# Patient Record
Sex: Male | Born: 1945 | Race: Black or African American | Hispanic: No | Marital: Single
Health system: Southern US, Academic
[De-identification: ages and names within clinical notes are randomized; demographics above are authoritative.]

## PROBLEM LIST (undated history)

## (undated) ENCOUNTER — Encounter: Attending: Nurse Practitioner | Primary: Nurse Practitioner

## (undated) ENCOUNTER — Telehealth
Attending: Student in an Organized Health Care Education/Training Program | Primary: Student in an Organized Health Care Education/Training Program

## (undated) ENCOUNTER — Ambulatory Visit

## (undated) ENCOUNTER — Encounter: Attending: Internal Medicine | Primary: Internal Medicine

## (undated) ENCOUNTER — Encounter

## (undated) ENCOUNTER — Ambulatory Visit: Attending: Family | Primary: Family

## (undated) ENCOUNTER — Telehealth: Attending: Nurse Practitioner | Primary: Nurse Practitioner

## (undated) ENCOUNTER — Ambulatory Visit: Payer: MEDICARE

## (undated) ENCOUNTER — Encounter: Attending: Hematology & Oncology | Primary: Hematology & Oncology

## (undated) ENCOUNTER — Telehealth: Attending: Hematology & Oncology | Primary: Hematology & Oncology

## (undated) ENCOUNTER — Telehealth

## (undated) ENCOUNTER — Encounter
Attending: Student in an Organized Health Care Education/Training Program | Primary: Student in an Organized Health Care Education/Training Program

## (undated) ENCOUNTER — Ambulatory Visit
Payer: MEDICARE | Attending: Student in an Organized Health Care Education/Training Program | Primary: Student in an Organized Health Care Education/Training Program

## (undated) ENCOUNTER — Inpatient Hospital Stay

## (undated) ENCOUNTER — Ambulatory Visit: Payer: MEDICARE | Attending: Registered" | Primary: Registered"

## (undated) ENCOUNTER — Ambulatory Visit: Attending: Clinical | Primary: Clinical

## (undated) ENCOUNTER — Telehealth: Attending: Children | Primary: Children

## (undated) ENCOUNTER — Telehealth: Attending: Nephrology | Primary: Nephrology

## (undated) ENCOUNTER — Ambulatory Visit: Payer: MEDICARE | Attending: Nurse Practitioner | Primary: Nurse Practitioner

## (undated) ENCOUNTER — Encounter: Attending: Clinical | Primary: Clinical

## (undated) ENCOUNTER — Encounter: Attending: Mental Health | Primary: Mental Health

## (undated) DIAGNOSIS — K2970 Gastritis, unspecified, without bleeding: Secondary | ICD-10-CM

## (undated) DIAGNOSIS — D649 Anemia, unspecified: Secondary | ICD-10-CM

## (undated) DIAGNOSIS — K269 Duodenal ulcer, unspecified as acute or chronic, without hemorrhage or perforation: Secondary | ICD-10-CM

## (undated) DIAGNOSIS — D509 Iron deficiency anemia, unspecified: Secondary | ICD-10-CM

## (undated) DIAGNOSIS — W3400XA Accidental discharge from unspecified firearms or gun, initial encounter: Secondary | ICD-10-CM

## (undated) DIAGNOSIS — K439 Ventral hernia without obstruction or gangrene: Secondary | ICD-10-CM

## (undated) DIAGNOSIS — K5790 Diverticulosis of intestine, part unspecified, without perforation or abscess without bleeding: Secondary | ICD-10-CM

## (undated) DIAGNOSIS — I10 Essential (primary) hypertension: Secondary | ICD-10-CM

## (undated) DIAGNOSIS — K922 Gastrointestinal hemorrhage, unspecified: Secondary | ICD-10-CM

## (undated) DIAGNOSIS — K2981 Duodenitis with bleeding: Secondary | ICD-10-CM

## (undated) HISTORY — PX: FACIAL RECONSTRUCTION SURGERY: SHX631

## (undated) HISTORY — PX: TRACHEOSTOMY: SUR1362

## (undated) HISTORY — PX: OTHER SURGICAL HISTORY: SHX169

---

## 2014-02-04 ENCOUNTER — Emergency Department (HOSPITAL_COMMUNITY)

## 2014-02-04 ENCOUNTER — Encounter (HOSPITAL_COMMUNITY): Payer: Self-pay | Admitting: Emergency Medicine

## 2014-02-04 ENCOUNTER — Emergency Department (HOSPITAL_COMMUNITY)
Admission: EM | Admit: 2014-02-04 | Discharge: 2014-02-04 | Disposition: A | Discharge status: Home / Self Care | Attending: Emergency Medicine | Admitting: Emergency Medicine

## 2014-02-04 DIAGNOSIS — Z9889 Other specified postprocedural states: Secondary | ICD-10-CM | POA: Insufficient documentation

## 2014-02-04 DIAGNOSIS — R109 Unspecified abdominal pain: Secondary | ICD-10-CM | POA: Diagnosis present

## 2014-02-04 DIAGNOSIS — Z87828 Personal history of other (healed) physical injury and trauma: Secondary | ICD-10-CM | POA: Diagnosis not present

## 2014-02-04 DIAGNOSIS — K439 Ventral hernia without obstruction or gangrene: Secondary | ICD-10-CM | POA: Insufficient documentation

## 2014-02-04 DIAGNOSIS — I10 Essential (primary) hypertension: Secondary | ICD-10-CM | POA: Diagnosis not present

## 2014-02-04 DIAGNOSIS — Z87891 Personal history of nicotine dependence: Secondary | ICD-10-CM | POA: Diagnosis not present

## 2014-02-04 DIAGNOSIS — Z79899 Other long term (current) drug therapy: Secondary | ICD-10-CM | POA: Insufficient documentation

## 2014-02-04 DIAGNOSIS — Z880 Allergy status to penicillin: Secondary | ICD-10-CM | POA: Insufficient documentation

## 2014-02-04 HISTORY — DX: Accidental discharge from unspecified firearms or gun, initial encounter: W34.00XA

## 2014-02-04 HISTORY — DX: Essential (primary) hypertension: I10

## 2014-02-04 LAB — CBC WITH DIFFERENTIAL/PLATELET
Basophils Absolute: 0 10*3/uL (ref 0.0–0.1)
Basophils Relative: 0 % (ref 0–1)
EOS PCT: 1 % (ref 0–5)
Eosinophils Absolute: 0.1 10*3/uL (ref 0.0–0.7)
HCT: 34.3 % — ABNORMAL LOW (ref 39.0–52.0)
HEMOGLOBIN: 11.9 g/dL — AB (ref 13.0–17.0)
LYMPHS ABS: 1.7 10*3/uL (ref 0.7–4.0)
Lymphocytes Relative: 16 % (ref 12–46)
MCH: 30.1 pg (ref 26.0–34.0)
MCHC: 34.7 g/dL (ref 30.0–36.0)
MCV: 86.8 fL (ref 78.0–100.0)
MONOS PCT: 7 % (ref 3–12)
Monocytes Absolute: 0.7 10*3/uL (ref 0.1–1.0)
Neutro Abs: 7.9 10*3/uL — ABNORMAL HIGH (ref 1.7–7.7)
Neutrophils Relative %: 76 % (ref 43–77)
PLATELETS: 307 10*3/uL (ref 150–400)
RBC: 3.95 MIL/uL — AB (ref 4.22–5.81)
RDW: 12.8 % (ref 11.5–15.5)
WBC: 10.5 10*3/uL (ref 4.0–10.5)

## 2014-02-04 LAB — BASIC METABOLIC PANEL
Anion gap: 9 (ref 5–15)
BUN: 20 mg/dL (ref 6–23)
CALCIUM: 9.2 mg/dL (ref 8.4–10.5)
CO2: 25 mEq/L (ref 19–32)
CREATININE: 1.27 mg/dL (ref 0.50–1.35)
Chloride: 96 mEq/L (ref 96–112)
GFR calc Af Amer: 66 mL/min — ABNORMAL LOW (ref >90)
GFR, EST NON AFRICAN AMERICAN: 57 mL/min — AB (ref >90)
GLUCOSE: 100 mg/dL — AB (ref 70–99)
Potassium: 4.8 mEq/L (ref 3.7–5.3)
Sodium: 130 mEq/L — ABNORMAL LOW (ref 137–147)

## 2014-02-04 MED ORDER — OXYCODONE-ACETAMINOPHEN 5-325 MG PO TABS: 2.0000 | ORAL_TABLET | ORAL | Status: DC | PRN

## 2014-02-04 MED ORDER — HYDROMORPHONE HCL PF 1 MG/ML IJ SOLN
1.0000 mg | Freq: Once | INTRAMUSCULAR | Status: AC
  Administered 2014-02-04: 1 mg via INTRAVENOUS
  Filled 2014-02-04: qty 1

## 2014-02-04 MED ORDER — SODIUM CHLORIDE 0.9 % IV BOLUS (SEPSIS)
1000.0000 mL | Freq: Once | INTRAVENOUS | Status: AC
  Administered 2014-02-04: 1000 mL via INTRAVENOUS

## 2014-02-04 NOTE — ED Notes (Signed)
Mid abd pain x 2 days with nausea.

## 2014-02-04 NOTE — Discharge Instructions (Signed)
Hernia A hernia happens when an organ inside your body pushes out through a weak spot in your belly (abdominal) wall. Most hernias get worse over time. They can often be pushed back into place (reduced). Surgery may be needed to repair hernias that cannot be pushed into place. HOME CARE  Keep doing normal activities.  Avoid lifting more than 10 pounds (4.5 kilograms).  Cough gently and avoid straining. Over time, these things will:  Increase your hernia size.  Irritate your hernia.  Break down hernia repairs.  Stop smoking.  Do not wear anything tight over your hernia. Do not keep the hernia in with an outside bandage.  Eat food that is high in fiber (fruit, vegetables, whole grains).  Drink enough fluids to keep your pee (urine) clear or pale yellow.  Take medicines to make your poop soft (stool softeners) if you cannot poop (constipated). GET HELP RIGHT AWAY IF:   You have a fever.  You have belly pain that gets worse.  You feel sick to your stomach (nauseous) and throw up (vomit).  Your skin starts to bulge out.  Your hernia turns a different color, feels hard, or is tender.  You have increased pain or puffiness (swelling) around the hernia.  You poop more or less often.  Your poop does not look the way normally does.  You have watery poop (diarrhea).  You cannot push the hernia back in place by applying gentle pressure while lying down. MAKE SURE YOU:   Understand these instructions.  Will watch your condition.  Will get help right away if you are not doing well or get worse. Document Released: 11/03/2009 Document Revised: 08/08/2011 Document Reviewed: 11/03/2009 [facility] Patient Information 2015 [place], [place]. This information is not intended to replace advice given to you by your health care provider. Make sure you discuss any questions you have with your health care provider.  Recommend followup with general surgeon to evaluate hernia. Pain medication.  Return if vomiting or unable to reduce hernia.
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
## 2014-06-12 HISTORY — PX: HERNIA REPAIR: SHX51

## 2014-06-29 ENCOUNTER — Inpatient Hospital Stay (HOSPITAL_COMMUNITY)
Admission: EM | Admit: 2014-06-29 | Discharge: 2014-07-02 | DRG: 378 | Disposition: A | Discharge status: Home / Self Care | Payer: Medicaid Other | Attending: Internal Medicine | Admitting: Internal Medicine

## 2014-06-29 ENCOUNTER — Emergency Department (HOSPITAL_COMMUNITY): Payer: Medicaid Other

## 2014-06-29 ENCOUNTER — Encounter (HOSPITAL_COMMUNITY): Payer: Self-pay | Admitting: Emergency Medicine

## 2014-06-29 DIAGNOSIS — K264 Chronic or unspecified duodenal ulcer with hemorrhage: Secondary | ICD-10-CM | POA: Diagnosis not present

## 2014-06-29 DIAGNOSIS — R109 Unspecified abdominal pain: Secondary | ICD-10-CM | POA: Diagnosis present

## 2014-06-29 DIAGNOSIS — Z880 Allergy status to penicillin: Secondary | ICD-10-CM

## 2014-06-29 DIAGNOSIS — K5730 Diverticulosis of large intestine without perforation or abscess without bleeding: Secondary | ICD-10-CM | POA: Diagnosis present

## 2014-06-29 DIAGNOSIS — D500 Iron deficiency anemia secondary to blood loss (chronic): Secondary | ICD-10-CM | POA: Diagnosis not present

## 2014-06-29 DIAGNOSIS — Z23 Encounter for immunization: Secondary | ICD-10-CM | POA: Diagnosis not present

## 2014-06-29 DIAGNOSIS — K2981 Duodenitis with bleeding: Principal | ICD-10-CM | POA: Diagnosis present

## 2014-06-29 DIAGNOSIS — K2970 Gastritis, unspecified, without bleeding: Secondary | ICD-10-CM | POA: Diagnosis present

## 2014-06-29 DIAGNOSIS — K5790 Diverticulosis of intestine, part unspecified, without perforation or abscess without bleeding: Secondary | ICD-10-CM | POA: Diagnosis present

## 2014-06-29 DIAGNOSIS — D649 Anemia, unspecified: Secondary | ICD-10-CM | POA: Insufficient documentation

## 2014-06-29 DIAGNOSIS — D62 Acute posthemorrhagic anemia: Secondary | ICD-10-CM | POA: Diagnosis present

## 2014-06-29 DIAGNOSIS — K922 Gastrointestinal hemorrhage, unspecified: Secondary | ICD-10-CM

## 2014-06-29 DIAGNOSIS — K439 Ventral hernia without obstruction or gangrene: Secondary | ICD-10-CM | POA: Diagnosis present

## 2014-06-29 DIAGNOSIS — Z87891 Personal history of nicotine dependence: Secondary | ICD-10-CM

## 2014-06-29 DIAGNOSIS — R1033 Periumbilical pain: Secondary | ICD-10-CM | POA: Diagnosis not present

## 2014-06-29 DIAGNOSIS — K2980 Duodenitis without bleeding: Secondary | ICD-10-CM | POA: Diagnosis present

## 2014-06-29 DIAGNOSIS — Z79899 Other long term (current) drug therapy: Secondary | ICD-10-CM | POA: Diagnosis not present

## 2014-06-29 DIAGNOSIS — K269 Duodenal ulcer, unspecified as acute or chronic, without hemorrhage or perforation: Secondary | ICD-10-CM | POA: Diagnosis present

## 2014-06-29 DIAGNOSIS — K648 Other hemorrhoids: Secondary | ICD-10-CM | POA: Diagnosis present

## 2014-06-29 DIAGNOSIS — Z9889 Other specified postprocedural states: Secondary | ICD-10-CM | POA: Diagnosis not present

## 2014-06-29 DIAGNOSIS — D509 Iron deficiency anemia, unspecified: Secondary | ICD-10-CM | POA: Diagnosis present

## 2014-06-29 DIAGNOSIS — I10 Essential (primary) hypertension: Secondary | ICD-10-CM | POA: Diagnosis present

## 2014-06-29 HISTORY — DX: Duodenal ulcer, unspecified as acute or chronic, without hemorrhage or perforation: K26.9

## 2014-06-29 HISTORY — DX: Ventral hernia without obstruction or gangrene: K43.9

## 2014-06-29 HISTORY — DX: Iron deficiency anemia, unspecified: D50.9

## 2014-06-29 HISTORY — DX: Gastrointestinal hemorrhage, unspecified: K92.2

## 2014-06-29 HISTORY — DX: Gastritis, unspecified, without bleeding: K29.70

## 2014-06-29 HISTORY — DX: Anemia, unspecified: D64.9

## 2014-06-29 HISTORY — DX: Duodenitis with bleeding: K29.81

## 2014-06-29 HISTORY — DX: Diverticulosis of intestine, part unspecified, without perforation or abscess without bleeding: K57.90

## 2014-06-29 LAB — RETICULOCYTES
RBC.: 2.99 MIL/uL — ABNORMAL LOW (ref 4.22–5.81)
RETIC CT PCT: 1.5 % (ref 0.4–3.1)
Retic Count, Absolute: 44.9 10*3/uL (ref 19.0–186.0)

## 2014-06-29 LAB — CBC WITH DIFFERENTIAL/PLATELET
BASOS ABS: 0 10*3/uL (ref 0.0–0.1)
Basophils Relative: 0 % (ref 0–1)
EOS ABS: 0 10*3/uL (ref 0.0–0.7)
Eosinophils Relative: 1 % (ref 0–5)
HCT: 25.1 % — ABNORMAL LOW (ref 39.0–52.0)
HEMOGLOBIN: 8.2 g/dL — AB (ref 13.0–17.0)
Lymphocytes Relative: 26 % (ref 12–46)
Lymphs Abs: 1.4 10*3/uL (ref 0.7–4.0)
MCH: 27.4 pg (ref 26.0–34.0)
MCHC: 32.7 g/dL (ref 30.0–36.0)
MCV: 83.9 fL (ref 78.0–100.0)
Monocytes Absolute: 0.3 10*3/uL (ref 0.1–1.0)
Monocytes Relative: 5 % (ref 3–12)
NEUTROS ABS: 3.6 10*3/uL (ref 1.7–7.7)
Neutrophils Relative %: 68 % (ref 43–77)
PLATELETS: 432 10*3/uL — AB (ref 150–400)
RBC: 2.99 MIL/uL — AB (ref 4.22–5.81)
RDW: 15 % (ref 11.5–15.5)
WBC: 5.3 10*3/uL (ref 4.0–10.5)

## 2014-06-29 LAB — I-STAT CG4 LACTIC ACID, ED: Lactic Acid, Venous: 0.46 mmol/L — ABNORMAL LOW (ref 0.5–2.0)

## 2014-06-29 LAB — LIPASE, BLOOD: Lipase: 20 U/L (ref 11–59)

## 2014-06-29 LAB — COMPREHENSIVE METABOLIC PANEL
ALT: 14 U/L (ref 0–53)
AST: 13 U/L (ref 0–37)
Albumin: 3.5 g/dL (ref 3.5–5.2)
Alkaline Phosphatase: 63 U/L (ref 39–117)
Anion gap: 4 — ABNORMAL LOW (ref 5–15)
BUN: 10 mg/dL (ref 6–23)
CALCIUM: 8.8 mg/dL (ref 8.4–10.5)
CO2: 27 mmol/L (ref 19–32)
CREATININE: 1.1 mg/dL (ref 0.50–1.35)
Chloride: 100 mmol/L (ref 96–112)
GFR calc Af Amer: 79 mL/min — ABNORMAL LOW (ref >90)
GFR calc non Af Amer: 68 mL/min — ABNORMAL LOW (ref >90)
GLUCOSE: 110 mg/dL — AB (ref 70–99)
POTASSIUM: 3.7 mmol/L (ref 3.5–5.1)
SODIUM: 131 mmol/L — AB (ref 135–145)
Total Bilirubin: 0.6 mg/dL (ref 0.3–1.2)
Total Protein: 7.2 g/dL (ref 6.0–8.3)

## 2014-06-29 LAB — SAMPLE TO BLOOD BANK

## 2014-06-29 MED ORDER — SODIUM CHLORIDE 0.9 % IV SOLN: 8.0000 mg/h | INTRAVENOUS | Status: DC

## 2014-06-29 MED ORDER — PANTOPRAZOLE SODIUM 40 MG IV SOLR
40.0000 mg | Freq: Two times a day (BID) | INTRAVENOUS | Status: DC
  Administered 2014-06-29 – 2014-07-01 (×4): 40 mg via INTRAVENOUS
  Filled 2014-06-29 (×4): qty 40

## 2014-06-29 MED ORDER — SODIUM CHLORIDE 0.9 % IV SOLN
INTRAVENOUS | Status: DC
  Administered 2014-06-29 – 2014-07-02 (×3): via INTRAVENOUS

## 2014-06-29 MED ORDER — ONDANSETRON HCL 4 MG/2ML IJ SOLN: 4.0000 mg | Freq: Four times a day (QID) | INTRAMUSCULAR | Status: DC | PRN

## 2014-06-29 MED ORDER — IOHEXOL 300 MG/ML  SOLN
50.0000 mL | Freq: Once | INTRAMUSCULAR | Status: AC | PRN
  Administered 2014-06-29: 50 mL via ORAL

## 2014-06-29 MED ORDER — SODIUM CHLORIDE 0.9 % IV SOLN: INTRAVENOUS | Status: DC

## 2014-06-29 MED ORDER — ONDANSETRON HCL 4 MG/2ML IJ SOLN
4.0000 mg | INTRAMUSCULAR | Status: DC | PRN
  Administered 2014-06-29: 4 mg via INTRAVENOUS
  Filled 2014-06-29: qty 2

## 2014-06-29 MED ORDER — ATENOLOL 25 MG PO TABS
25.0000 mg | ORAL_TABLET | Freq: Every day | ORAL | Status: DC
  Administered 2014-06-30 – 2014-07-02 (×3): 25 mg via ORAL
  Filled 2014-06-29 (×3): qty 1

## 2014-06-29 MED ORDER — HYDROCHLOROTHIAZIDE 25 MG PO TABS: 25.0000 mg | ORAL_TABLET | Freq: Every day | ORAL | Status: DC

## 2014-06-29 MED ORDER — HYDROCHLOROTHIAZIDE 25 MG PO TABS
25.0000 mg | ORAL_TABLET | Freq: Every day | ORAL | Status: DC
  Administered 2014-06-30 – 2014-07-02 (×3): 25 mg via ORAL
  Filled 2014-06-29 (×3): qty 1

## 2014-06-29 MED ORDER — ONDANSETRON HCL 4 MG PO TABS: 4.0000 mg | ORAL_TABLET | Freq: Four times a day (QID) | ORAL | Status: DC | PRN

## 2014-06-29 MED ORDER — MORPHINE SULFATE 4 MG/ML IJ SOLN
4.0000 mg | INTRAMUSCULAR | Status: DC | PRN
  Administered 2014-06-29: 4 mg via INTRAVENOUS
  Filled 2014-06-29: qty 1

## 2014-06-29 MED ORDER — SODIUM CHLORIDE 0.9 % IV SOLN
80.0000 mg | Freq: Once | INTRAVENOUS | Status: AC
  Administered 2014-06-29: 80 mg via INTRAVENOUS
  Filled 2014-06-29: qty 80

## 2014-06-29 MED ORDER — PNEUMOCOCCAL VAC POLYVALENT 25 MCG/0.5ML IJ INJ
0.5000 mL | INJECTION | INTRAMUSCULAR | Status: AC
  Administered 2014-06-30: 0.5 mL via INTRAMUSCULAR
  Filled 2014-06-29: qty 0.5

## 2014-06-29 MED ORDER — PANTOPRAZOLE SODIUM 40 MG IV SOLR
INTRAVENOUS | Status: AC
  Filled 2014-06-29: qty 80

## 2014-06-29 MED ORDER — HYOSCYAMINE SULFATE 0.125 MG PO TABS
0.2500 mg | ORAL_TABLET | Freq: Two times a day (BID) | ORAL | Status: DC
  Administered 2014-06-29 – 2014-07-01 (×4): 0.25 mg via ORAL
  Filled 2014-06-29 (×6): qty 2

## 2014-06-29 MED ORDER — DOCUSATE SODIUM 100 MG PO CAPS
100.0000 mg | ORAL_CAPSULE | Freq: Two times a day (BID) | ORAL | Status: DC
Start: 2014-06-29 — End: 2014-07-02
  Administered 2014-06-29 – 2014-07-02 (×6): 100 mg via ORAL
  Filled 2014-06-29 (×6): qty 1

## 2014-06-29 MED ORDER — IOHEXOL 300 MG/ML  SOLN
100.0000 mL | Freq: Once | INTRAMUSCULAR | Status: AC | PRN
  Administered 2014-06-29: 100 mL via INTRAVENOUS

## 2014-06-29 NOTE — ED Notes (Signed)
Pt reports return of post surgical pain after he was taken off his pain meds on Tues.
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
## 2014-06-29 NOTE — ED Notes (Addendum)
Pt finished drinking contrast. CT aware. CT reports is bringing one additional bottle of contrast.

## 2014-06-29 NOTE — ED Notes (Signed)
CT aware pt finished drinking second bottle of oral contrast. CT reports pt to be scanned 1745.

## 2014-06-29 NOTE — ED Notes (Signed)
POC occult blood obtained by EDP provider. POC occult blood negative.
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
## 2014-06-30 ENCOUNTER — Encounter (HOSPITAL_COMMUNITY): Payer: Self-pay | Admitting: Gastroenterology

## 2014-06-30 DIAGNOSIS — D509 Iron deficiency anemia, unspecified: Secondary | ICD-10-CM | POA: Diagnosis present

## 2014-06-30 DIAGNOSIS — I10 Essential (primary) hypertension: Secondary | ICD-10-CM | POA: Diagnosis present

## 2014-06-30 DIAGNOSIS — R109 Unspecified abdominal pain: Secondary | ICD-10-CM | POA: Diagnosis present

## 2014-06-30 LAB — COMPREHENSIVE METABOLIC PANEL
ALT: 11 U/L (ref 0–53)
ANION GAP: 5 (ref 5–15)
AST: 8 U/L (ref 0–37)
Albumin: 3 g/dL — ABNORMAL LOW (ref 3.5–5.2)
Alkaline Phosphatase: 57 U/L (ref 39–117)
BILIRUBIN TOTAL: 0.5 mg/dL (ref 0.3–1.2)
BUN: 12 mg/dL (ref 6–23)
CALCIUM: 8.6 mg/dL (ref 8.4–10.5)
CO2: 26 mmol/L (ref 19–32)
Chloride: 102 mmol/L (ref 96–112)
Creatinine, Ser: 1.19 mg/dL (ref 0.50–1.35)
GFR calc Af Amer: 72 mL/min — ABNORMAL LOW (ref >90)
GFR calc non Af Amer: 62 mL/min — ABNORMAL LOW (ref >90)
GLUCOSE: 103 mg/dL — AB (ref 70–99)
Potassium: 4.1 mmol/L (ref 3.5–5.1)
SODIUM: 133 mmol/L — AB (ref 135–145)
TOTAL PROTEIN: 6.3 g/dL (ref 6.0–8.3)

## 2014-06-30 LAB — FERRITIN: FERRITIN: 8 ng/mL — AB (ref 22–322)

## 2014-06-30 LAB — IRON AND TIBC
IRON: 66 ug/dL (ref 42–165)
Saturation Ratios: 19 % — ABNORMAL LOW (ref 20–55)
TIBC: 356 ug/dL (ref 215–435)
UIBC: 290 ug/dL (ref 125–400)

## 2014-06-30 LAB — CBC
HCT: 23.2 % — ABNORMAL LOW (ref 39.0–52.0)
Hemoglobin: 7.6 g/dL — ABNORMAL LOW (ref 13.0–17.0)
MCH: 27.5 pg (ref 26.0–34.0)
MCHC: 32.8 g/dL (ref 30.0–36.0)
MCV: 84.1 fL (ref 78.0–100.0)
Platelets: 394 10*3/uL (ref 150–400)
RBC: 2.76 MIL/uL — ABNORMAL LOW (ref 4.22–5.81)
RDW: 15.1 % (ref 11.5–15.5)
WBC: 5 10*3/uL (ref 4.0–10.5)

## 2014-06-30 LAB — FOLATE: FOLATE: 8.6 ng/mL

## 2014-06-30 LAB — VITAMIN B12: VITAMIN B 12: 386 pg/mL (ref 211–911)

## 2014-06-30 LAB — ABO/RH: ABO/RH(D): A POS

## 2014-06-30 LAB — PREPARE RBC (CROSSMATCH)

## 2014-06-30 MED ORDER — SODIUM CHLORIDE 0.9 % IV SOLN
Freq: Once | INTRAVENOUS | Status: AC
  Administered 2014-06-30: 16:00:00 via INTRAVENOUS

## 2014-06-30 MED ORDER — POLYETHYLENE GLYCOL 3350 17 G PO PACK
17.0000 g | PACK | Freq: Two times a day (BID) | ORAL | Status: AC
  Administered 2014-06-30 – 2014-07-01 (×4): 17 g via ORAL
  Filled 2014-06-30 (×4): qty 1

## 2014-06-30 MED ORDER — PEG 3350-KCL-NA BICARB-NACL 420 G PO SOLR
2000.0000 mL | Freq: Once | ORAL | Status: AC
  Administered 2014-06-30: 2000 mL via ORAL
  Filled 2014-06-30: qty 4000

## 2014-06-30 MED ORDER — PEG 3350-KCL-NA BICARB-NACL 420 G PO SOLR
2000.0000 mL | Freq: Once | ORAL | Status: AC
  Administered 2014-07-01: 2000 mL via ORAL

## 2014-06-30 NOTE — Progress Notes (Signed)
[email or web address] MD notified, pt's Hgb 7.6 this morning.

## 2014-06-30 NOTE — Care Management Note (Addendum)
    Page 1 of 1   07/02/2014     2:06:59 PM CARE MANAGEMENT NOTE 07/02/2014  Patient:  Jeffery Clark,Jeffery Clark   Account Number:  [id number]  Date Initiated:  06/30/2014  Documentation initiated by:  Sharrie RothmanBLACKWELL,Nicolette Gieske C  Subjective/Objective Assessment:   [facility] with abd [facility]. Pt will return to facility at discharge.     Action/Plan:   No CM needs noted. Will call prison RN at discharge for clearance to return to facility.   Anticipated DC Date:  07/03/2014   Anticipated DC Plan:  [facility]      DC Planning Services  CM consult      Choice offered to / List presented to:             Status of service:  Completed, signed off Medicare Important Message given?   (If response is "NO", the following Medicare IM given date fields will be blank) Date Medicare IM given:   Medicare IM given by:   Date Additional Medicare IM given:   Additional Medicare IM given by:    Discharge Disposition:  [facility]  Per UR Regulation:    If discussed at Long Length of Stay Meetings, dates discussed:    Comments:  07/02/14 1400 Arlyss Queenammy Xzaiver Vayda, RN BSN CM Pt to be discharged back to prison today. [facility] and she has given clearance for pt to return to same correctional [facility] today. Discharge summary faxed to prison per their request to get medications filled ASAP. No other CM needs noted.  06/30/14 1230 Arlyss Queenammy Sybrina Laning, RN BSN CM
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
## 2014-06-30 NOTE — Progress Notes (Signed)
[facility] PROGRESS NOTE  Glynda JaegerRoy XXXAtwater AOZ:[id number] DOB: 11-14-1945 DOA: 06/29/2014 PCP: No PCP Per Patient  Assessment/Plan: 1. Duodenits with possible duodenal ulcer. The site of his pain is not quite consistent with duodenal ulcer pain location but the CT abdominal scan finding is clearly abnormal. Pain resolved this afternoon. Will start clear liquids. Continue  intravenous Protonix.  Await GI input 2. Anemia, normocytic. Platelet count elevated. Anemia panel in process. No NSAID use. No melena. No hx colonoscopy. Will transfuse 1 unit PRBC's. Await GI input. 3. HTN: controlled. Home meds include BB and HCTZ. Holding HCTZ  Code Status: full Family Communication: none present Disposition Plan: home when   Consultants:  GI  Procedures:  none  Antibiotics:  none  HPI/Subjective: Feeling better. Denies pain. Requesting liquids  Objective: Filed Vitals:   06/30/14 0624  BP: 111/58  Pulse: 55  Temp: 98.6 F (37 C)  Resp: 20    Intake/Output Summary (Last 24 hours) at 06/30/14 1412 Last data filed at 06/30/14 0800  Gross per 24 hour  Intake 495.83 ml  Output      0 ml  Net 495.83 ml   Filed Weights   06/29/14 1523 06/29/14 2052  Weight: 104.327 kg (230 lb) 100.608 kg (221 lb 12.8 oz)    Exam:   General:  Well nourished comfortable  Cardiovascular: RRR No MGR no LE edema  Respiratory: normal effort BS clear bilaterally  Abdomen: soft +BS non-distended non-tender  Musculoskeletal: no clubbing or cyanosis   Data Reviewed: Basic Metabolic Panel:  Recent Labs Lab 06/29/14 1615 06/30/14 0704  NA 131* 133*  K 3.7 4.1  CL 100 102  CO2 27 26  GLUCOSE 110* 103*  BUN 10 12  CREATININE 1.10 1.19  CALCIUM 8.8 8.6   Liver Function Tests:  Recent Labs Lab 06/29/14 1615 06/30/14 0704  AST 13 8  ALT 14 11  ALKPHOS 63 57  BILITOT 0.6 0.5  PROT 7.2 6.3  ALBUMIN 3.5 3.0*    Recent Labs Lab 06/29/14 1615  LIPASE 20   No results for  input(s): AMMONIA in the last 168 hours. CBC:  Recent Labs Lab 06/29/14 1615 06/30/14 0704  WBC 5.3 5.0  NEUTROABS 3.6  --   HGB 8.2* 7.6*  HCT 25.1* 23.2*  MCV 83.9 84.1  PLT 432* 394   Cardiac Enzymes: No results for input(s): CKTOTAL, CKMB, CKMBINDEX, TROPONINI in the last 168 hours. BNP (last 3 results) No results for input(s): PROBNP in the last 8760 hours. CBG: No results for input(s): GLUCAP in the last 168 hours.  No results found for this or any previous visit (from the past 240 hour(s)).   Studies: Ct Abdomen Pelvis W Contrast  06/29/2014   CLINICAL DATA:  Mid abdominal pain beginning 06/28/2014. Status post umbilical hernia repair is 06/12/2014.  EXAM: CT ABDOMEN AND PELVIS WITH CONTRAST  TECHNIQUE: Multidetector CT imaging of the abdomen and pelvis was performed using the standard protocol following bolus administration of intravenous contrast.  CONTRAST:  50 mL OMNIPAQUE IOHEXOL 300 MG/ML SOLN, 100 mL OMNIPAQUE IOHEXOL 300 MG/ML SOLN  COMPARISON:  None.  FINDINGS: The lung bases are clear.  No pleural or pericardial effusion.  The walls of the first portion the duodenum are markedly thickened with infiltration of surrounding fat. No free air is identified. There is no small bowel obstruction. A large volume of stool is present throughout the colon. The colon is otherwise unremarkable. The appendix appears normal.  The gallbladder, spleen and adrenal  glands appear normal. Small low attenuating lesions in the kidneys bilaterally are identified and likely represents cysts. Some are too small to definitively characterize. A few small low attenuating lesions are also identified in the liver which likely represent cysts.  There is some stranding of fat in the anterior abdominal wall at the umbilicus consistent with history of hernia repair. No fluid collection or other complicating feature is identified. No lymphadenopathy or intra-abdominal fluid is identified.  No lytic or sclerotic  bony lesion is seen. Lower lumbar spondylosis is noted.  IMPRESSION: Marked thickening of the walls of the first portion of the duodenum consistent with duodenitis and possible ulceration. No perforation is seen.  Status post umbilical hernia repair without complicating feature.  Large stool burden throughout the colon.   Electronically Signed   By: Drusilla Kanner M.D.   On: 06/29/2014 18:23    Scheduled Meds: . sodium chloride   Intravenous Once  . atenolol  25 mg Oral Daily  . docusate sodium  100 mg Oral BID  . hydrochlorothiazide  25 mg Oral Daily  . hyoscyamine  0.25 mg Oral BID  . pantoprazole (PROTONIX) IV  40 mg Intravenous Q12H  . polyethylene glycol  17 g Oral BID   Continuous Infusions: . sodium chloride 50 mL/hr at 06/29/14 2045    Active Problems:   Duodenitis   Absolute anemia   Hypertension   Abdominal pain    Time spent: 34 minutes    [facility] M  [facility] Pager [phone]. If 7PM-7AM, please contact night-coverage at [email or web address], password [facility] 06/30/2014, 2:12 PM  LOS: 1 day

## 2014-06-30 NOTE — Progress Notes (Signed)
UR chart review completed.  

## 2014-07-01 ENCOUNTER — Encounter (HOSPITAL_COMMUNITY): Admission: EM | Payer: Self-pay | Source: Home / Self Care | Attending: Internal Medicine

## 2014-07-01 DIAGNOSIS — K922 Gastrointestinal hemorrhage, unspecified: Secondary | ICD-10-CM | POA: Diagnosis present

## 2014-07-01 DIAGNOSIS — D500 Iron deficiency anemia secondary to blood loss (chronic): Secondary | ICD-10-CM

## 2014-07-01 DIAGNOSIS — K269 Duodenal ulcer, unspecified as acute or chronic, without hemorrhage or perforation: Secondary | ICD-10-CM

## 2014-07-01 HISTORY — DX: Gastrointestinal hemorrhage, unspecified: K92.2

## 2014-07-01 HISTORY — DX: Duodenal ulcer, unspecified as acute or chronic, without hemorrhage or perforation: K26.9

## 2014-07-01 HISTORY — PX: ESOPHAGOGASTRODUODENOSCOPY: SHX5428

## 2014-07-01 HISTORY — PX: COLONOSCOPY: SHX5424

## 2014-07-01 LAB — TYPE AND SCREEN
ABO/RH(D): A POS
Antibody Screen: NEGATIVE
UNIT DIVISION: 0

## 2014-07-01 LAB — BASIC METABOLIC PANEL
Anion gap: 7 (ref 5–15)
BUN: 10 mg/dL (ref 6–23)
CALCIUM: 8.6 mg/dL (ref 8.4–10.5)
CO2: 27 mmol/L (ref 19–32)
Chloride: 101 mmol/L (ref 96–112)
Creatinine, Ser: 1.04 mg/dL (ref 0.50–1.35)
GFR calc non Af Amer: 73 mL/min — ABNORMAL LOW (ref >90)
GFR, EST AFRICAN AMERICAN: 84 mL/min — AB (ref >90)
Glucose, Bld: 95 mg/dL (ref 70–99)
Potassium: 4 mmol/L (ref 3.5–5.1)
Sodium: 135 mmol/L (ref 135–145)

## 2014-07-01 LAB — CBC
HCT: 24.9 % — ABNORMAL LOW (ref 39.0–52.0)
Hemoglobin: 8.3 g/dL — ABNORMAL LOW (ref 13.0–17.0)
MCH: 28.2 pg (ref 26.0–34.0)
MCHC: 33.3 g/dL (ref 30.0–36.0)
MCV: 84.7 fL (ref 78.0–100.0)
PLATELETS: 378 10*3/uL (ref 150–400)
RBC: 2.94 MIL/uL — AB (ref 4.22–5.81)
RDW: 15 % (ref 11.5–15.5)
WBC: 4.5 10*3/uL (ref 4.0–10.5)

## 2014-07-01 SURGERY — COLONOSCOPY
Anesthesia: Moderate Sedation

## 2014-07-01 MED ORDER — LIDOCAINE VISCOUS 2 % MT SOLN
10.0000 mL | Freq: Three times a day (TID) | OROMUCOSAL | Status: DC
  Administered 2014-07-02: 10 mL via OROMUCOSAL
  Filled 2014-07-01 (×3): qty 15

## 2014-07-01 MED ORDER — SODIUM CHLORIDE 0.9 % IV SOLN
INTRAVENOUS | Status: DC
  Administered 2014-07-01: 1000 mL via INTRAVENOUS

## 2014-07-01 MED ORDER — STERILE WATER FOR IRRIGATION IR SOLN
Status: DC | PRN
  Administered 2014-07-01: 14:00:00

## 2014-07-01 MED ORDER — MEPERIDINE HCL 100 MG/ML IJ SOLN
INTRAMUSCULAR | Status: DC | PRN
  Administered 2014-07-01 (×3): 25 mg via INTRAVENOUS

## 2014-07-01 MED ORDER — MIDAZOLAM HCL 5 MG/5ML IJ SOLN
INTRAMUSCULAR | Status: AC
  Filled 2014-07-01: qty 10

## 2014-07-01 MED ORDER — PANTOPRAZOLE SODIUM 40 MG PO TBEC
40.0000 mg | DELAYED_RELEASE_TABLET | Freq: Two times a day (BID) | ORAL | Status: DC
  Administered 2014-07-02: 40 mg via ORAL
  Filled 2014-07-01 (×2): qty 1

## 2014-07-01 MED ORDER — LIDOCAINE VISCOUS 2 % MT SOLN
OROMUCOSAL | Status: DC | PRN
  Administered 2014-07-01: 4 mL via OROMUCOSAL
  Administered 2014-07-01: 3 mL via OROMUCOSAL

## 2014-07-01 MED ORDER — HYDROCODONE-ACETAMINOPHEN 5-325 MG PO TABS: 1.0000 | ORAL_TABLET | Freq: Four times a day (QID) | ORAL | Status: DC | PRN

## 2014-07-01 MED ORDER — SODIUM CHLORIDE 0.9 % IJ SOLN
INTRAMUSCULAR | Status: AC
  Filled 2014-07-01: qty 3

## 2014-07-01 MED ORDER — PROMETHAZINE HCL 25 MG/ML IJ SOLN
INTRAMUSCULAR | Status: AC
  Filled 2014-07-01: qty 1

## 2014-07-01 MED ORDER — PROMETHAZINE HCL 25 MG/ML IJ SOLN
INTRAMUSCULAR | Status: DC | PRN
  Administered 2014-07-01: 12.5 mg via INTRAVENOUS

## 2014-07-01 MED ORDER — MEPERIDINE HCL 100 MG/ML IJ SOLN
INTRAMUSCULAR | Status: AC
  Filled 2014-07-01: qty 2

## 2014-07-01 MED ORDER — LIDOCAINE VISCOUS 2 % MT SOLN
OROMUCOSAL | Status: AC
  Filled 2014-07-01: qty 15

## 2014-07-01 MED ORDER — MIDAZOLAM HCL 5 MG/5ML IJ SOLN
INTRAMUSCULAR | Status: DC | PRN
  Administered 2014-07-01 (×2): 1 mg via INTRAVENOUS
  Administered 2014-07-01 (×2): 2 mg via INTRAVENOUS

## 2014-07-01 NOTE — Op Note (Signed)
[facility] [address] [place], [id number]   COLONOSCOPY PROCEDURE REPORT  PATIENT: Jeffery Clark, Jeffery Clark  MR#: [id number] BIRTHDATE: 12-10-1945 , 68  yrs. old GENDER: male ENDOSCOPIST: [place] L Elleah Hemsley, MD REFERRED BY: PROCEDURE DATE:  07/01/2014 PROCEDURE:   Colonoscopy, diagnostic INDICATIONS:anemia, non-specific. MEDICATIONS: Versed 5 mg IV and Demerol 50 mg IV  DESCRIPTION OF PROCEDURE:    Physical exam was performed.  Informed consent was obtained from the patient after explaining the benefits, risks, and alternatives to procedure.  The patient was connected to monitor and placed in left lateral position. Continuous oxygen was provided by nasal cannula and IV medicine administered through an indwelling cannula.  After administration of sedation and rectal exam, the patients rectum was intubated and the EC-[id number] ([id number]  colonoscope was advanced under direct visualization to the cecum.  The scope was removed slowly by carefully examining the color, texture, anatomy, and integrity mucosa on the way out.  The patient was recovered in endoscopy and discharged home in satisfactory condition.    COLON FINDINGS: There was moderate diverticulosis noted throughout the entire examined colon with associated muscular hypertrophy and tortuosity.  , The colon was redundant.  Manual abdominal counter-pressure was used to reach the cecum.  The patient was moved on to their back to reach the cecum, The examination was otherwise normal.  , and Moderate sized internal hemorrhoids were found.  PREP QUALITY: excellent.  CECAL W/D TIME: 14       minutes COMPLICATIONS: None  ENDOSCOPIC IMPRESSION: 1.   Moderate diverticulosis throughout the entire examined colon 2.   The LEFT colon IS Redundant 3.   Moderate sized internal hemorrhoids 4. NO SOURCE FOR ANEMIA IDENTIFIED.  RECOMMENDATIONS: PROCEED TO EGD HIGH FIBER DIET NEXT TCS IN 10 YEARS WITH AN  OVERTUBE    _______________________________ eSigned[place]:  Kristofer Schaffert L Tristain Daily, MD 07/01/2014 2:59 PM    CPT CODES: ICD CODES:  The ICD and CPT codes recommended by this software are interpretations from the data that the clinical staff has captured with the software.  The verification of the translation of this report to the ICD and CPT codes and modifiers is the sole responsibility of the health care institution and practicing physician where this report was generated.  [employer] will not be held responsible for the validity of the ICD and CPT codes included on this report.  AMA assumes no liability for data contained or not contained herein. CPT is a Publishing rights managerregistered trademark of the [employer].

## 2014-07-01 NOTE — Progress Notes (Signed)
[facility] PROGRESS NOTE  Jeffery Clark ZOX:[id number] DOB: 1945-06-03 DOA: 06/29/2014 PCP: No PCP Per Patient  Assessment/Plan: 1. Duodenits with possible duodenal ulcer.  Pain resolved. CT concerning for ulceration. Continue intravenous Protonix. evaluated by GI who recommend colonoscopy and EGD in setting of duodenitis and IDA.  2. Anemia, normocytic.  Anemia panel consistent with IDA. No NSAID use. No melena. No hx colonoscopy. Hg 8.3 s/p 1 unit PRBC's.  Appreciate GI input. 3. HTN: controlled. Home meds include BB and HCTZ. Holding HCTZ  Code Status: full Family Communication: none present Disposition Plan: back to facility   Consultants:  GI  Procedures:  EGD  colonoscopy  Antibiotics:  none  HPI/Subjective: Denies pain/discomfort. No stools.   Objective: Filed Vitals:   07/01/14 0435  BP: 121/63  Pulse: 64  Temp: 98.4 F (36.9 C)  Resp: 18    Intake/Output Summary (Last 24 hours) at 07/01/14 0944 Last data filed at 06/30/14 1830  Gross per 24 hour  Intake    570 ml  Output      0 ml  Net    570 ml   Filed Weights   06/29/14 1523 06/29/14 2052  Weight: 104.327 kg (230 lb) 100.608 kg (221 lb 12.8 oz)    Exam:   General:  Well nourished calm  Cardiovascular: RRR No MGR No LE edema  Respiratory: normal effort BS clear bilaterally  Abdomen: non-distended, non-tender +BS  Musculoskeletal: joints without swelling/erythema   Data Reviewed: Basic Metabolic Panel:  Recent Labs Lab 06/29/14 1615 06/30/14 0704 07/01/14 0656  NA 131* 133* 135  K 3.7 4.1 4.0  CL 100 102 101  CO2 GLUCOSE 110* 103* 95  BUN CREATININE 1.10 1.19 1.04  CALCIUM 8.8 8.6 8.6   Liver Function Tests:  Recent Labs Lab 06/29/14 1615 06/30/14 0704  AST 13 8  ALT 14 11  ALKPHOS 63 57  BILITOT 0.6 0.5  PROT 7.2 6.3  ALBUMIN 3.5 3.0*    Recent Labs Lab 06/29/14 1615  LIPASE 20   No results for input(s): AMMONIA in the last  168 hours. CBC:  Recent Labs Lab 06/29/14 1615 06/30/14 0704 07/01/14 0656  WBC 5.3 5.0 4.5  NEUTROABS 3.6  --   --   HGB 8.2* 7.6* 8.3*  HCT 25.1* 23.2* 24.9*  MCV 83.9 84.1 84.7  PLT 432* 394 378   Cardiac Enzymes: No results for input(s): CKTOTAL, CKMB, CKMBINDEX, TROPONINI in the last 168 hours. BNP (last 3 results) No results for input(s): BNP in the last 8760 hours.  ProBNP (last 3 results) No results for input(s): PROBNP in the last 8760 hours.  CBG: No results for input(s): GLUCAP in the last 168 hours.  No results found for this or any previous visit (from the past 240 hour(s)).   Studies: Ct Abdomen Pelvis W Contrast  06/29/2014   CLINICAL DATA:  Mid abdominal pain beginning 06/28/2014. Status post umbilical hernia repair is 06/12/2014.  EXAM: CT ABDOMEN AND PELVIS WITH CONTRAST  TECHNIQUE: Multidetector CT imaging of the abdomen and pelvis was performed using the standard protocol following bolus administration of intravenous contrast.  CONTRAST:  50 mL OMNIPAQUE IOHEXOL 300 MG/ML SOLN, 100 mL OMNIPAQUE IOHEXOL 300 MG/ML SOLN  COMPARISON:  None.  FINDINGS: The lung bases are clear.  No pleural or pericardial effusion.  The walls of the first portion the duodenum are markedly thickened with infiltration of surrounding fat. No free air is identified. There is  no small bowel obstruction. A large volume of stool is present throughout the colon. The colon is otherwise unremarkable. The appendix appears normal.  The gallbladder, spleen and adrenal glands appear normal. Small low attenuating lesions in the kidneys bilaterally are identified and likely represents cysts. Some are too small to definitively characterize. A few small low attenuating lesions are also identified in the liver which likely represent cysts.  There is some stranding of fat in the anterior abdominal wall at the umbilicus consistent with history of hernia repair. No fluid collection or other complicating feature  is identified. No lymphadenopathy or intra-abdominal fluid is identified.  No lytic or sclerotic bony lesion is seen. Lower lumbar spondylosis is noted.  IMPRESSION: Marked thickening of the walls of the first portion of the duodenum consistent with duodenitis and possible ulceration. No perforation is seen.  Status post umbilical hernia repair without complicating feature.  Large stool burden throughout the colon.   Electronically Signed   By: Drusilla Kannerhomas  Dalessio M.D.   On: 06/29/2014 18:23    Scheduled Meds: . atenolol  25 mg Oral Daily  . docusate sodium  100 mg Oral BID  . hydrochlorothiazide  25 mg Oral Daily  . hyoscyamine  0.25 mg Oral BID  . pantoprazole (PROTONIX) IV  40 mg Intravenous Q12H  . polyethylene glycol  17 g Oral BID   Continuous Infusions: . sodium chloride 50 mL/hr at 06/30/14 2205    Active Problems:   Duodenitis   Absolute anemia   Hypertension   Abdominal pain   IDA (iron deficiency anemia)    Time spent: 35 minutes    [facility],Jolleen Seman M  [facility] Pager [phone]. If 7PM-7AM, please contact night-coverage at [email or web address], password [email or web address] 07/01/2014, 9:44 AM  LOS: 2 days
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
## 2014-07-01 NOTE — Op Note (Signed)
[facility] [address] [place], [id number]   ENDOSCOPY PROCEDURE REPORT  PATIENT: Jeffery Clark, Jeffery Clark  MR#: [id number] BIRTHDATE: 12/10/1945 , 68  yrs. old GENDER: male  ENDOSCOPIST: West BaliSandi L Fields, MD REFERRED BY:  PROCEDURE DATE: 07/01/2014 PROCEDURE:   EGD w/ biopsy  INDICATIONS:anemia. MEDICATIONS: TCS+  Versed 1 mg IV, Demerol 25 mg IV, and Promethazine (Phenergan) 12.5 mg IV TOPICAL ANESTHETIC:   Viscous Xylocaine ASA CLASS:  DESCRIPTION OF PROCEDURE:     Physical exam was performed.  Informed consent was obtained from the patient after explaining the benefits, risks, and alternatives to the procedure.  The patient was connected to the monitor and placed in the left lateral position.  Continuous oxygen was provided by nasal cannula and IV medicine administered through an indwelling cannula.  After administration of sedation, the patients esophagus was intubated and the EG-[id number] ([id number])  endoscope was advanced under direct visualization to the second portion of the duodenum.  The scope was removed slowly by carefully examining the color, texture, anatomy, and integrity of the mucosa on the way out.  The patient was recovered in endoscopy and discharged home in satisfactory condition.   ESOPHAGUS: The mucosa of the esophagus appeared normal.  STOMACH: Severe non-erosive gastritis (inflammation) was found in the entire examined stomach.  Multiple biopsies were performed using cold forceps.   DUODENUM: Two non-bleeding non-bleeding, clean-based and punctate ulcers, ranging between 3-217mm in size, with surrounding edema were found in the duodenal bulb.   The duodenal mucosa showed no abnormalities in the 2nd part of the duodenum. COMPLICATIONS: There were no immediate complications.  ENDOSCOPIC IMPRESSION: 1.   PROFOUND ANEMIA DUE TO LARGE AND SMALL DUODENAL BULB ULCERS 2.   SEVERE Non-erosive gastritis  RECOMMENDATIONS: BID PPI for 3 mos then once  daily for 3 mos AWAIT BIOPSY ADVANCE DIET AVOID ASA/NSAIDS/ANTICOAGULATION FOR 2 WEEKS CONSIDER SBFT IN 3 MOS TO ASSESS FOR POSTBULBAR STRICTURE IF PT C/O POSTPRANDIAL ABDOMINAL PAIN, NAUSEA, OR VOMITING.  REPEAT EXAM: _________________________ eSignedWest Bali:  Sandi L Fields, MD 07/01/2014 3:05 PM    CPT CODES: ICD CODES:  The ICD and CPT codes recommended by this software are interpretations from the data that the clinical staff has captured with the software.  The verification of the translation of this report to the ICD and CPT codes and modifiers is the sole responsibility of the health care institution and practicing physician where this report was generated.  [employer] will not be held responsible for the validity of the ICD and CPT codes included on this report.  AMA assumes no liability for data contained or not contained herein. CPT is a Publishing rights managerregistered trademark of the [employer].

## 2014-07-02 ENCOUNTER — Encounter (HOSPITAL_COMMUNITY): Payer: Self-pay | Admitting: Internal Medicine

## 2014-07-02 DIAGNOSIS — K2970 Gastritis, unspecified, without bleeding: Secondary | ICD-10-CM

## 2014-07-02 DIAGNOSIS — K439 Ventral hernia without obstruction or gangrene: Secondary | ICD-10-CM | POA: Diagnosis present

## 2014-07-02 DIAGNOSIS — K2981 Duodenitis with bleeding: Secondary | ICD-10-CM | POA: Diagnosis present

## 2014-07-02 DIAGNOSIS — K5790 Diverticulosis of intestine, part unspecified, without perforation or abscess without bleeding: Secondary | ICD-10-CM | POA: Diagnosis present

## 2014-07-02 DIAGNOSIS — I10 Essential (primary) hypertension: Secondary | ICD-10-CM

## 2014-07-02 DIAGNOSIS — K264 Chronic or unspecified duodenal ulcer with hemorrhage: Secondary | ICD-10-CM

## 2014-07-02 DIAGNOSIS — K269 Duodenal ulcer, unspecified as acute or chronic, without hemorrhage or perforation: Secondary | ICD-10-CM | POA: Diagnosis present

## 2014-07-02 DIAGNOSIS — R1033 Periumbilical pain: Secondary | ICD-10-CM

## 2014-07-02 LAB — CBC
HCT: 23.9 % — ABNORMAL LOW (ref 39.0–52.0)
Hemoglobin: 7.9 g/dL — ABNORMAL LOW (ref 13.0–17.0)
MCH: 28.2 pg (ref 26.0–34.0)
MCHC: 33.1 g/dL (ref 30.0–36.0)
MCV: 85.4 fL (ref 78.0–100.0)
PLATELETS: 373 10*3/uL (ref 150–400)
RBC: 2.8 MIL/uL — ABNORMAL LOW (ref 4.22–5.81)
RDW: 15.2 % (ref 11.5–15.5)
WBC: 4.8 10*3/uL (ref 4.0–10.5)

## 2014-07-02 MED ORDER — FERROUS SULFATE 325 (65 FE) MG PO TABS: 325.0000 mg | ORAL_TABLET | Freq: Two times a day (BID) | ORAL | Status: DC

## 2014-07-02 MED ORDER — FERROUS SULFATE 325 (65 FE) MG PO TABS: 325.0000 mg | ORAL_TABLET | Freq: Two times a day (BID) | ORAL | Status: AC

## 2014-07-02 MED ORDER — PANTOPRAZOLE SODIUM 40 MG PO TBEC: 40.0000 mg | DELAYED_RELEASE_TABLET | Freq: Two times a day (BID) | ORAL | Status: AC

## 2014-07-02 NOTE — Discharge Summary (Signed)
Physician Discharge Summary  Jeffery Clark [place] ZOX:[id number] DOB: Aug 05, 1945 DOA: 06/29/2014  PCP: No PCP Per Patient  Admit date: 06/29/2014 Discharge date: 07/02/2014  Time spent: 40 minutes  Recommendations for Outpatient Follow-up:  1. Patient currently incarcerated. Recommend CBC 07/04/14 to track Hg. Will be released in few weeks at which time he will relocate to [place] and establish with PCP and gi.  2. Dr Darrick PennaFields office to follow biopsy. Recommending PPI and iron supplement and high fiber diet  Discharge Diagnoses:  Active Problems:   Duodenitis   Hypertension   Abdominal pain   IDA (iron deficiency anemia)   Bleeding gastrointestinal   Multiple duodenal ulcers   Gastritis   Diverticulosis   Duodenal ulcer   Ventral hernia   Discharge Condition: stable  Diet recommendation: heart healthy high fiber  Filed Weights   06/29/14 1523 06/29/14 2052  Weight: 104.327 kg (230 lb) 100.608 kg (221 lb 12.8 oz)    History of present illness:  This is a 69 year old man who underwent ventral hernia repair 2 weeks prior to presentation on 06/29/14. Postoperatively he was on oxycodone for management of postoperative pain. The oxycodone was discontinued 5 days prior and since this time he had abdominal pain, around the site of the ventral hernia repair. He  had no nausea, vomiting, hematemesis. He denied any melena. He had not had any frank rectal bleeding. He denied any chest pain, palpitations or dyspnea. There was no diarrhea. Evaluation in the emergency room with a CT abdominal scan  suggestive of duodenitis with possible duodenal ulceration. He was also found to be anemic.   Hospital Course:  1. Duodenits with duodenal ulcer. S/p EGD with biopsy revealing large and small duodenal bulb ulcers and severe non-erosive gastritis. Evaluated by GI who recommend PPI BID for 3 months then once daily for 3 months, avoiding ASA.NSAIDS/Anticoagulation for 2 weeks. GI note also recommends  considering SBFT in 3 mos to assess for postbulbar stricture if patient complains of postprandial abdominal pain, nausea, or vomiting.  2. Anemia, normocytic. S/P 1 unit PRBC's. Colonoscopy as below without source of anemia. Anemia panel with IDA. No NSAID use. No melena. GI recommending iron supplement. At discharge Hg stable 7.6-8.3 range. Suspect somewhat dilutional and he had recent surgery. Of note Hg 11.9 4 months ago. Asymptomatic. Recommend CBC 07/04/14 to trend Hg 3. HTN  controlled. Home meds include BB and HCTZ.  4. Ventral hernia: s/p repair last month. Pain resolved.   Procedures: 07/01/14 EGD PROFOUND ANEMIA DUE TO LARGE AND SMALL DUODENAL BULB ULCERS and  SEVERE Non-erosive gastritis. 07/01/14 colonoscopy Moderate diverticulosis throughout the entire examined colon. The LEFT colon IS Redundant. Moderate sized internal hemorrhoids. NO SOURCE FOR ANEMIA IDENTIFIED.  Consultations:  gastroenterology  Discharge Exam: Filed Vitals:   07/01/14 2222  BP: 111/54  Pulse: 71  Temp: 98.2 F (36.8 C)  Resp: 20    General: well nourished NAD Cardiovascular: RRR No m/g/r no LE edema Respiratory: normal effort BS clear bilaterally  Abdomen: non-distended +BS non-tender  Discharge Instructions    Current Discharge Medication List    START taking these medications   Details  ferrous sulfate 325 (65 FE) MG tablet Take 1 tablet (325 mg total) by mouth 2 (two) times daily with a meal. Qty: 60 tablet, Refills: 3    pantoprazole (PROTONIX) 40 MG tablet Take 1 tablet (40 mg total) by mouth 2 (two) times daily before a meal. Qty: 60 tablet, Refills: 2      CONTINUE  these medications which have NOT CHANGED   Details  atenolol (TENORMIN) 25 MG tablet Take 25 mg by mouth daily.    docusate sodium (COLACE) 100 MG capsule Take 100 mg by mouth 2 (two) times daily.    hydrochlorothiazide (HYDRODIURIL) 25 MG tablet Take 25 mg by mouth daily.    Hyoscyamine Sulfate (LEVSIN PO) Take  0.25 mg by mouth 2 (two) times daily.      STOP taking these medications     ketorolac (TORADOL) 10 MG tablet      oxyCODONE-acetaminophen (PERCOCET) 5-325 MG per tablet        Allergies  Allergen Reactions  . Penicillins    Follow-up Information    Please follow up.   Contact information:   patient currently incarcerated and will be released in few weeks. recommend CBC 5 days at facility to track Hg. will be relocating to [place] when released      Follow up with Jonette Eva, MD.   Specialty:  Gastroenterology   Why:  office will follow biopsy and contact patient   Contact information:   [address] [place] [address] [phone]        The results of significant diagnostics from this hospitalization (including imaging, microbiology, ancillary and laboratory) are listed below for reference.    Significant Diagnostic Studies: Ct Abdomen Pelvis W Contrast  06/29/2014   CLINICAL DATA:  Mid abdominal pain beginning 06/28/2014. Status post umbilical hernia repair is 06/12/2014.  EXAM: CT ABDOMEN AND PELVIS WITH CONTRAST  TECHNIQUE: Multidetector CT imaging of the abdomen and pelvis was performed using the standard protocol following bolus administration of intravenous contrast.  CONTRAST:  50 mL OMNIPAQUE IOHEXOL 300 MG/ML SOLN, 100 mL OMNIPAQUE IOHEXOL 300 MG/ML SOLN  COMPARISON:  None.  FINDINGS: The lung bases are clear.  No pleural or pericardial effusion.  The walls of the first portion the duodenum are markedly thickened with infiltration of surrounding fat. No free air is identified. There is no small bowel obstruction. A large volume of stool is present throughout the colon. The colon is otherwise unremarkable. The appendix appears normal.  The gallbladder, spleen and adrenal glands appear normal. Small low attenuating lesions in the kidneys bilaterally are identified and likely represents cysts. Some are too small to definitively  characterize. A few small low attenuating lesions are also identified in the liver which likely represent cysts.  There is some stranding of fat in the anterior abdominal wall at the umbilicus consistent with history of hernia repair. No fluid collection or other complicating feature is identified. No lymphadenopathy or intra-abdominal fluid is identified.  No lytic or sclerotic bony lesion is seen. Lower lumbar spondylosis is noted.  IMPRESSION: Marked thickening of the walls of the first portion of the duodenum consistent with duodenitis and possible ulceration. No perforation is seen.  Status post umbilical hernia repair without complicating feature.  Large stool burden throughout the colon.   Electronically Signed   By: Drusilla Kanner M.D.   On: 06/29/2014 18:23    Microbiology: No results found for this or any previous visit (from the past 240 hour(s)).   Labs: Basic Metabolic Panel:  Recent Labs Lab 06/29/14 1615 06/30/14 0704 07/01/14 0656  NA 131* 133* 135  K 3.7 4.1 4.0  CL 100 102 101  CO2 GLUCOSE 110* 103* 95  BUN CREATININE 1.10 1.19 1.04  CALCIUM 8.8 8.6 8.6  Liver Function Tests:  Recent Labs Lab 06/29/14 1615 06/30/14 0704  AST 13 8  ALT 14 11  ALKPHOS 63 57  BILITOT 0.6 0.5  PROT 7.2 6.3  ALBUMIN 3.5 3.0*    Recent Labs Lab 06/29/14 1615  LIPASE 20   No results for input(s): AMMONIA in the last 168 hours. CBC:  Recent Labs Lab 06/29/14 1615 06/30/14 0704 07/01/14 0656 07/02/14 0647  WBC 5.3 5.0 4.5 4.8  NEUTROABS 3.6  --   --   --   HGB 8.2* 7.6* 8.3* 7.9*  HCT 25.1* 23.2* 24.9* 23.9*  MCV 83.9 84.1 84.7 85.4  PLT 432* 394 378 373   Cardiac Enzymes: No results for input(s): CKTOTAL, CKMB, CKMBINDEX, TROPONINI in the last 168 hours. BNP: BNP (last 3 results) No results for input(s): BNP in the last 8760 hours.  ProBNP (last 3 results) No results for input(s): PROBNP in the last 8760 hours.  CBG: No results for  input(s): GLUCAP in the last 168 hours.     SignedGwenyth Bender  [facility] 07/02/2014, 12:11 PM

## 2014-07-02 NOTE — Progress Notes (Signed)
Subjective:  Denies abdominal pain. Denies DOE, SOB, lightheadedness.   Objective: Vital signs in last 24 hours: Temp:  [97.6 F (36.4 C)-98.2 F (36.8 C)] 98.2 F (36.8 C) (02/02 2222) Pulse Rate:  [58-77] 71 (02/02 2222) Resp:  [13-21] 20 (02/02 2222) BP: (92-132)/(54-73) 111/54 mmHg (02/02 2222) SpO2:  [94 %-100 %] 100 % (02/02 2222) Last BM Date: 07/01/14 General:   Alert,  Well-developed, well-nourished, pleasant and cooperative in NAD Head:  Normocephalic and atraumatic. Eyes:  Sclera clear, no icterus.  Abdomen:  Soft, nontender and nondistended. No masses, hepatosplenomegaly or hernias noted. Normal bowel sounds, without guarding, and without rebound.   Extremities:  Without clubbing, deformity or edema. Neurologic:  Alert and  oriented x4;  grossly normal neurologically. Skin:  Intact without significant lesions or rashes. Psych:  Alert and cooperative. Normal mood and affect.  Intake/Output from previous day: 02/02 0701 - 02/03 0700 In: 905 [P.O.:480; I.V.:425] Out: 8 [Urine:4; Stool:4] Intake/Output this shift:    Lab Results: CBC  Recent Labs  06/30/14 0704 07/01/14 0656 07/02/14 0647  WBC 5.0 4.5 4.8  HGB 7.6* 8.3* 7.9*  HCT 23.2* 24.9* 23.9*  MCV 84.1 84.7 85.4  PLT 394 378 373   BMET  Recent Labs  06/29/14 1615 06/30/14 0704 07/01/14 0656  NA 131* 133* 135  K 3.7 4.1 4.0  CL 100 102 101  CO2 GLUCOSE 110* 103* 95  BUN CREATININE 1.10 1.19 1.04  CALCIUM 8.8 8.6 8.6   LFTs  Recent Labs  06/29/14 1615 06/30/14 0704  BILITOT 0.6 0.5  ALKPHOS 63 57  AST 13 8  ALT 14 11  PROT 7.2 6.3  ALBUMIN 3.5 3.0*    Recent Labs  06/29/14 1615  LIPASE 20   PT/INR No results for input(s): LABPROT, INR in the last 72 hours.    Imaging Studies: Ct Abdomen Pelvis W Contrast  06/29/2014   CLINICAL DATA:  Mid abdominal pain beginning 06/28/2014. Status post umbilical hernia repair is 06/12/2014.  EXAM: CT ABDOMEN  AND PELVIS WITH CONTRAST  TECHNIQUE: Multidetector CT imaging of the abdomen and pelvis was performed using the standard protocol following bolus administration of intravenous contrast.  CONTRAST:  50 mL OMNIPAQUE IOHEXOL 300 MG/ML SOLN, 100 mL OMNIPAQUE IOHEXOL 300 MG/ML SOLN  COMPARISON:  None.  FINDINGS: The lung bases are clear.  No pleural or pericardial effusion.  The walls of the first portion the duodenum are markedly thickened with infiltration of surrounding fat. No free air is identified. There is no small bowel obstruction. A large volume of stool is present throughout the colon. The colon is otherwise unremarkable. The appendix appears normal.  The gallbladder, spleen and adrenal glands appear normal. Small low attenuating lesions in the kidneys bilaterally are identified and likely represents cysts. Some are too small to definitively characterize. A few small low attenuating lesions are also identified in the liver which likely represent cysts.  There is some stranding of fat in the anterior abdominal wall at the umbilicus consistent with history of hernia repair. No fluid collection or other complicating feature is identified. No lymphadenopathy or intra-abdominal fluid is identified.  No lytic or sclerotic bony lesion is seen. Lower lumbar spondylosis is noted.  IMPRESSION: Marked thickening of the walls of the first portion of the duodenum consistent with duodenitis and possible ulceration. No perforation is seen.  Status post umbilical hernia repair without complicating feature.  Large stool burden  throughout the colon.   Electronically Signed   By: Drusilla Kannerhomas  Dalessio M.D.   On: 06/29/2014 18:23  [2 weeks]   Assessment:  Pleasant 69 year old African-American male admitted with abdominal pain with CT findings of duodenitis, unable to exclude ulceration, and normocytic anemia. IDA noted. No overt signs of GI bleeding. EGD yesterday showed two non-bleeding punctate ulcers 3-737mm in size with  surrounding edema, severe non-erosive gastritis. Biopsies pending. TCS showed moderate sized internal hemorrhoids, diverticulosis.   Hemoglobin overall stable. Patient denies symptomatic anemia.  Plan: 1. PPI BID for 3 months then once daily for 3 months.  2. F/u biopsy 3. Avoid ASA/NSAIDS/Anticoagulation for 2 weeks.  4. Consider SBFT in 3 months to assess for postbulbar stricture if patient complains of postprandial abdominal pain, vomiting. Discussed with patient. 5. Recommend ferrous sulfate 325mg  BID for 3 months.  6. Patient reports he will be released from prison in a couple of weeks. [place] and [place] care there. Advised to call us in interim with any problems. He is aware that he should have next TCS in 10 years.  7. Will follow peripherally. Call with questions.   Leanna BattlesLeslie S. Dixon BoosLewis, PA-C [facility] [phone] 2/3/20169:11 AM     LOS: 3 days

## 2014-07-02 NOTE — Progress Notes (Signed)
D/C instructions & hard Rx given to patient/prison guard. Pt confirmed understanding the need to acquire PCP after release from prison in a few weeks and to f/u with GI MD as well. IV catheter removed from RIGHT forearm, catheter tip intact, no s/s of infection noted. Pt assisted to vehicle via w/c by staff with prison guard present.

## 2014-07-03 ENCOUNTER — Encounter (HOSPITAL_COMMUNITY): Payer: Self-pay | Admitting: Gastroenterology

## 2014-07-11 ENCOUNTER — Telehealth: Payer: Self-pay | Admitting: Gastroenterology

## 2014-07-11 NOTE — Telephone Encounter (Signed)
PLEASE CALL PT. HIS stomach Bx showed H. Pylori infection. IT CAUSE GASTRITIS AND SMALL BOWEL ULCERS. He needs AMOXICILLIN 500 mg 2 po BID for 10 days and Biaxin 500 mg po bid for 10 days, #qs, rfx0. CONTINUE PROTONIX BID FOR 3 MOS THEN ONCE DAILY FOREVER.Med side effects include NVD, abd pain, and metallic taste. TAKE ALL THE ABX AS PRESCRIBED. H PYLORI IF LEFT UNTREATED CAN LEAD TO STOMACH CANCER. OPV W/ DR. Jena GaussOURK IN 2-3 MOS E30 H PYLORI GASTRITIS.

## 2014-07-15 ENCOUNTER — Encounter: Payer: Self-pay | Admitting: Internal Medicine

## 2014-07-15 NOTE — Telephone Encounter (Signed)
[facility] is [facility]. I called them, spoke with Ms. Price and she told me to fax everything to her at [phone]. I faxed the orders from [facility] and the letter with the office visit appointment to them.

## 2014-07-15 NOTE — Telephone Encounter (Signed)
APPOINTMENT MADE AND LETTER SENT °

## 2014-07-18 ENCOUNTER — Telehealth: Payer: Self-pay | Admitting: Internal Medicine

## 2014-07-18 NOTE — Telephone Encounter (Signed)
Per pt recent discharge summary, [place] [facility].

## 2014-07-18 NOTE — Telephone Encounter (Signed)
Patient can seek out his GI care on his own. We'll be happy to send records to whomever he designates.

## 2014-07-18 NOTE — Telephone Encounter (Signed)
[facility] called today saying that the patient was there with his records from us stating he had been referred to them. The lady from [facility] said that she does not have anything regarding a referral on this patient. I spoke with CJ and she didn't see anything documented in his chart as needing a referral. I told the lady from [facility] that patient has OV here with [facility] on 4/19 at 8 am and [facility] had mailed him a letter telling him that on Tuesday. [facility] is going to let patient know that if he wants a referral with them that he can keep his OV with us first and we would refer him if needed.

## 2014-07-21 ENCOUNTER — Telehealth: Payer: Self-pay | Admitting: General Practice

## 2014-07-21 NOTE — Telephone Encounter (Signed)
I will try mailing the appointment letter to the alternate address listed on file.  [address] [place], [place] [id number]

## 2014-07-21 NOTE — Telephone Encounter (Signed)
I received a call from Ms. Marlyne BeardsJennings at [facility], she received a letter from us for the patient to have a follow-up appointment, however he was released from prison on 07/17/14.  She did not have any demographics for the patient.  The only information we have on file relates to the Prison.

## 2014-07-31 ENCOUNTER — Telehealth: Payer: Self-pay | Admitting: Internal Medicine

## 2014-07-31 NOTE — Telephone Encounter (Signed)
We had returned mail from patient reminding him of his OV with [facility] on 09/16/2014. [facility] and per previous phone note patient was [place]. OV cancelled

## 2014-07-31 NOTE — Telephone Encounter (Signed)
Noted  

## 2014-09-16 ENCOUNTER — Ambulatory Visit: Admitting: Internal Medicine

## 2015-08-24 IMAGING — CT CT ABD-PELV W/ CM
2 of 5 series · 16 of 46 positions shown, 18 images · IV contrast (Omnipaque 300)
Comparison: None.

CLINICAL DATA: Mid abdominal pain beginning 06/28/2014. Status post
umbilical hernia repair is 06/12/2014.

EXAM:
CT ABDOMEN AND PELVIS WITH CONTRAST
TECHNIQUE: Multidetector CT imaging of the abdomen and pelvis was performed
using the standard protocol following bolus administration of
intravenous contrast.
CONTRAST:  50 mL OMNIPAQUE IOHEXOL 300 MG/ML SOLN, 100 mL OMNIPAQUE
IOHEXOL 300 MG/ML SOLN

[Series 2: abd_pel_with 5.0 b40f · axial · 0.79mm/px · z∈[-416,+29]mm · 13 of 101 slices shown, 15 images]
[im 6/101  soft-tissue]
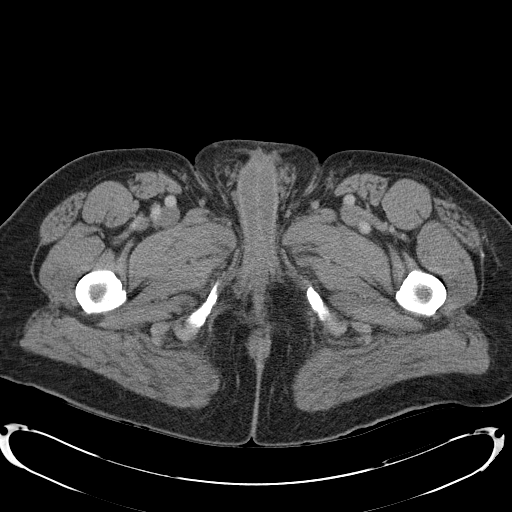
[im 6/101  bone]
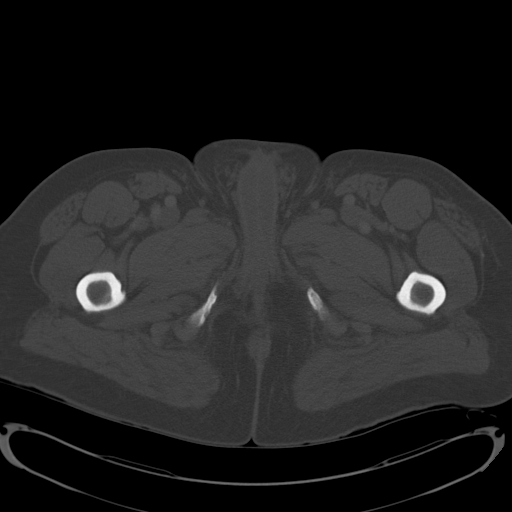
[im 12/101  soft-tissue]
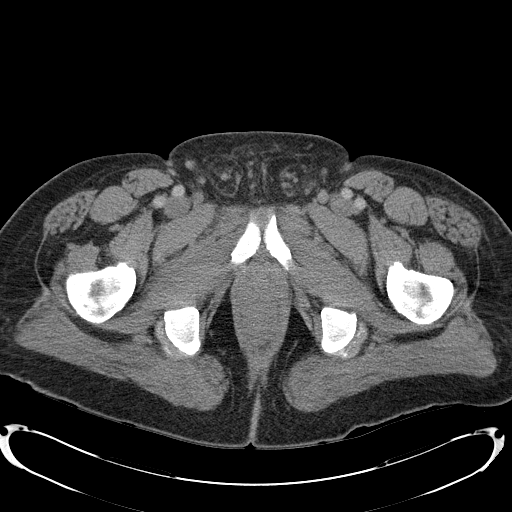
[im 24/101  soft-tissue]
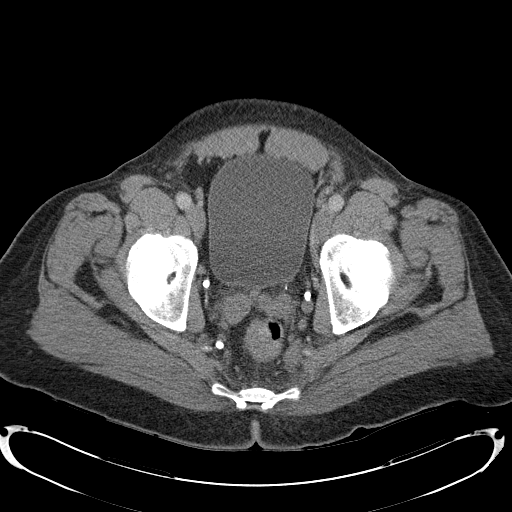
[im 30/101  soft-tissue]
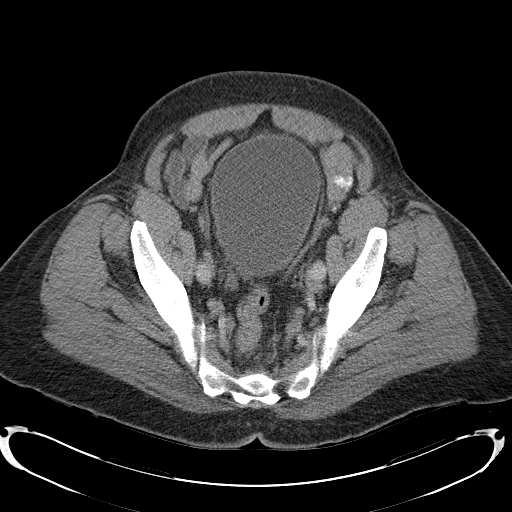
[im 36/101  soft-tissue]
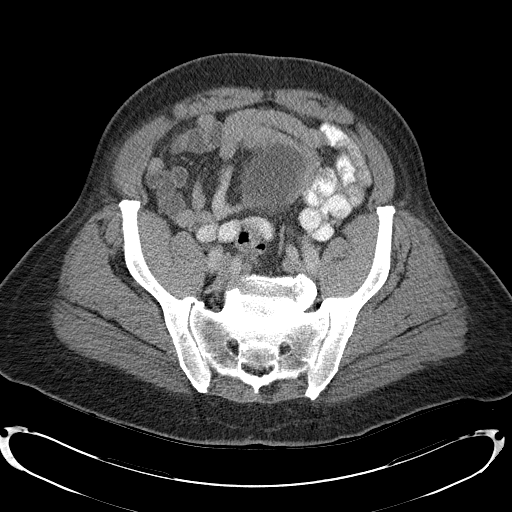
[im 42/101  soft-tissue]
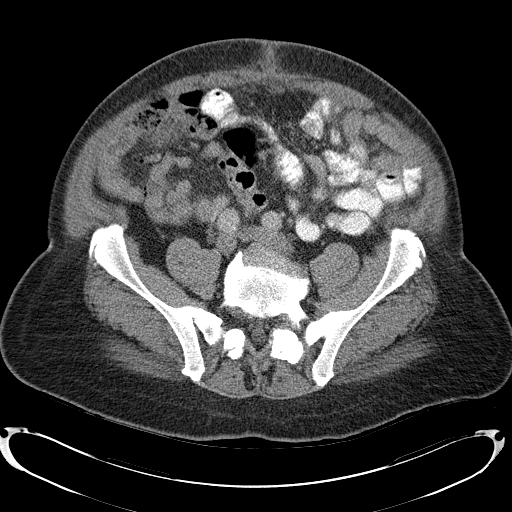
[im 53/101  soft-tissue]
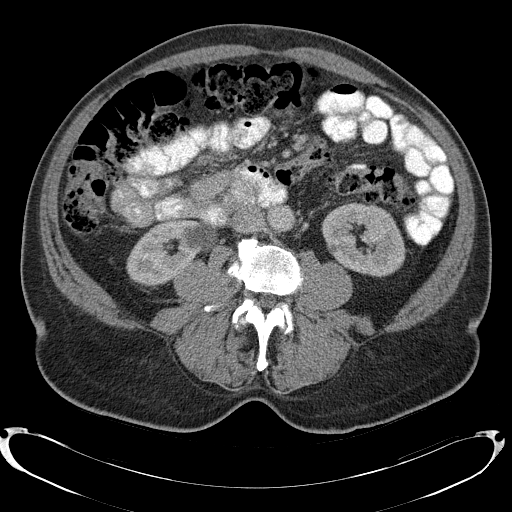
[im 59/101  soft-tissue]
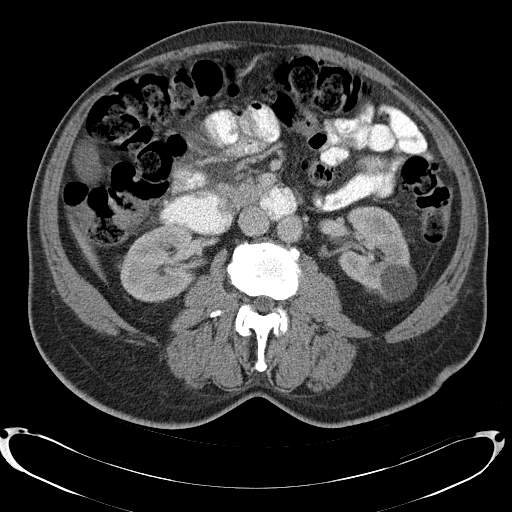
[im 65/101  soft-tissue]
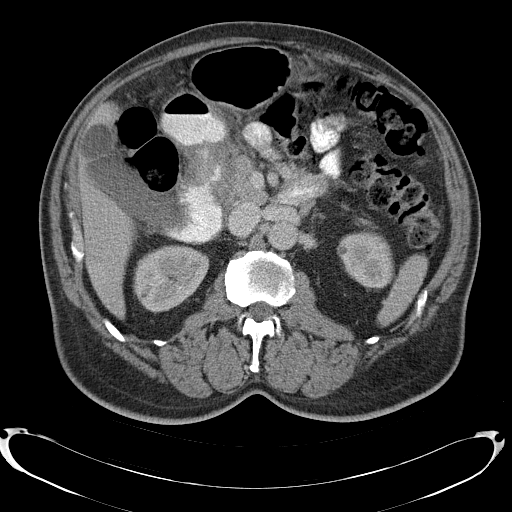
[im 65/101  bone]
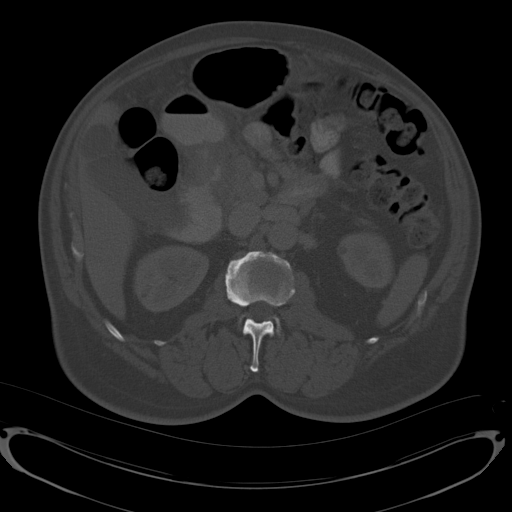
[im 71/101  soft-tissue]
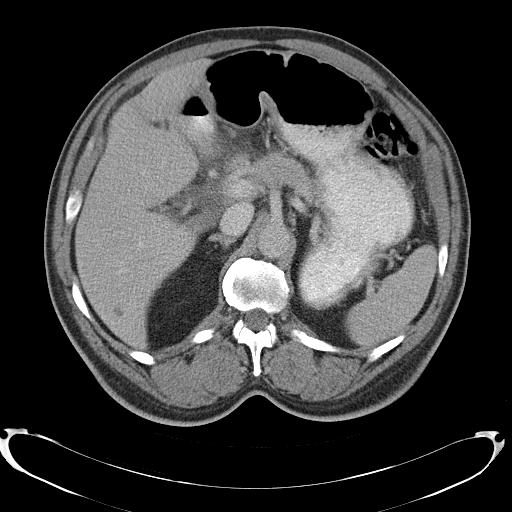
[im 77/101  soft-tissue]
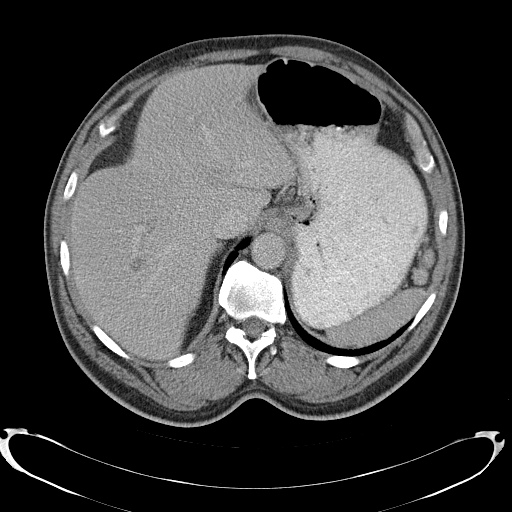
[im 89/101  soft-tissue]
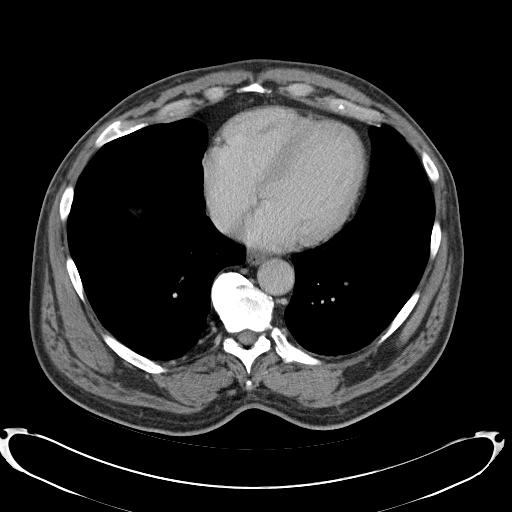
[im 95/101  soft-tissue]
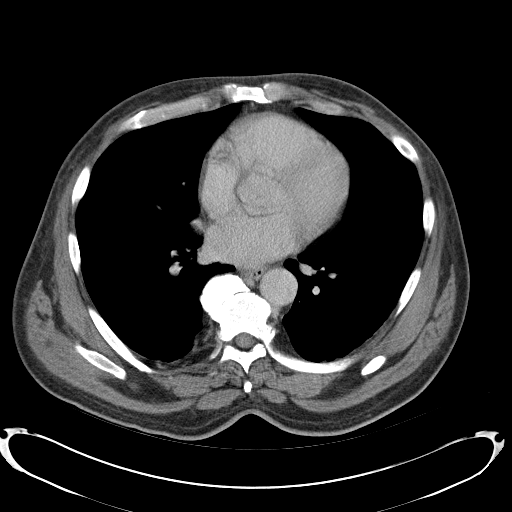

[Series 3: abd_pel_with 3.0 spo · coronal · 0.75mm/px · 3 of 109 slices shown]
[im 37/109  soft-tissue]
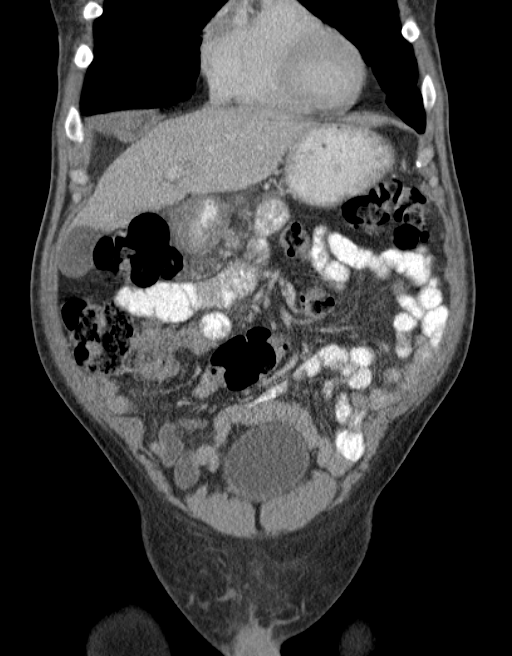
[im 49/109  soft-tissue]
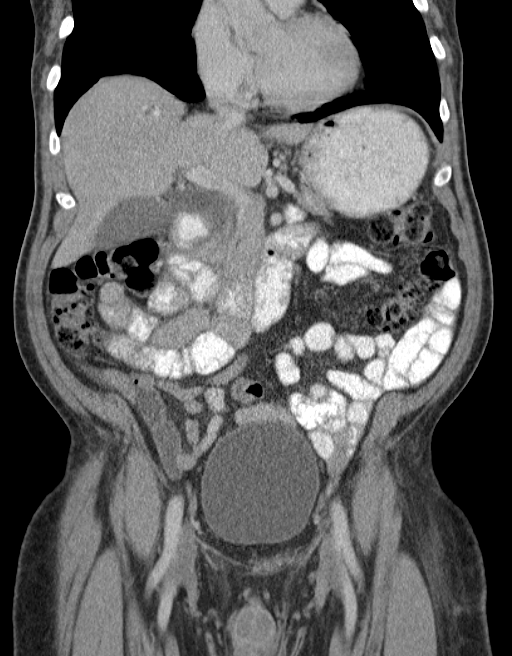
[im 61/109  soft-tissue]
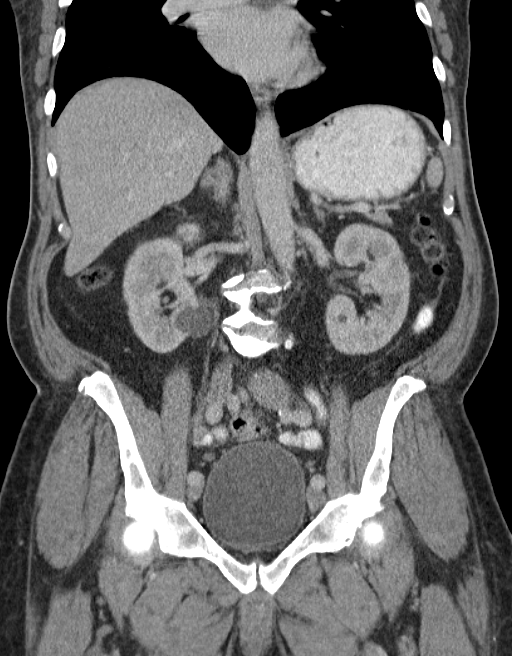

[16 of 46 positions shown; findings below may reference images not displayed]

FINDINGS: The lung bases are clear.  No pleural or pericardial effusion.

The walls of the first portion the duodenum are markedly thickened
with infiltration of surrounding fat. No free air is identified.
There is no small bowel obstruction. A large volume of stool is
present throughout the colon. The colon is otherwise unremarkable.
The appendix appears normal.

The gallbladder, spleen and adrenal glands appear normal. Small low
attenuating lesions in the kidneys bilaterally are identified and
likely represents cysts. Some are too small to definitively
characterize. A few small low attenuating lesions are also
identified in the liver which likely represent cysts.

There is some stranding of fat in the anterior abdominal wall at the
umbilicus consistent with history of hernia repair. No fluid
collection or other complicating feature is identified. No
lymphadenopathy or intra-abdominal fluid is identified.

No lytic or sclerotic bony lesion is seen. Lower lumbar spondylosis
is noted.
IMPRESSION: Marked thickening of the walls of the first portion of the duodenum
consistent with duodenitis and possible ulceration. No perforation
is seen.

Status post umbilical hernia repair without complicating feature.

Large stool burden throughout the colon.

## 2017-01-24 MED FILL — HYDROCHLOROTHIAZID/25MG/TABS: HYDROCHLOROTHIAZID/25MG/TABS | 30 days supply | Qty: 30 | Fill #5

## 2017-02-21 MED ORDER — DEXAMETHASONE SODIUM PHOSPHATE 0.1 % EYE DROPS
3 refills | 0.00000 days | Status: SS
Start: 2017-02-21 — End: 2018-03-22

## 2017-02-21 MED ORDER — DEXAMETHASONE SODIUM PHOSPHATE 0.1 % EYE DROPS: mL | 3 refills | 0 days | Status: SS

## 2017-02-21 MED FILL — HYDROCHLOROTHIAZID/25MG/TABS: HYDROCHLOROTHIAZID/25MG/TABS | 30 days supply | Qty: 30 | Fill #6

## 2017-02-21 MED FILL — CARVEDILOL/6.25MG/TAB: CARVEDILOL/6.25MG/TAB | 30 days supply | Qty: 120 | Fill #3

## 2017-02-21 MED FILL — ELIQUIS/5MG/TABS: ELIQUIS/5MG/TABS | 30 days supply | Qty: 60 | Fill #4

## 2017-03-07 ENCOUNTER — Ambulatory Visit
Admission: RE | Admit: 2017-03-07 | Discharge: 2017-03-07 | Payer: MEDICARE | Attending: Student in an Organized Health Care Education/Training Program | Admitting: Student in an Organized Health Care Education/Training Program

## 2017-03-07 DIAGNOSIS — R490 Dysphonia: Principal | ICD-10-CM

## 2017-03-07 DIAGNOSIS — J386 Stenosis of larynx: Secondary | ICD-10-CM

## 2017-03-22 ENCOUNTER — Ambulatory Visit
Admission: RE | Admit: 2017-03-22 | Discharge: 2017-03-22 | Payer: MEDICARE | Attending: Student in an Organized Health Care Education/Training Program | Admitting: Student in an Organized Health Care Education/Training Program

## 2017-03-22 DIAGNOSIS — I10 Essential (primary) hypertension: Principal | ICD-10-CM

## 2017-03-22 MED ORDER — LISINOPRIL 10 MG-HYDROCHLOROTHIAZIDE 12.5 MG TABLET
ORAL_TABLET | Freq: Every day | ORAL | 3 refills | 0.00000 days | Status: CP
Start: 2017-03-22 — End: 2017-11-09

## 2017-03-22 MED ORDER — LISINOPRIL 10 MG-HYDROCHLOROTHIAZIDE 12.5 MG TABLET: 1 | tablet | Freq: Every day | 3 refills | 0 days | Status: AC

## 2017-03-22 MED FILL — LISINOPRIL/HYDROCH/10-12.5MG/TABS: LISINOPRIL/HYDROCH/10-12.5MG/TABS | 30 days supply | Qty: 30 | Fill #0

## 2017-06-13 MED ORDER — APIXABAN 5 MG TABLET
ORAL_TABLET | ORAL | 99 refills | 0 days | Status: CP
Start: 2017-06-13 — End: 2018-08-06

## 2017-06-13 MED ORDER — ELIQUIS 5 MG TABLET
ORAL_TABLET | PRN refills | 0 days | Status: CP
Start: 2017-06-13 — End: 2017-06-13

## 2017-06-13 MED FILL — CARVEDILOL/6.25MG/TAB: CARVEDILOL/6.25MG/TAB | 30 days supply | Qty: 120 | Fill #4

## 2017-06-13 MED FILL — LISINOPRIL/HYDROCH/10-12.5MG/TABS: LISINOPRIL/HYDROCH/10-12.5MG/TABS | 30 days supply | Qty: 30 | Fill #1

## 2017-08-21 MED FILL — LISINOPRIL/HYDROCH/10-12.5MG/TABS: LISINOPRIL/HYDROCH/10-12.5MG/TABS | 30 days supply | Qty: 30 | Fill #2

## 2017-09-26 ENCOUNTER — Ambulatory Visit
Admit: 2017-09-26 | Discharge: 2017-09-27 | Payer: MEDICARE | Attending: Student in an Organized Health Care Education/Training Program | Primary: Student in an Organized Health Care Education/Training Program

## 2017-09-26 DIAGNOSIS — J386 Stenosis of larynx: Principal | ICD-10-CM

## 2017-09-26 DIAGNOSIS — R490 Dysphonia: Secondary | ICD-10-CM

## 2017-11-09 MED ORDER — LISINOPRIL 10 MG-HYDROCHLOROTHIAZIDE 12.5 MG TABLET
ORAL_TABLET | Freq: Every day | ORAL | 3 refills | 0.00000 days | Status: CP
Start: 2017-11-09 — End: 2017-11-15

## 2017-11-09 MED ORDER — LISINOPRIL 10 MG-HYDROCHLOROTHIAZIDE 12.5 MG TABLET: 1 | tablet | Freq: Every day | 3 refills | 0 days | Status: AC

## 2017-11-09 MED ORDER — CARVEDILOL 6.25 MG TABLET
ORAL_TABLET | 99 refills | 0.00000 days | Status: SS
Start: 2017-11-09 — End: 2018-03-23

## 2017-11-09 MED ORDER — CARVEDILOL 6.25 MG TABLET: tablet | 0 refills | 0 days | Status: AC

## 2017-11-09 MED FILL — CARVEDILOL/6.25MG/TAB: CARVEDILOL/6.25MG/TAB | 30 days supply | Qty: 120 | Fill #0

## 2017-11-09 MED FILL — LISINOPRIL/HYDROCH/10-12.5MG/TABS: LISINOPRIL/HYDROCH/10-12.5MG/TABS | 30 days supply | Qty: 30 | Fill #3

## 2017-11-09 MED FILL — ELIQUIS/5MG/TABS: ELIQUIS/5MG/TABS | 30 days supply | Qty: 60 | Fill #0

## 2017-11-15 ENCOUNTER — Ambulatory Visit
Admit: 2017-11-15 | Discharge: 2017-11-16 | Payer: MEDICARE | Attending: Student in an Organized Health Care Education/Training Program | Primary: Student in an Organized Health Care Education/Training Program

## 2017-11-15 DIAGNOSIS — I10 Essential (primary) hypertension: Principal | ICD-10-CM

## 2017-11-15 MED ORDER — LISINOPRIL 10 MG-HYDROCHLOROTHIAZIDE 12.5 MG TABLET: 2 | tablet | 3 refills | 0 days

## 2017-11-15 MED ORDER — LISINOPRIL 10 MG-HYDROCHLOROTHIAZIDE 12.5 MG TABLET
ORAL_TABLET | Freq: Every day | ORAL | 3 refills | 0.00000 days | Status: CP
Start: 2017-11-15 — End: 2018-04-03
  Filled 2018-01-16: qty 30, 15d supply, fill #0

## 2018-01-16 MED FILL — LISINOPRIL 10 MG-HYDROCHLOROTHIAZIDE 12.5 MG TABLET: 15 days supply | Qty: 30 | Fill #0 | Status: AC

## 2018-01-16 MED FILL — ELIQUIS 5 MG TABLET: ORAL | 30 days supply | Qty: 60 | Fill #0

## 2018-01-16 MED FILL — ELIQUIS 5 MG TABLET: 30 days supply | Qty: 60 | Fill #0 | Status: AC

## 2018-02-23 MED FILL — LISINOPRIL 10 MG-HYDROCHLOROTHIAZIDE 12.5 MG TABLET: ORAL | 30 days supply | Qty: 60 | Fill #1

## 2018-02-23 MED FILL — LISINOPRIL 10 MG-HYDROCHLOROTHIAZIDE 12.5 MG TABLET: 30 days supply | Qty: 60 | Fill #1 | Status: AC

## 2018-02-23 MED FILL — CARVEDILOL 6.25 MG TABLET: 30 days supply | Qty: 120 | Fill #0

## 2018-02-23 MED FILL — CARVEDILOL 6.25 MG TABLET: 30 days supply | Qty: 120 | Fill #0 | Status: AC

## 2018-03-17 ENCOUNTER — Ambulatory Visit: Admit: 2018-03-17 | Discharge: 2018-03-23 | Disposition: A | Discharge status: Home / Self Care | Payer: MEDICARE

## 2018-03-17 DIAGNOSIS — R1031 Right lower quadrant pain: Principal | ICD-10-CM

## 2018-03-20 DIAGNOSIS — R1031 Right lower quadrant pain: Principal | ICD-10-CM

## 2018-03-23 MED ORDER — CARVEDILOL 25 MG TABLET
ORAL_TABLET | Freq: Two times a day (BID) | ORAL | 1 refills | 0 days | Status: CP
Start: 2018-03-23 — End: 2018-11-14
  Filled 2018-03-23: qty 60, 30d supply, fill #0

## 2018-03-23 MED ORDER — HYDROCORTISONE ACETATE 25 MG RECTAL SUPPOSITORY
Freq: Two times a day (BID) | RECTAL | 0 refills | 0 days | Status: CP
Start: 2018-03-23 — End: 2018-03-28
  Filled 2018-03-23: qty 10, 5d supply, fill #0

## 2018-03-23 MED ORDER — MAGNESIUM OXIDE 400 MG (241.3 MG MAGNESIUM) TABLET
ORAL_TABLET | Freq: Two times a day (BID) | ORAL | 11 refills | 0 days | Status: CP
Start: 2018-03-23 — End: ?
  Filled 2018-03-23: qty 60, 30d supply, fill #0

## 2018-03-23 MED FILL — CARVEDILOL 25 MG TABLET: 30 days supply | Qty: 60 | Fill #0 | Status: AC

## 2018-03-23 MED FILL — HYDROCORTISONE ACETATE 25 MG RECTAL SUPPOSITORY: 5 days supply | Qty: 10 | Fill #0 | Status: AC

## 2018-03-23 MED FILL — MAGNESIUM OXIDE 400 MG (241.3 MG MAGNESIUM) TABLET: 30 days supply | Qty: 60 | Fill #0 | Status: AC

## 2018-04-03 ENCOUNTER — Ambulatory Visit
Admit: 2018-04-03 | Discharge: 2018-04-04 | Payer: MEDICARE | Attending: Student in an Organized Health Care Education/Training Program | Primary: Student in an Organized Health Care Education/Training Program

## 2018-04-03 ENCOUNTER — Ambulatory Visit: Admit: 2018-04-03 | Discharge: 2018-04-04 | Payer: MEDICARE

## 2018-04-03 DIAGNOSIS — Z136 Encounter for screening for cardiovascular disorders: Secondary | ICD-10-CM

## 2018-04-03 DIAGNOSIS — Z09 Encounter for follow-up examination after completed treatment for conditions other than malignant neoplasm: Principal | ICD-10-CM

## 2018-04-03 DIAGNOSIS — F1010 Alcohol abuse, uncomplicated: Secondary | ICD-10-CM

## 2018-04-03 DIAGNOSIS — Z1159 Encounter for screening for other viral diseases: Principal | ICD-10-CM

## 2018-04-03 DIAGNOSIS — I10 Essential (primary) hypertension: Secondary | ICD-10-CM

## 2018-04-03 DIAGNOSIS — K648 Other hemorrhoids: Secondary | ICD-10-CM

## 2018-04-03 DIAGNOSIS — Z0000 Encounter for general adult medical examination without abnormal findings: Secondary | ICD-10-CM

## 2018-04-03 MED ORDER — LISINOPRIL 20 MG-HYDROCHLOROTHIAZIDE 25 MG TABLET
ORAL_TABLET | Freq: Every day | ORAL | 2 refills | 0 days | Status: SS
Start: 2018-04-03 — End: 2018-07-21

## 2018-04-03 MED FILL — LISINOPRIL 20 MG-HYDROCHLOROTHIAZIDE 25 MG TABLET: 60 days supply | Qty: 60 | Fill #0 | Status: AC

## 2018-04-03 MED FILL — LISINOPRIL 20 MG-HYDROCHLOROTHIAZIDE 25 MG TABLET: ORAL | 60 days supply | Qty: 60 | Fill #0

## 2018-07-21 ENCOUNTER — Ambulatory Visit: Admit: 2018-07-21 | Discharge: 2018-07-22 | Disposition: A | Discharge status: Home / Self Care | Payer: MEDICARE

## 2018-07-22 MED ORDER — PREDNISONE 10 MG TABLET
ORAL_TABLET | 0 refills | 0 days | Status: CP
Start: 2018-07-22 — End: 2018-11-14
  Filled 2018-07-22: qty 26, 8d supply, fill #0

## 2018-07-22 MED ORDER — LORATADINE 10 MG TABLET
ORAL_TABLET | Freq: Two times a day (BID) | ORAL | 11 refills | 30.00000 days | Status: CP
Start: 2018-07-22 — End: 2019-07-22
  Filled 2018-07-22: qty 60, 30d supply, fill #0

## 2018-07-22 MED FILL — LORATADINE 10 MG TABLET: 30 days supply | Qty: 60 | Fill #0 | Status: AC

## 2018-07-22 MED FILL — TRIAMTERENE 37.5 MG-HYDROCHLOROTHIAZIDE 25 MG TABLET: 90 days supply | Qty: 90 | Fill #0 | Status: AC

## 2018-07-22 MED FILL — PREDNISONE 10 MG TABLET: 8 days supply | Qty: 26 | Fill #0 | Status: AC

## 2018-07-22 MED FILL — NIFEDIPINE ER 60 MG TABLET,EXTENDED RELEASE 24 HR: 30 days supply | Qty: 30 | Fill #0 | Status: AC

## 2018-07-23 MED ORDER — TRIAMTERENE 37.5 MG-HYDROCHLOROTHIAZIDE 25 MG TABLET
ORAL_TABLET | Freq: Every day | ORAL | 3 refills | 0.00000 days | Status: CP
Start: 2018-07-23 — End: 2019-07-18
  Filled 2018-07-22: qty 90, 90d supply, fill #0

## 2018-07-23 MED ORDER — VITAMIN B COMPLEX TABLET
ORAL_TABLET | Freq: Every day | ORAL | 11 refills | 0.00000 days
Start: 2018-07-23 — End: 2019-07-23

## 2018-07-23 MED ORDER — NIFEDIPINE ER 60 MG TABLET,EXTENDED RELEASE 24 HR
ORAL_TABLET | Freq: Every day | ORAL | 3 refills | 0 days | Status: CP
Start: 2018-07-23 — End: 2019-07-23
  Filled 2018-07-22: qty 30, 30d supply, fill #0
  Filled 2018-08-31: qty 90, 90d supply, fill #0

## 2018-08-06 MED FILL — CARVEDILOL 25 MG TABLET: 30 days supply | Qty: 60 | Fill #0 | Status: AC

## 2018-08-06 MED FILL — MAGNESIUM OXIDE 400 MG (241.3 MG MAGNESIUM) TABLET: ORAL | 30 days supply | Qty: 60 | Fill #0

## 2018-08-06 MED FILL — MAGNESIUM OXIDE 400 MG (241.3 MG MAGNESIUM) TABLET: 30 days supply | Qty: 60 | Fill #0 | Status: AC

## 2018-08-06 MED FILL — CARVEDILOL 25 MG TABLET: ORAL | 30 days supply | Qty: 60 | Fill #0

## 2018-08-09 MED ORDER — APIXABAN 5 MG TABLET
ORAL_TABLET | ORAL | PRN refills | 0 days | Status: CP
Start: 2018-08-09 — End: 2018-08-28

## 2018-08-28 MED ORDER — APIXABAN 5 MG TABLET
ORAL_TABLET | Freq: Two times a day (BID) | ORAL | 5 refills | 0 days | Status: CP
Start: 2018-08-28 — End: ?
  Filled 2018-08-31: qty 60, 30d supply, fill #0

## 2018-08-31 MED FILL — NIFEDIPINE ER 60 MG TABLET,EXTENDED RELEASE 24 HR: 90 days supply | Qty: 90 | Fill #0 | Status: AC

## 2018-08-31 MED FILL — ELIQUIS 5 MG TABLET: 30 days supply | Qty: 60 | Fill #0 | Status: AC

## 2018-09-24 MED FILL — LORATADINE 10 MG TABLET: 30 days supply | Qty: 60 | Fill #0 | Status: AC

## 2018-09-24 MED FILL — LORATADINE 10 MG TABLET: ORAL | 30 days supply | Qty: 60 | Fill #0

## 2018-10-19 MED FILL — MAGNESIUM OXIDE 400 MG (241.3 MG MAGNESIUM) TABLET: 30 days supply | Qty: 60 | Fill #1 | Status: AC

## 2018-10-19 MED FILL — MAGNESIUM OXIDE 400 MG (241.3 MG MAGNESIUM) TABLET: ORAL | 30 days supply | Qty: 60 | Fill #1

## 2018-11-14 ENCOUNTER — Ambulatory Visit: Admit: 2018-11-14 | Discharge: 2018-11-15 | Payer: MEDICARE

## 2018-11-14 DIAGNOSIS — Z86711 Personal history of pulmonary embolism: Principal | ICD-10-CM

## 2018-11-14 MED ORDER — CARVEDILOL 25 MG TABLET
ORAL_TABLET | Freq: Two times a day (BID) | ORAL | 11 refills | 0 days | Status: CP
Start: 2018-11-14 — End: ?
  Filled 2018-11-14: qty 60, 30d supply, fill #0

## 2018-11-14 MED FILL — CARVEDILOL 25 MG TABLET: 30 days supply | Qty: 60 | Fill #0 | Status: AC

## 2018-11-22 MED FILL — ELIQUIS 5 MG TABLET: ORAL | 30 days supply | Qty: 60 | Fill #1

## 2018-11-22 MED FILL — TRIAMTERENE 37.5 MG-HYDROCHLOROTHIAZIDE 25 MG TABLET: 90 days supply | Qty: 90 | Fill #0 | Status: AC

## 2018-11-22 MED FILL — TRIAMTERENE 37.5 MG-HYDROCHLOROTHIAZIDE 25 MG TABLET: ORAL | 90 days supply | Qty: 90 | Fill #0

## 2018-11-22 MED FILL — ELIQUIS 5 MG TABLET: 30 days supply | Qty: 60 | Fill #1 | Status: AC

## 2018-11-29 ENCOUNTER — Ambulatory Visit
Admit: 2018-11-29 | Discharge: 2018-11-30 | Payer: MEDICARE | Attending: Student in an Organized Health Care Education/Training Program | Primary: Student in an Organized Health Care Education/Training Program

## 2018-11-29 DIAGNOSIS — J9503 Malfunction of tracheostomy stoma: Principal | ICD-10-CM

## 2019-01-02 MED FILL — LORATADINE 10 MG TABLET: 30 days supply | Qty: 60 | Fill #1 | Status: AC

## 2019-01-02 MED FILL — LORATADINE 10 MG TABLET: ORAL | 30 days supply | Qty: 60 | Fill #1

## 2019-02-27 ENCOUNTER — Ambulatory Visit
Admit: 2019-02-27 | Discharge: 2019-02-27 | Payer: MEDICARE | Attending: Student in an Organized Health Care Education/Training Program | Primary: Student in an Organized Health Care Education/Training Program

## 2019-02-27 DIAGNOSIS — K648 Other hemorrhoids: Secondary | ICD-10-CM

## 2019-02-27 DIAGNOSIS — R799 Abnormal finding of blood chemistry, unspecified: Secondary | ICD-10-CM

## 2019-02-27 DIAGNOSIS — F1010 Alcohol abuse, uncomplicated: Secondary | ICD-10-CM

## 2019-02-27 DIAGNOSIS — Z86711 Personal history of pulmonary embolism: Secondary | ICD-10-CM

## 2019-02-27 DIAGNOSIS — I959 Hypotension, unspecified: Secondary | ICD-10-CM

## 2019-02-27 DIAGNOSIS — R358 Other polyuria: Secondary | ICD-10-CM

## 2019-02-27 DIAGNOSIS — Z79899 Other long term (current) drug therapy: Secondary | ICD-10-CM

## 2019-02-27 DIAGNOSIS — J398 Other specified diseases of upper respiratory tract: Secondary | ICD-10-CM

## 2019-02-27 DIAGNOSIS — R0600 Dyspnea, unspecified: Secondary | ICD-10-CM

## 2019-02-27 DIAGNOSIS — E876 Hypokalemia: Secondary | ICD-10-CM

## 2019-02-27 DIAGNOSIS — Z87891 Personal history of nicotine dependence: Secondary | ICD-10-CM

## 2019-02-27 DIAGNOSIS — I10 Essential (primary) hypertension: Secondary | ICD-10-CM

## 2019-02-27 DIAGNOSIS — Z09 Encounter for follow-up examination after completed treatment for conditions other than malignant neoplasm: Secondary | ICD-10-CM

## 2019-02-27 DIAGNOSIS — R634 Abnormal weight loss: Secondary | ICD-10-CM

## 2019-02-27 DIAGNOSIS — R42 Dizziness and giddiness: Secondary | ICD-10-CM

## 2019-02-27 DIAGNOSIS — F329 Major depressive disorder, single episode, unspecified: Secondary | ICD-10-CM

## 2019-02-27 DIAGNOSIS — R1900 Intra-abdominal and pelvic swelling, mass and lump, unspecified site: Secondary | ICD-10-CM

## 2019-02-27 DIAGNOSIS — Z114 Encounter for screening for human immunodeficiency virus [HIV]: Secondary | ICD-10-CM

## 2019-02-27 MED ORDER — POTASSIUM CHLORIDE ER 20 MEQ TABLET,EXTENDED RELEASE(PART/CRYST)
ORAL_TABLET | ORAL | 0 refills | 2 days | Status: CP
Start: 2019-02-27 — End: 2019-03-01
  Filled 2019-02-27: qty 10, 2d supply, fill #0

## 2019-02-27 MED FILL — POTASSIUM CHLORIDE ER 20 MEQ TABLET,EXTENDED RELEASE(PART/CRYST): 2 days supply | Qty: 10 | Fill #0 | Status: AC

## 2019-04-02 ENCOUNTER — Encounter
Admit: 2019-04-02 | Discharge: 2019-04-08 | Disposition: A | Discharge status: Home / Self Care | Payer: MEDICARE | Attending: Student in an Organized Health Care Education/Training Program

## 2019-04-02 ENCOUNTER — Ambulatory Visit: Admit: 2019-04-02 | Discharge: 2019-04-08 | Disposition: A | Discharge status: Home / Self Care | Payer: MEDICARE

## 2019-04-08 MED ORDER — MULTIVITAMIN-IRON 9 MG-FOLIC ACID 400 MCG-CALCIUM AND MINERALS TABLET
ORAL_TABLET | Freq: Every day | ORAL | 0 refills | 90.00000 days
Start: 2019-04-08 — End: ?

## 2019-04-08 MED ORDER — POTASSIUM CHLORIDE ER 20 MEQ TABLET,EXTENDED RELEASE(PART/CRYST)
ORAL_TABLET | Freq: Every day | ORAL | 0 refills | 14.00000 days | Status: CP
Start: 2019-04-08 — End: 2019-04-22
  Filled 2019-04-08: qty 28, 14d supply, fill #0

## 2019-04-08 MED FILL — POTASSIUM CHLORIDE ER 20 MEQ TABLET,EXTENDED RELEASE(PART/CRYST): 14 days supply | Qty: 28 | Fill #0 | Status: AC

## 2019-04-09 DIAGNOSIS — Z09 Encounter for follow-up examination after completed treatment for conditions other than malignant neoplasm: Principal | ICD-10-CM

## 2019-04-15 ENCOUNTER — Other Ambulatory Visit: Admit: 2019-04-15 | Discharge: 2019-04-15 | Payer: MEDICARE

## 2019-04-15 ENCOUNTER — Ambulatory Visit: Admit: 2019-04-15 | Discharge: 2019-04-15 | Payer: MEDICARE | Attending: Internal Medicine | Primary: Internal Medicine

## 2019-04-15 DIAGNOSIS — F1011 Alcohol abuse, in remission: Principal | ICD-10-CM

## 2019-04-15 DIAGNOSIS — K56609 Unspecified intestinal obstruction, unspecified as to partial versus complete obstruction: Principal | ICD-10-CM

## 2019-04-15 DIAGNOSIS — E278 Other specified disorders of adrenal gland: Principal | ICD-10-CM

## 2019-04-15 DIAGNOSIS — I10 Essential (primary) hypertension: Principal | ICD-10-CM

## 2019-04-15 DIAGNOSIS — Z09 Encounter for follow-up examination after completed treatment for conditions other than malignant neoplasm: Principal | ICD-10-CM

## 2019-04-15 MED ORDER — MAGNESIUM OXIDE 400 MG (241.3 MG MAGNESIUM) TABLET
ORAL_TABLET | Freq: Three times a day (TID) | ORAL | 11 refills | 30.00000 days | Status: CP
Start: 2019-04-15 — End: ?
  Filled 2019-04-15: qty 90, 30d supply, fill #0

## 2019-04-15 MED ORDER — POTASSIUM CHLORIDE ER 20 MEQ TABLET,EXTENDED RELEASE(PART/CRYST)
ORAL_TABLET | Freq: Every day | ORAL | 11 refills | 30 days | Status: CP
Start: 2019-04-15 — End: 2019-05-15

## 2019-04-15 MED ORDER — CARVEDILOL 3.125 MG TABLET
ORAL_TABLET | Freq: Two times a day (BID) | ORAL | 11 refills | 30.00000 days | Status: CP
Start: 2019-04-15 — End: 2020-04-14
  Filled 2019-04-15: qty 60, 30d supply, fill #0

## 2019-04-15 MED FILL — ELIQUIS 5 MG TABLET: ORAL | 30 days supply | Qty: 60 | Fill #2

## 2019-04-15 MED FILL — CARVEDILOL 3.125 MG TABLET: 30 days supply | Qty: 60 | Fill #0 | Status: AC

## 2019-04-15 MED FILL — ELIQUIS 5 MG TABLET: 30 days supply | Qty: 60 | Fill #2 | Status: AC

## 2019-04-15 MED FILL — MAGNESIUM OXIDE 400 MG (241.3 MG MAGNESIUM) TABLET: 30 days supply | Qty: 90 | Fill #0 | Status: AC

## 2019-06-13 ENCOUNTER — Ambulatory Visit
Admit: 2019-06-13 | Discharge: 2019-06-13 | Payer: MEDICARE | Attending: Student in an Organized Health Care Education/Training Program | Primary: Student in an Organized Health Care Education/Training Program

## 2019-06-13 ENCOUNTER — Ambulatory Visit: Admit: 2019-06-13 | Discharge: 2019-06-13 | Payer: MEDICARE | Attending: Family | Primary: Family

## 2019-06-13 ENCOUNTER — Ambulatory Visit: Admit: 2019-06-13 | Discharge: 2019-06-13 | Payer: MEDICARE

## 2019-06-13 DIAGNOSIS — E876 Hypokalemia: Principal | ICD-10-CM

## 2019-06-13 DIAGNOSIS — I10 Essential (primary) hypertension: Principal | ICD-10-CM

## 2019-06-13 DIAGNOSIS — E46 Unspecified protein-calorie malnutrition: Principal | ICD-10-CM

## 2019-06-13 MED ORDER — POTASSIUM CHLORIDE ER 20 MEQ TABLET,EXTENDED RELEASE(PART/CRYST): 40 meq | tablet | Freq: Every day | 0 refills | 30 days | Status: AC

## 2019-06-13 MED ORDER — POTASSIUM CHLORIDE ER 20 MEQ TABLET,EXTENDED RELEASE(PART/CRYST)
ORAL_TABLET | Freq: Every day | ORAL | 0 refills | 15.00000 days | Status: CP
Start: 2019-06-13 — End: 2019-06-13
  Filled 2019-06-18: qty 60, 30d supply, fill #0

## 2019-06-13 MED ORDER — CARVEDILOL 3.125 MG TABLET
ORAL_TABLET | Freq: Two times a day (BID) | ORAL | 11 refills | 15 days | Status: CP
Start: 2019-06-13 — End: 2020-06-12
  Filled 2019-06-13: qty 60, 30d supply, fill #1

## 2019-06-13 MED FILL — CARVEDILOL 3.125 MG TABLET: 30 days supply | Qty: 60 | Fill #1 | Status: AC

## 2019-06-18 ENCOUNTER — Ambulatory Visit: Admit: 2019-06-18 | Discharge: 2019-06-19 | Payer: MEDICARE

## 2019-06-18 MED FILL — POTASSIUM CHLORIDE ER 20 MEQ TABLET,EXTENDED RELEASE(PART/CRYST): 30 days supply | Qty: 60 | Fill #0 | Status: AC

## 2019-06-21 DIAGNOSIS — E876 Hypokalemia: Principal | ICD-10-CM

## 2019-06-25 ENCOUNTER — Ambulatory Visit: Admit: 2019-06-25 | Discharge: 2019-06-26 | Payer: MEDICARE

## 2019-07-02 ENCOUNTER — Ambulatory Visit: Admit: 2019-07-02 | Discharge: 2019-07-03 | Payer: MEDICARE

## 2019-07-02 DIAGNOSIS — E876 Hypokalemia: Principal | ICD-10-CM

## 2019-07-16 ENCOUNTER — Ambulatory Visit
Admit: 2019-07-16 | Discharge: 2019-07-16 | Payer: MEDICARE | Attending: Student in an Organized Health Care Education/Training Program | Primary: Student in an Organized Health Care Education/Training Program

## 2019-07-16 ENCOUNTER — Ambulatory Visit: Admit: 2019-07-16 | Discharge: 2019-07-16 | Payer: MEDICARE

## 2019-07-16 DIAGNOSIS — K56609 Unspecified intestinal obstruction, unspecified as to partial versus complete obstruction: Principal | ICD-10-CM

## 2019-07-16 DIAGNOSIS — I10 Essential (primary) hypertension: Principal | ICD-10-CM

## 2019-07-16 DIAGNOSIS — E876 Hypokalemia: Principal | ICD-10-CM

## 2019-07-16 MED ORDER — POTASSIUM CHLORIDE ER 20 MEQ TABLET,EXTENDED RELEASE(PART/CRYST): 40 meq | tablet | Freq: Every day | 5 refills | 30 days | Status: AC

## 2019-07-16 MED ORDER — POTASSIUM CHLORIDE ER 20 MEQ TABLET,EXTENDED RELEASE(PART/CRYST)
ORAL_TABLET | Freq: Every day | ORAL | 5 refills | 30.00000 days | Status: CP
Start: 2019-07-16 — End: 2019-07-16
  Filled 2019-07-16: qty 60, 30d supply, fill #0

## 2019-07-16 MED ORDER — CARVEDILOL 3.125 MG TABLET
ORAL_TABLET | Freq: Two times a day (BID) | ORAL | 11 refills | 30 days | Status: CP
Start: 2019-07-16 — End: 2020-07-15
  Filled 2019-07-16: qty 120, 30d supply, fill #0

## 2019-07-16 MED ORDER — MAGNESIUM OXIDE 400 MG (241.3 MG MAGNESIUM) TABLET
ORAL_TABLET | Freq: Three times a day (TID) | ORAL | 11 refills | 30 days | Status: CP
Start: 2019-07-16 — End: ?
  Filled 2019-07-16: qty 90, 30d supply, fill #0

## 2019-07-16 MED FILL — ELIQUIS 5 MG TABLET: ORAL | 30 days supply | Qty: 60 | Fill #3

## 2019-07-16 MED FILL — ELIQUIS 5 MG TABLET: 30 days supply | Qty: 60 | Fill #3 | Status: AC

## 2019-07-16 MED FILL — POTASSIUM CHLORIDE ER 20 MEQ TABLET,EXTENDED RELEASE(PART/CRYST): 30 days supply | Qty: 60 | Fill #0 | Status: AC

## 2019-07-16 MED FILL — CARVEDILOL 3.125 MG TABLET: 30 days supply | Qty: 120 | Fill #0 | Status: AC

## 2019-07-16 MED FILL — MAGNESIUM OXIDE 400 MG (241.3 MG MAGNESIUM) TABLET: 30 days supply | Qty: 90 | Fill #0 | Status: AC

## 2019-07-30 ENCOUNTER — Ambulatory Visit: Admit: 2019-07-30 | Discharge: 2019-07-31 | Payer: MEDICARE

## 2019-09-30 MED FILL — POTASSIUM CHLORIDE ER 20 MEQ TABLET,EXTENDED RELEASE(PART/CRYST): 30 days supply | Qty: 60 | Fill #1 | Status: AC

## 2019-09-30 MED FILL — POTASSIUM CHLORIDE ER 20 MEQ TABLET,EXTENDED RELEASE(PART/CRYST): ORAL | 30 days supply | Qty: 60 | Fill #1

## 2019-10-29 ENCOUNTER — Institutional Professional Consult (permissible substitution): Admit: 2019-10-29 | Discharge: 2019-10-30 | Payer: MEDICARE

## 2019-11-11 ENCOUNTER — Ambulatory Visit
Admit: 2019-11-11 | Discharge: 2019-11-12 | Payer: MEDICARE | Attending: Student in an Organized Health Care Education/Training Program | Primary: Student in an Organized Health Care Education/Training Program

## 2019-11-11 DIAGNOSIS — F1010 Alcohol abuse, uncomplicated: Principal | ICD-10-CM

## 2019-11-11 DIAGNOSIS — Z7289 Other problems related to lifestyle: Principal | ICD-10-CM

## 2019-11-11 DIAGNOSIS — R634 Abnormal weight loss: Principal | ICD-10-CM

## 2019-11-11 DIAGNOSIS — N189 Chronic kidney disease, unspecified: Principal | ICD-10-CM

## 2019-11-11 DIAGNOSIS — R7401 Elevated transaminase level: Principal | ICD-10-CM

## 2020-01-17 MED ORDER — APIXABAN 5 MG TABLET
ORAL_TABLET | Freq: Two times a day (BID) | ORAL | 5 refills | 30.00000 days | Status: CP
Start: 2020-01-17 — End: ?

## 2020-01-17 MED FILL — POTASSIUM CHLORIDE ER 20 MEQ TABLET,EXTENDED RELEASE(PART/CRYST): 30 days supply | Qty: 60 | Fill #0 | Status: AC

## 2020-01-17 MED FILL — POTASSIUM CHLORIDE ER 20 MEQ TABLET,EXTENDED RELEASE(PART/CRYST): ORAL | 30 days supply | Qty: 60 | Fill #0

## 2020-01-17 MED FILL — CARVEDILOL 3.125 MG TABLET: 30 days supply | Qty: 120 | Fill #0 | Status: AC

## 2020-01-17 MED FILL — CARVEDILOL 3.125 MG TABLET: ORAL | 30 days supply | Qty: 120 | Fill #0

## 2020-01-21 ENCOUNTER — Institutional Professional Consult (permissible substitution): Admit: 2020-01-21 | Discharge: 2020-01-22 | Payer: MEDICARE

## 2020-01-21 MED ORDER — APIXABAN 5 MG TABLET
ORAL_TABLET | Freq: Two times a day (BID) | ORAL | 5 refills | 30.00000 days | Status: CP
Start: 2020-01-21 — End: ?
  Filled 2020-01-24: qty 60, 30d supply, fill #0

## 2020-01-24 MED FILL — ELIQUIS 5 MG TABLET: 30 days supply | Qty: 60 | Fill #0 | Status: AC

## 2020-05-01 MED FILL — POTASSIUM CHLORIDE ER 20 MEQ TABLET,EXTENDED RELEASE(PART/CRYST): ORAL | 30 days supply | Qty: 60 | Fill #1

## 2020-05-01 MED FILL — MAGNESIUM OXIDE 400 MG (241.3 MG MAGNESIUM) TABLET: 30 days supply | Qty: 90 | Fill #0 | Status: AC

## 2020-05-01 MED FILL — MAGNESIUM OXIDE 400 MG (241.3 MG MAGNESIUM) TABLET: ORAL | 30 days supply | Qty: 90 | Fill #0

## 2020-05-01 MED FILL — POTASSIUM CHLORIDE ER 20 MEQ TABLET,EXTENDED RELEASE(PART/CRYST): 30 days supply | Qty: 60 | Fill #1 | Status: AC

## 2020-05-18 ENCOUNTER — Ambulatory Visit: Admit: 2020-05-18 | Discharge: 2020-05-19 | Payer: MEDICARE

## 2020-05-18 DIAGNOSIS — E876 Hypokalemia: Principal | ICD-10-CM

## 2020-05-18 DIAGNOSIS — I10 Essential (primary) hypertension: Principal | ICD-10-CM

## 2020-05-18 DIAGNOSIS — Z86711 Personal history of pulmonary embolism: Principal | ICD-10-CM

## 2020-07-03 MED FILL — CARVEDILOL 3.125 MG TABLET: ORAL | 30 days supply | Qty: 120 | Fill #1

## 2020-07-03 MED FILL — ELIQUIS 5 MG TABLET: ORAL | 30 days supply | Qty: 60 | Fill #1

## 2020-07-03 MED FILL — POTASSIUM CHLORIDE ER 20 MEQ TABLET,EXTENDED RELEASE(PART/CRYST): ORAL | 30 days supply | Qty: 60 | Fill #2

## 2020-07-27 DIAGNOSIS — I10 Essential (primary) hypertension: Principal | ICD-10-CM

## 2020-07-27 DIAGNOSIS — E876 Hypokalemia: Principal | ICD-10-CM

## 2020-07-29 MED ORDER — POTASSIUM CHLORIDE ER 20 MEQ TABLET,EXTENDED RELEASE(PART/CRYST)
ORAL_TABLET | Freq: Every day | ORAL | 5 refills | 30.00000 days | Status: CP
Start: 2020-07-29 — End: 2021-01-25
  Filled 2020-08-28: qty 60, 30d supply, fill #0

## 2020-07-29 MED ORDER — CARVEDILOL 3.125 MG TABLET
ORAL_TABLET | Freq: Two times a day (BID) | ORAL | 11 refills | 30 days | Status: CP
Start: 2020-07-29 — End: 2021-07-29

## 2020-08-28 DIAGNOSIS — K56609 Unspecified intestinal obstruction, unspecified as to partial versus complete obstruction: Principal | ICD-10-CM

## 2020-08-28 MED ORDER — MAGNESIUM OXIDE 400 MG (241.3 MG MAGNESIUM) TABLET
ORAL_TABLET | Freq: Three times a day (TID) | ORAL | 11 refills | 30 days | Status: CP
Start: 2020-08-28 — End: ?

## 2020-10-01 ENCOUNTER — Ambulatory Visit
Admit: 2020-10-01 | Discharge: 2020-10-09 | Disposition: A | Discharge status: Home / Self Care | Payer: MEDICARE | Admitting: Student in an Organized Health Care Education/Training Program

## 2020-10-08 ENCOUNTER — Ambulatory Visit: Admit: 2020-10-08 | Discharge: 2020-10-09

## 2020-10-09 DIAGNOSIS — C61 Malignant neoplasm of prostate: Principal | ICD-10-CM

## 2020-10-09 MED ORDER — OXYCODONE 5 MG TABLET
ORAL_TABLET | ORAL | 0 refills | 3 days | Status: CP | PRN
Start: 2020-10-09 — End: 2020-10-14
  Filled 2020-10-09: qty 20, 5d supply, fill #0

## 2020-10-09 MED ORDER — SENNOSIDES 8.6 MG TABLET
ORAL_TABLET | Freq: Two times a day (BID) | ORAL | 0 refills | 30 days | Status: CP
Start: 2020-10-09 — End: ?
  Filled 2020-10-09: qty 60, 30d supply, fill #0

## 2020-10-09 MED ORDER — DEXAMETHASONE 1 MG TABLET
ORAL_TABLET | ORAL | 0 refills | 18.00000 days | Status: CP
Start: 2020-10-09 — End: 2020-10-27
  Filled 2020-10-09: qty 37, 18d supply, fill #0

## 2020-10-09 MED ORDER — NALOXONE 4 MG/ACTUATION NASAL SPRAY
0 refills | 0 days | Status: CP | PRN
Start: 2020-10-09 — End: ?
  Filled 2020-10-09: qty 2, 1d supply, fill #0

## 2020-10-09 MED ORDER — MORPHINE ER 30 MG TABLET,EXTENDED RELEASE
ORAL_TABLET | Freq: Two times a day (BID) | ORAL | 0 refills | 5 days | Status: CP
Start: 2020-10-09 — End: ?
  Filled 2020-10-09: qty 10, 5d supply, fill #0

## 2020-10-09 MED ORDER — POLYETHYLENE GLYCOL 3350 17 GRAM ORAL POWDER PACKET
PACK | Freq: Two times a day (BID) | ORAL | 0 refills | 30.00000 days | Status: CP
Start: 2020-10-09 — End: ?
  Filled 2020-10-09: qty 60, 30d supply, fill #0

## 2020-10-14 ENCOUNTER — Ambulatory Visit: Admit: 2020-10-14 | Discharge: 2020-10-15 | Payer: MEDICARE

## 2020-10-14 DIAGNOSIS — C7951 Secondary malignant neoplasm of bone: Principal | ICD-10-CM

## 2020-10-14 DIAGNOSIS — G893 Neoplasm related pain (acute) (chronic): Principal | ICD-10-CM

## 2020-10-14 DIAGNOSIS — Z09 Encounter for follow-up examination after completed treatment for conditions other than malignant neoplasm: Principal | ICD-10-CM

## 2020-10-14 DIAGNOSIS — C61 Malignant neoplasm of prostate: Principal | ICD-10-CM

## 2020-10-14 MED ORDER — MORPHINE ER 30 MG TABLET,EXTENDED RELEASE
ORAL_TABLET | Freq: Two times a day (BID) | ORAL | 0 refills | 30 days | Status: CP
Start: 2020-10-14 — End: ?
  Filled 2020-10-14: qty 60, 30d supply, fill #0

## 2020-10-14 MED ORDER — OXYCODONE 5 MG TABLET
ORAL_TABLET | ORAL | 0 refills | 8 days | Status: CP | PRN
Start: 2020-10-14 — End: 2020-10-21

## 2020-10-16 DIAGNOSIS — C7951 Secondary malignant neoplasm of bone: Principal | ICD-10-CM

## 2020-10-16 DIAGNOSIS — C61 Malignant neoplasm of prostate: Principal | ICD-10-CM

## 2020-10-16 MED ORDER — OXYCODONE 5 MG TABLET
ORAL_TABLET | ORAL | 0 refills | 8 days | Status: CP | PRN
Start: 2020-10-16 — End: 2020-11-15
  Filled 2020-10-16: qty 60, 30d supply, fill #0

## 2020-10-19 DIAGNOSIS — C61 Malignant neoplasm of prostate: Principal | ICD-10-CM

## 2020-10-20 ENCOUNTER — Ambulatory Visit
Admit: 2020-10-20 | Discharge: 2020-10-21 | Payer: MEDICARE | Attending: Hematology & Oncology | Primary: Hematology & Oncology

## 2020-10-20 DIAGNOSIS — C61 Malignant neoplasm of prostate: Principal | ICD-10-CM

## 2020-11-02 ENCOUNTER — Institutional Professional Consult (permissible substitution): Admit: 2020-11-02 | Discharge: 2020-11-03 | Payer: MEDICARE

## 2020-11-02 ENCOUNTER — Other Ambulatory Visit: Admit: 2020-11-02 | Discharge: 2020-11-03 | Payer: MEDICARE

## 2020-11-02 ENCOUNTER — Ambulatory Visit
Admit: 2020-11-02 | Discharge: 2020-11-03 | Payer: MEDICARE | Attending: Student in an Organized Health Care Education/Training Program | Primary: Student in an Organized Health Care Education/Training Program

## 2020-11-02 ENCOUNTER — Ambulatory Visit: Admit: 2020-11-02 | Discharge: 2020-11-03 | Payer: MEDICARE

## 2020-11-02 DIAGNOSIS — C61 Malignant neoplasm of prostate: Principal | ICD-10-CM

## 2020-11-02 DIAGNOSIS — C7951 Secondary malignant neoplasm of bone: Principal | ICD-10-CM

## 2020-11-02 MED ORDER — POTASSIUM CHLORIDE ER 20 MEQ TABLET,EXTENDED RELEASE(PART/CRYST)
ORAL_TABLET | Freq: Every day | ORAL | 2 refills | 30.00000 days | Status: CP
Start: 2020-11-02 — End: 2020-12-02
  Filled 2020-11-02: qty 60, 30d supply, fill #0

## 2020-11-02 MED ORDER — OXYCODONE 5 MG TABLET
ORAL_TABLET | Freq: Four times a day (QID) | ORAL | 0 refills | 11.00000 days | Status: CP | PRN
Start: 2020-11-02 — End: 2020-11-16
  Filled 2020-11-02: qty 36, 9d supply, fill #0

## 2020-11-02 MED ORDER — MORPHINE ER 30 MG TABLET,EXTENDED RELEASE
ORAL_TABLET | Freq: Two times a day (BID) | ORAL | 0 refills | 28 days | Status: CP
Start: 2020-11-02 — End: 2020-11-30

## 2020-11-16 ENCOUNTER — Ambulatory Visit: Admit: 2020-11-16 | Discharge: 2020-11-17 | Payer: MEDICARE

## 2020-11-16 DIAGNOSIS — I10 Essential (primary) hypertension: Principal | ICD-10-CM

## 2020-11-16 DIAGNOSIS — C61 Malignant neoplasm of prostate: Principal | ICD-10-CM

## 2020-11-16 DIAGNOSIS — C7951 Secondary malignant neoplasm of bone: Principal | ICD-10-CM

## 2020-11-16 DIAGNOSIS — Z86711 Personal history of pulmonary embolism: Principal | ICD-10-CM

## 2020-11-16 MED ORDER — CARVEDILOL 3.125 MG TABLET
ORAL_TABLET | Freq: Two times a day (BID) | ORAL | 11 refills | 30.00000 days | Status: CP
Start: 2020-11-16 — End: 2021-11-16
  Filled 2020-11-16: qty 120, 30d supply, fill #0

## 2020-11-16 MED ORDER — APIXABAN 5 MG TABLET
ORAL_TABLET | Freq: Two times a day (BID) | ORAL | 11 refills | 30 days | Status: CP
Start: 2020-11-16 — End: ?
  Filled 2020-11-16: qty 60, 30d supply, fill #0

## 2020-11-20 ENCOUNTER — Ambulatory Visit
Admit: 2020-11-20 | Discharge: 2020-11-21 | Payer: MEDICARE | Attending: Student in an Organized Health Care Education/Training Program | Primary: Student in an Organized Health Care Education/Training Program

## 2020-11-20 DIAGNOSIS — Z86711 Personal history of pulmonary embolism: Principal | ICD-10-CM

## 2020-11-20 DIAGNOSIS — R600 Localized edema: Principal | ICD-10-CM

## 2020-11-20 DIAGNOSIS — C7951 Secondary malignant neoplasm of bone: Principal | ICD-10-CM

## 2020-11-20 DIAGNOSIS — I10 Essential (primary) hypertension: Principal | ICD-10-CM

## 2020-11-20 DIAGNOSIS — C61 Malignant neoplasm of prostate: Principal | ICD-10-CM

## 2020-11-27 MED ORDER — OXYCODONE 5 MG TABLET
ORAL_TABLET | Freq: Four times a day (QID) | ORAL | 0 refills | 11 days | Status: CP | PRN
Start: 2020-11-27 — End: 2020-12-11

## 2020-12-01 ENCOUNTER — Ambulatory Visit
Admit: 2020-12-01 | Discharge: 2020-12-01 | Payer: MEDICARE | Attending: Hematology & Oncology | Primary: Hematology & Oncology

## 2020-12-01 ENCOUNTER — Other Ambulatory Visit: Admit: 2020-12-01 | Discharge: 2020-12-01 | Payer: MEDICARE

## 2020-12-01 ENCOUNTER — Ambulatory Visit: Admit: 2020-12-01 | Discharge: 2020-12-01 | Payer: MEDICARE

## 2020-12-01 DIAGNOSIS — C61 Malignant neoplasm of prostate: Principal | ICD-10-CM

## 2020-12-01 DIAGNOSIS — C7951 Secondary malignant neoplasm of bone: Principal | ICD-10-CM

## 2020-12-01 MED ORDER — OXYCODONE 5 MG TABLET
ORAL_TABLET | Freq: Four times a day (QID) | ORAL | 0 refills | 15 days | Status: CP | PRN
Start: 2020-12-01 — End: ?
  Filled 2020-12-03: qty 60, 15d supply, fill #0

## 2020-12-18 MED ORDER — MORPHINE ER 30 MG TABLET,EXTENDED RELEASE
ORAL_TABLET | Freq: Every day | ORAL | 0 refills | 30 days | Status: CP
Start: 2020-12-18 — End: 2021-01-17
  Filled 2020-12-29: qty 30, 30d supply, fill #0

## 2020-12-18 MED FILL — POTASSIUM CHLORIDE ER 20 MEQ TABLET,EXTENDED RELEASE(PART/CRYST): ORAL | 30 days supply | Qty: 60 | Fill #1

## 2020-12-29 ENCOUNTER — Other Ambulatory Visit: Admit: 2020-12-29 | Discharge: 2020-12-30 | Payer: MEDICARE

## 2020-12-29 ENCOUNTER — Ambulatory Visit
Admit: 2020-12-29 | Discharge: 2020-12-30 | Payer: MEDICARE | Attending: Hematology & Oncology | Primary: Hematology & Oncology

## 2020-12-29 DIAGNOSIS — C7951 Secondary malignant neoplasm of bone: Principal | ICD-10-CM

## 2020-12-29 DIAGNOSIS — C61 Malignant neoplasm of prostate: Principal | ICD-10-CM

## 2020-12-29 MED ORDER — DAROLUTAMIDE 300 MG TABLET
ORAL_TABLET | Freq: Two times a day (BID) | ORAL | 11 refills | 30.00000 days | Status: CP
Start: 2020-12-29 — End: ?
  Filled 2021-01-06: qty 120, 30d supply, fill #0
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
## 2020-12-30 DIAGNOSIS — C61 Malignant neoplasm of prostate: Principal | ICD-10-CM

## 2021-01-04 NOTE — Unmapped (Signed)
[facility] Specialty Medication Onboarding    Specialty Medication: Nubeqa 300mg  tablets  Prior Authorization: Approved   Financial Assistance: No - copay  <$25  Final Copay/Day Supply: $4 / 30 days    Insurance Restrictions: None     Notes to Pharmacist:     The triage team has completed the benefits investigation and has determined that the patient is able to fill this medication at [facility]. Please contact the patient to complete the onboarding or follow up with the prescribing physician as needed.

## 2021-01-05 NOTE — Unmapped (Signed)
Adam Hoover contacted the [employer] requesting to speak with the care team of Adam Hoover to discuss:    Patient insurance denied by [facility] and was not sent medication by mail. [name] needs to speak with someone about getting denied for this and needs appeal within 7 days.  Darolutamide (NUBEQA) 300 mg    Please contact [name] at [phone]    Thank you,   [name]  [facility]   [phone]

## 2021-01-05 NOTE — Unmapped (Signed)
[facility]   Patient Onboarding/Medication Counseling    [name] is a 75 y.o. male with prostate cancer who I am counseling today on initiation of therapy.  I am speaking to the patient.    Was a Nurse, learning disability used for this call? No    Verified patient's date of birth / HIPAA.    Specialty medication(s) to be sent: Hematology/Oncology: Nubeqa      Non-specialty medications/supplies to be sent: n/a      Medications not needed at this time: n/a         Nubeqa (Darolutamide)    Medication & Administration     Dosage: Take 2 tablets (600mg ) by mouth twice daily    Administration:   ??? Take with food  ??? Swallow whole  ??? Take with calcium and vitamin D    Adherence/Missed dose instructions:  ??? Take a missed dose as soon as you think about it and go back to your normal time.  ??? Do not take 2 doses at the same time or extra doses.    Goals of Therapy     ??? Prevent prostate cancer progression    Side Effects & Monitoring Parameters     ??? Common side effects  ??? Fatigue, loss of strength or energy  ??? Pain in arms or legs  ??? Rash    ??? The following side effects should be reported to the provider:  ??? Allergic reaction  ??? Infections (fever, chills, cough, sore throat)    Contraindications, Warnings, & Precautions     ??? Bone marrow suppression  ??? Hepatic impairment - patients with moderate hepatic impairment have increased exposure to the drug. A reduced dose is recommended   ??? Renal impairment  - patients with severe renal impairment who are not receiving hemodialysis have increased exposure to the drug. A reduced dose is recommended.  ??? Males with male partners of reproductive potential should use effective contraception during treatment and for 1 week after the last dose.    Drug/Food Interactions     ??? Medication list reviewed in Epic. The patient was instructed to inform the care team before taking any new medications or supplements. No drug interactions identified.   ???   Storage, Handling Precautions, & Disposal     ??? Store room temperature  ??? Keep away from children and pets  ??? This drug is considered hazardous and should be handled as little as possible.  If someone else helps with medication administration, they should wear gloves.      Current Medications (including OTC/herbals), Comorbidities and Allergies     Current Outpatient Medications   Medication Sig Dispense Refill   ??? apixaban (ELIQUIS) 5 mg Tab Take 1 tablet by mouth two (2) times a day. 60 tablet 11   ??? carvediloL (COREG) 3.125 MG tablet Take 2 tablets (6.25 mg total) by mouth Two (2) times a day. 120 tablet 11   ??? darolutamide (NUBEQA) 300 mg tablet Take 2 tablets (600 mg total) by mouth in the morning and 2 tablets (600 mg total) in the evening. Take with meals. Take with food. Swallow tablets whole.. 120 tablet 11   ??? ferrous sulfate 27 mg iron Tab Take 1 tablet by mouth daily.      ??? magnesium oxide (MAG-OX) 400 mg (241.3 mg elemental magnesium) tablet Take 1 tablet (400 mg total) by mouth Three (3) times a day. (Patient taking differently: Take 400 mg by mouth Two (2) times a day.)  90 tablet 11   ??? MORPhine (MS CONTIN) 30 MG 12 hr tablet Take 1 tablet (30 mg total) by mouth in the morning. Fill on/after 12/18/20. 30 tablet 0   ??? multivitamins, therapeutic with minerals 9 mg iron-400 mcg tablet Take 1 tablet by mouth daily. 90 tablet 0   ??? naloxone (NARCAN) 4 mg nasal spray One spray in either nostril once for known/suspected opioid overdose. May repeat every 2-3 minutes in alternating nostril til EMS arrives 2 each 0   ??? oxyCODONE (ROXICODONE) 5 MG immediate release tablet Take 1 tablet (5 mg total) by mouth every six (6) hours as needed for pain. 60 tablet 0   ??? polyethylene glycol (MIRALAX) 17 gram packet Mix 1 packet (17 g) in 4 to 8 ounces of lliquid (tea, juice, or water) and drink by mouth Two (2) times a day. 60 packet 0   ??? potassium chloride (KLOR-CON) 20 MEQ CR tablet Take 2 tablets (40 mEq total) by mouth in the morning. 60 tablet 2   ??? senna (SENOKOT) 8.6 mg tablet Take 1 tablet by mouth Two (2) times a day. 60 tablet 0     No current facility-administered medications for this visit.       Allergies   Allergen Reactions   ??? Acetaminophen Hives     Per pt report Only, tolerated well.    ??? Penicillins Hives   ??? Lisinopril      Angioedema        Patient Active Problem List   Diagnosis   ??? Age-related cataract   ??? Tracheal stenosis   ??? 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 Diverticulosis of large intestine   ??? History of lower GI bleeding   ??? Hypertension   ??? Macrocytic anemia   ??? Alcohol abuse   ??? Bleeding internal hemorrhoids   ??? Angioedema   ??? Lower extremity edema   ??? Low back pain   ??? Chronic kidney disease (CKD)   ??? Weight loss   ??? Malignant neoplasm of prostate (CMS-HCC)   ??? Cancer related pain   ??? Bone metastasis (CMS-HCC)       Reviewed and up to date in Epic.    Appropriateness of Therapy     Acute infections noted within Epic:  No active infections  Patient reported infection: None    Is medication and dose appropriate based on diagnosis and infection status? Yes    Prescription has been clinically reviewed: Yes      Baseline Quality of Life Assessment      How many days over the past month did your prostate cancer  keep you from your normal activities? For example, brushing your teeth or getting up in the morning. 0    Financial Information     Medication Assistance provided: Prior Authorization    Anticipated copay of $4 reviewed with patient. Verified delivery address.    Delivery Information     Scheduled delivery date: 8/11    Expected start date: 8/12    Medication will be delivered via Next Day Courier to the prescription address in [facility].  This shipment will not require a signature.      Explained the services we provide at [facility] and that each month we would call to set up refills.  Stressed importance of returning phone calls so that we could ensure they receive their medications in time each month.  Informed patient that we should be setting up  refills 7-10 days prior to when they will run out of medication.  A pharmacist will reach out to perform a clinical assessment periodically.  Informed patient that a welcome packet, containing information about our pharmacy and other support services, a Notice of Privacy Practices, and a drug information handout will be sent.      The patient or caregiver noted above participated in the development of this care plan and knows that they can request review of or adjustments to the care plan at any time.      Patient or caregiver verbalized understanding of the above information as well as how to contact the pharmacy at [phone] option 4 with any questions/concerns.  The pharmacy is open Monday through Friday 8:30am-4:30pm.  A pharmacist is available 24/7 via pager to answer any clinical questions they may have.    Patient Specific Needs     - Does the patient have any physical, cognitive, or cultural barriers? No    - Does the patient have adequate living arrangements? (i.e. the ability to store and take their medication appropriately) Yes    - Did you identify any home environmental safety or security hazards? No    - Patient prefers to have medications discussed with  Patient     - Is the patient or caregiver able to read and understand education materials at a high school level or above? Yes    - Patient's primary language is  English     - Is the patient high risk? Yes, patient is taking oral chemotherapy. Appropriateness of therapy as been assessed    - Does the patient require physician intervention or other additional services (i.e. dietary/nutrition, smoking cessation, social work)? No      Clydell Hakim  [facility] Specialty Pharmacist

## 2021-01-29 ENCOUNTER — Telehealth: Admit: 2021-01-29 | Discharge: 2021-01-30 | Payer: MEDICARE

## 2021-01-29 DIAGNOSIS — G893 Neoplasm related pain (acute) (chronic): Principal | ICD-10-CM

## 2021-01-29 MED FILL — MAGNESIUM OXIDE 400 MG (241.3 MG MAGNESIUM) TABLET: ORAL | 40 days supply | Qty: 120 | Fill #0

## 2021-01-29 MED FILL — POTASSIUM CHLORIDE ER 20 MEQ TABLET,EXTENDED RELEASE(PART/CRYST): ORAL | 30 days supply | Qty: 60 | Fill #2

## 2021-01-29 MED FILL — ELIQUIS 5 MG TABLET: ORAL | 30 days supply | Qty: 60 | Fill #0

## 2021-01-29 MED FILL — CARVEDILOL 3.125 MG TABLET: ORAL | 30 days supply | Qty: 120 | Fill #0
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
## 2021-01-29 NOTE — Unmapped (Addendum)
[facility] Clinical Assessment & Refill Coordination Note    Adam Hoover, [place]: May 29, 1946  Phone: [phone] (home)     All above HIPAA information was verified with patient's family member, daughter Adam Hoover.     Was a Nurse, learning disability used for this call? No    Specialty Medication(s):   Hematology/Oncology: Nubeqa     Current Outpatient Medications   Medication Sig Dispense Refill   ??? apixaban (ELIQUIS) 5 mg Tab Take 1 tablet by mouth two (2) times a day. 60 tablet 11   ??? carvediloL (COREG) 3.125 MG tablet Take 2 tablets (6.25 mg total) by mouth Two (2) times a day. 120 tablet 11   ??? darolutamide (NUBEQA) 300 mg tablet Take 2 tablets (600 mg total) by mouth in the morning and 2 tablets (600 mg total) in the evening. Take with meals. Take with food. Swallow tablets whole.. 120 tablet 11   ??? ferrous sulfate 27 mg iron Tab Take 1 tablet by mouth daily.      ??? magnesium oxide (MAG-OX) 400 mg (241.3 mg elemental magnesium) tablet Take 1 tablet (400 mg total) by mouth Three (3) times a day. (Patient taking differently: Take 400 mg by mouth Two (2) times a day.) 90 tablet 11   ??? multivitamins, therapeutic with minerals 9 mg iron-400 mcg tablet Take 1 tablet by mouth daily. 90 tablet 0   ??? naloxone (NARCAN) 4 mg nasal spray One spray in either nostril once for known/suspected opioid overdose. May repeat every 2-3 minutes in alternating nostril til EMS arrives 2 each 0   ??? oxyCODONE (ROXICODONE) 5 MG immediate release tablet Take 1 tablet (5 mg total) by mouth every six (6) hours as needed for pain. 60 tablet 0   ??? polyethylene glycol (MIRALAX) 17 gram packet Mix 1 packet (17 g) in 4 to 8 ounces of lliquid (tea, juice, or water) and drink by mouth Two (2) times a day. 60 packet 0   ??? senna (SENOKOT) 8.6 mg tablet Take 1 tablet by mouth Two (2) times a day. 60 tablet 0     No current facility-administered medications for this visit.        Changes to medications: Adam Hoover reports no changes at this time.    Allergies   Allergen Reactions   ??? Acetaminophen Hives     Per pt report Only, tolerated well.    ??? Penicillins Hives   ??? Lisinopril      Angioedema        Changes to allergies: No    SPECIALTY MEDICATION ADHERENCE     Nubeqa 300 mg: 10 days of medicine on hand     Medication Adherence    Patient reported X missed doses in the last month: 0  Specialty Medication: Nubeqa 300 mg  Patient is on additional specialty medications: No  Informant: child/children  Confirmed plan for next specialty medication refill: delivery by pharmacy  Refills needed for supportive medications: not needed          Specialty medication(s) dose(s) confirmed: Regimen is correct and unchanged.     Are there any concerns with adherence? No    Adherence counseling provided? Not needed    CLINICAL MANAGEMENT AND INTERVENTION      Clinical Benefit Assessment:    Do you feel the medicine is effective or helping your condition? Yes    Clinical Benefit counseling provided? Not needed    Adverse Effects Assessment:    Are you experiencing  any side effects? Yes, patient reports experiencing fatigue. Side effect counseling provided: Recommended maintaining adequate hydration, rest, exercise as tolerated, and nutritious diet to help with fatigue.    Are you experiencing difficulty administering your medicine? No    Quality of Life Assessment:    Quality of Life    Rheumatology  Oncology  1. What impact has your specialty medication had on the reduction of your daily pain or discomfort level?: Some  2. On a scale of 1-10, how would you rate your ability to manage side effects associated with your specialty medication? (1=no issues, 10 = unable to take medication due to side effects): 1  Dermatology  Cystic Fibrosis          How many days over the past month did your Metastatic hormone sensitive prostate cancer  keep you from your normal activities? For example, brushing your teeth or getting up in the morning. 0     Have you discussed this with your provider? Not needed    Acute Infection Status:    Acute infections noted within Epic:  No active infections  Patient reported infection: None    Therapy Appropriateness:    Is therapy appropriate? Yes, therapy is appropriate and should be continued    DISEASE/MEDICATION-SPECIFIC INFORMATION      N/A    PATIENT SPECIFIC NEEDS     - Does the patient have any physical, cognitive, or cultural barriers? No    - Is the patient high risk? Yes, patient is taking oral chemotherapy. Appropriateness of therapy as been assessed    - Does the patient require a Care Management Plan? No     - Does the patient require physician intervention or other additional services (i.e. nutrition, smoking cessation, social work)? No      SHIPPING     Specialty Medication(s) to be Shipped:   Hematology/Oncology: Nubeqa    Other medication(s) to be shipped: No additional medications requested for fill at this time     Changes to insurance: No    Delivery Scheduled: Yes, Expected medication delivery date: 02/05/21.     Medication will be delivered via Next Day Courier to the confirmed prescription address in [facility].    The patient will receive a drug information handout for each medication shipped and additional FDA Medication Guides as required.  Verified that patient has previously received a Conservation officer, historic buildings and a Surveyor, mining.    The patient or caregiver noted above participated in the development of this care plan and knows that they can request review of or adjustments to the care plan at any time.      All of the patient's questions and concerns have been addressed.    Adam Hoover, PharmD   PGY1 Community-based Pharmacy Resident  [facility]    I reviewed this patient case and all documentation provided by the learner and was readily available for consultation during their interaction with the patient.  I agree with the assessment and plan listed below.    Breck Coons [facility] Specialty Pharmacist

## 2021-02-02 MED ORDER — OXYCODONE 5 MG TABLET
ORAL_TABLET | Freq: Two times a day (BID) | ORAL | 0 refills | 30.00000 days | Status: CP | PRN
Start: 2021-02-02 — End: ?
  Filled 2021-02-12: qty 60, 30d supply, fill #0

## 2021-02-04 MED FILL — NUBEQA 300 MG TABLET: ORAL | 30 days supply | Qty: 120 | Fill #1

## 2021-02-19 ENCOUNTER — Ambulatory Visit
Admit: 2021-02-19 | Discharge: 2021-02-20 | Payer: MEDICARE | Attending: Student in an Organized Health Care Education/Training Program | Primary: Student in an Organized Health Care Education/Training Program

## 2021-02-19 DIAGNOSIS — G893 Neoplasm related pain (acute) (chronic): Principal | ICD-10-CM

## 2021-02-19 DIAGNOSIS — C7951 Secondary malignant neoplasm of bone: Principal | ICD-10-CM

## 2021-02-19 DIAGNOSIS — C61 Malignant neoplasm of prostate: Principal | ICD-10-CM

## 2021-02-19 DIAGNOSIS — I872 Venous insufficiency (chronic) (peripheral): Principal | ICD-10-CM

## 2021-02-19 DIAGNOSIS — I10 Essential (primary) hypertension: Principal | ICD-10-CM

## 2021-02-19 MED ORDER — TRIAMCINOLONE ACETONIDE 0.1 % TOPICAL OINTMENT
Freq: Two times a day (BID) | TOPICAL | 0 refills | 0 days | Status: CP
Start: 2021-02-19 — End: 2022-02-19
  Filled 2021-02-19: qty 80, 30d supply, fill #0

## 2021-02-19 NOTE — Unmapped (Signed)
Internal Medicine Clinic Visit    Reason for visit: Leg swelling    A/P:           1. Venous stasis dermatitis of both lower extremities    2. Primary hypertension    3. Malignant neoplasm of prostate (CMS-HCC)    4. Bone metastasis (CMS-HCC)    5. Cancer related pain      1. Venous stasis dermatitis of both lower extremities  He has persistent bilateral lower extremity edema and has now developed pruritic lesions with some associated focal swelling and skin breakdown. I suspect that his swelling is due to venous insufficiency with development of stasis dermatitis. Given his symptoms, we will plan to trial a steroid cream to assist with symptom burden and otherwise continue with conservative management at this time.  -Continue leg elevation  -Continue compression stockings as able  -Encouraged frequent ambulation as able  -Start triamcinolone ointment BID PRN for lower extremities    2. Primary hypertension  His blood pressure today is quite elevated (164/98) which is significantly changed from his prior clinic visit with me (116/54). He continues on carvedilol without issue. He has started darolutamide in the interim, which has been shown to contribute to hypertension, so it is possible that this worsening is related to his cancer treatment. He does not routinely check blood pressure at home, so I have asked that he record a few blood pressures to better estimate his average blood pressure, but given his trend at recent oncology visits (130-140s systolic) I do suspect he will need additional agents to control his blood pressure.   -Continue carvedilol 6.125mg  BID   -Encouraged recording home blood pressures weekly  -Will follow up in a few weeks and consider adjusting regimen if sustained     3. Malignant neoplasm of prostate (CMS-HCC) - Bone metastasis (CMS-HCC) - Cancer related pain  He follows with Dr. Philomena Course and has started darolutamide and has been doing well on this regimen. His pain is well controlled and follows with palliative care.   -Continue to monitor    Return in about 26 days (around 03/17/2021) for HTN, venous stasis dermatitis .    Staffed with Dr. Beatrix Fetters, discussed    __________________________________________________________    HPI:    Adam Hoover has been doing well since his last visit. He has been seen by his oncology team in the interim and has been started on a new medication (Darolutamide). He feels well overall and states he does have some continued issues with pain and swelling in his lower extremities. He notes both of these issues come and go and for the most part are not causing him too much trouble. He feels his pain is well-controlled on current regimen. As for his swelling, this has been persistent since his last visit and over the last month or so he has developed some raised bumps that are itchy. He has been trying his best to wear compression stockings but notes these are often too tight that they are uncomfortable and he has to remove them. He tries to keep his legs elevated when he is resting and he does feel this helps. He is still able to do most of what he wants to do throughout the day and is able to stay somewhat active. He does note that when he overexerts himself he does feel tired and has to stop and catch his breath when being very active (doing yard work).   __________________________________________________________    Medications:  Reviewed  in EPIC  __________________________________________________________    Physical Exam:   Vital Signs:  Vitals:    02/19/21 0859   BP: 164/98   Pulse: 76   Resp: 18   Temp: 36.2 ??C (97.1 ??F)   TempSrc: Temporal   SpO2: 100%   Weight: (!) 104.9 kg (231 lb 3.2 oz)   Height: 180.3 cm (5' 11)     Gen: Well appearing, in NAD, in wheelchair, mildly hard of hearing  CV: RRR, no murmurs  Pulm: CTAB, no crackles or wheezes  Abd: Soft, NTND, normal BS  Ext: tense 2+ pitting edema of the BLE with scattered welt-like lesions and occasional associated skin tears    PHQ-9 Score:     GAD-7 Score:       Medication adherence and barriers to the treatment plan have been addressed. Opportunities to optimize healthy behaviors have been discussed. Patient / caregiver voiced understanding.    Ocie Doyne, MD  [facility] Internal Medicine - PGY-2  Pager: [phone]

## 2021-02-19 NOTE — Unmapped (Signed)
[facility]       Type of visit:  face to face    Reason for visit: follow up.    Questions / Concerns that need to be addressed: routine check    General Consent to Treat (GCT) for non-epic video visits only: Verbal consent    160/89, 77  Omron BPs (complete if screening BP has a systolic  > 129 or diastolic > 79)  BP#1 164/109, 76   BP#2  164/86, 75  BP#3     Average BP  164/98, 76  (please note this as a comment in vitals)     Allergies reviewed: Yes    Medication reviewed: Yes  Pended refills? Yes        HCDM reviewed and updated in Epic:    We are working to make sure all of our patients??? wishes are updated in Epic and part of that is documenting a Environmental health practitioner for each patient  A Health Care Decision Maker is someone you choose who can make health care decisions for you if you are not able - who would you most want to do this for you????  was updated.        BPAs completed:  Influenza vaccination      COVID-19 Vaccine Summary  Which COVID-19 Vaccine was administered  Pfizer  Type:  Pfizer  Dates Given:  11/20/2020  Due Date for next dose:   12/11/2020                      Immunization History   Administered Date(s) Administered   ??? COVID-19 VACC,MRNA,(PFIZER)(PF)(IM) 11/20/2020   ??? COVID-19 VACCINE,MRNA(MODERNA)(PF)(IM) 07/02/2019, 07/30/2019, 05/18/2020   ??? DTaP / IPV 01/28/2013   ??? Influenza Vaccine Quad (IIV4 PF) 61mo+ injectable 03/18/2016, 03/22/2017, 03/20/2018, 02/27/2019   ??? Influenza Virus Vaccine, unspecified formulation 05/31/2015, 03/11/2019   ??? PNEUMOCOCCAL POLYSACCHARIDE 23 04/13/2016, 04/08/2019   ??? Pneumococcal Conjugate 13-Valent 06/30/2014   ??? TdaP 08/20/2011       __________________________________________________________________________________________    SCREENINGS COMPLETED IN FLOWSHEETS    HARK Screening       AUDIT       PHQ2       PHQ9          P4 Suicidality Screener                GAD7       COPD Assessment       Falls Risk .imcres

## 2021-02-19 NOTE — Unmapped (Addendum)
Thank you for visiting the clinic today! Here is what we discussed:    Your blood pressure today is elevated. At your last visit is was well-controlled. If possible, I'd like you to check your blood pressure weekly at home (or the grocery store or pharmacy where they have a free blood pressure machine). If it remains elevated, I would like to start a medication to help bring this back down  Your leg swelling is likely due to venous insufficiency (the veins not working as well on pumping the fluid back out). As this is causing you to have itching and bumps, I'd like to start a steroid ointment called triamcinolone to help with these symptoms. Otherwise you should continue to keep your legs elevated, wear compression stockings, and stay as active as you can to help improve the blood flow.

## 2021-02-24 ENCOUNTER — Telehealth: Admit: 2021-02-24 | Discharge: 2021-02-25 | Payer: MEDICARE

## 2021-02-24 DIAGNOSIS — G893 Neoplasm related pain (acute) (chronic): Principal | ICD-10-CM
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
## 2021-02-25 NOTE — Unmapped (Signed)
[facility] Refill Coordination Note    Specialty Medication(s) to be Shipped:   Hematology/Oncology: Nubeqa    Other medication(s) to be shipped: No additional medications requested for fill at this time     Marshal Eskew, DOB: 08/10/1945  Phone: [phone] (home)       All above HIPAA information was verified with patient's family member, Spouse.     Was a Nurse, learning disability used for this call? No    Completed refill call assessment today to schedule patient's medication shipment from the [facility] [phone]).  All relevant notes have been reviewed.     Specialty medication(s) and dose(s) confirmed: Regimen is correct and unchanged.   Changes to medications: Larri reports no changes at this time.  Changes to insurance: No  New side effects reported not previously addressed with a pharmacist or physician: Yes - Patient reports BP is higher. Patient would not like to speak to the pharmacist today. Their provider is aware.  Questions for the pharmacist: No    Confirmed patient received a Conservation officer, historic buildings and a Surveyor, mining with first shipment. The patient will receive a drug information handout for each medication shipped and additional FDA Medication Guides as required.       DISEASE/MEDICATION-SPECIFIC INFORMATION        N/A    SPECIALTY MEDICATION ADHERENCE     Medication Adherence    Patient reported X missed doses in the last month: 0  Specialty Medication: Nubeqa 300 mg  Patient is on additional specialty medications: No  Informant: spouse              Were doses missed due to medication being on hold? No    Nubeqa 300 mg: 10 days of medicine on hand       REFERRAL TO PHARMACIST     Referral to the pharmacist: Not needed      [facility]     Shipping address confirmed in Epic.     Delivery Scheduled: Yes, Expected medication delivery date: 03/06/21.     Medication will be delivered via Next Day Courier to the prescription address in Epic [place].    [name]   [facility] Specialty Technician

## 2021-03-05 MED FILL — NUBEQA 300 MG TABLET: ORAL | 30 days supply | Qty: 120 | Fill #2

## 2021-03-11 ENCOUNTER — Other Ambulatory Visit: Admit: 2021-03-11 | Discharge: 2021-03-12 | Payer: MEDICARE

## 2021-03-11 ENCOUNTER — Ambulatory Visit
Admit: 2021-03-11 | Discharge: 2021-03-12 | Payer: MEDICARE | Attending: Hematology & Oncology | Primary: Hematology & Oncology

## 2021-03-11 DIAGNOSIS — C7951 Secondary malignant neoplasm of bone: Principal | ICD-10-CM

## 2021-03-11 DIAGNOSIS — C61 Malignant neoplasm of prostate: Principal | ICD-10-CM

## 2021-03-11 LAB — PSA: PROSTATE SPECIFIC ANTIGEN: 22.17 ng/mL — ABNORMAL HIGH (ref 0.00–4.00)

## 2021-03-11 LAB — MAGNESIUM: MAGNESIUM: 1.7 mg/dL (ref 1.6–2.6)

## 2021-03-11 LAB — POTASSIUM: POTASSIUM: 5.7 mmol/L — ABNORMAL HIGH (ref 3.5–5.1)

## 2021-03-11 LAB — CREATININE
CREATININE: 1.69 mg/dL — ABNORMAL HIGH
EGFR CKD-EPI (2021) MALE: 42 mL/min/{1.73_m2} — ABNORMAL LOW (ref >=60)

## 2021-03-11 NOTE — Unmapped (Signed)
Lab Results   Component Value Date    PSA 91.11 (H) 12/29/2020    PSA 162.07 (H) 12/01/2020    PSA 131.54 (H) 11/02/2020    PSA 1,099.46 (H) 10/01/2020      Please call [phone] to reach my nurse navigator Mauricia Area for any issues.    For emergencies on Nights, Weekends and Holidays  Call [phone] for help.      Griffin Basil, MD, PhD  Associate Professor of Medicine  Division of Hematology-Oncology    [facility]  Genitourinary Oncology Clinic  Nurse Navigator: Mauricia Area  Fax: [phone]

## 2021-03-11 NOTE — Unmapped (Signed)
Labs drawn and sent for analysis. Care provided by C.Gillian Scarce RN

## 2021-03-12 NOTE — Unmapped (Signed)
Called pt at the request of Dr. Philomena Course to give him lab results :  call pt to let him know of PSA (substantially down, great!) and potassium (elevated).  Please ask him to stop taking potassium pills now and also magnesium as well.  He asked me to renew his potassium prescription but I said I would first check his potassium.  It's high and potassium pills was discontinued.  He should stop taking potassium.    Pt verbalizes understanding and will stop taking potassium and magnesium.He will call back with any further questions or concerns.
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
## 2021-03-17 ENCOUNTER — Ambulatory Visit
Admit: 2021-03-17 | Discharge: 2021-03-18 | Payer: MEDICARE | Attending: Student in an Organized Health Care Education/Training Program | Primary: Student in an Organized Health Care Education/Training Program

## 2021-03-17 DIAGNOSIS — C61 Malignant neoplasm of prostate: Principal | ICD-10-CM

## 2021-03-17 DIAGNOSIS — I872 Venous insufficiency (chronic) (peripheral): Principal | ICD-10-CM

## 2021-03-17 DIAGNOSIS — I10 Essential (primary) hypertension: Principal | ICD-10-CM

## 2021-03-17 LAB — BASIC METABOLIC PANEL
ANION GAP: 8 mmol/L (ref 5–14)
BLOOD UREA NITROGEN: 38 mg/dL — ABNORMAL HIGH (ref 9–23)
BUN / CREAT RATIO: 22
CALCIUM: 9.2 mg/dL (ref 8.7–10.4)
CHLORIDE: 110 mmol/L — ABNORMAL HIGH (ref 98–107)
CO2: 19 mmol/L — ABNORMAL LOW (ref 20.0–31.0)
CREATININE: 1.69 mg/dL — ABNORMAL HIGH
EGFR CKD-EPI (2021) MALE: 42 mL/min/{1.73_m2} — ABNORMAL LOW (ref >=60)
GLUCOSE RANDOM: 92 mg/dL (ref 70–179)
POTASSIUM: 4.6 mmol/L (ref 3.4–4.8)
SODIUM: 137 mmol/L (ref 135–145)

## 2021-03-17 LAB — PHOSPHORUS: PHOSPHORUS: 3.5 mg/dL (ref 2.4–5.1)

## 2021-03-17 LAB — MAGNESIUM: MAGNESIUM: 1.8 mg/dL (ref 1.6–2.6)

## 2021-03-17 MED ORDER — HYDROCHLOROTHIAZIDE 12.5 MG CAPSULE
ORAL_CAPSULE | Freq: Every day | ORAL | 11 refills | 30 days | Status: CN
Start: 2021-03-17 — End: 2022-03-17

## 2021-03-17 NOTE — Unmapped (Signed)
[place] [facility] at [facility]       Type of visit:  face to face    Reason for visit: follow up    Questions / Concerns that need to be addressed:     General Consent to Treat (GCT) for non-epic video visits only: Verbal consent            Screening BP- 152/76-77        [employer]ron BPs (complete if screening BP has a systolic  > 139 or diastolic > 89)  BP#1 144/73-78   BP#2 141/74-75  BP#3 147/78-77    Average BP 144/75-77  (please note this as a comment in vitals)         Allergies reviewed: Yes    Medication reviewed: Yes  Pended refills? No        HCDM reviewed and updated in Epic:    We are working to make sure all of our patients??? wishes are updated in Epic and part of that is documenting a Environmental health practitioner for each patient  A Health Care Decision Maker is someone you choose who can make health care decisions for you if you are not able - who would you most want to do this for you????  is already up to date.            COVID Vaccination:  If Care Gap for COVID vaccine is present:  Have you received a COVID vaccine? yes  If yes: How many doses have you received? 4   Type of Vaccine received? Moderna   Date of 1st dose:    Date of 2nd dose:            __________________________________________________________________________________________    SCREENINGS COMPLETED IN FLOWSHEETS    HARK Screening       AUDIT       PHQ2       PHQ9          P4 Suicidality Screener                GAD7       COPD Assessment

## 2021-03-17 NOTE — Unmapped (Addendum)
Thank you for visiting the clinic today! Here is what we discussed:    I'm glad things are going well!   We will recheck your potassium and magnesium levels today based off the labs you had at Dr. Ainsley Spinner clinic last week.   Since your blood pressure has been on the high side, I want to start you on a new medication. Likely this will be hydrochlorothiazide, but I will follow up the labs before starting this medication for you as it can change your kidney numbers and electrolytes.  I will send you a message following these up once we get results back

## 2021-03-17 NOTE — Unmapped (Signed)
Internal Medicine Clinic Visit    Reason for visit: HTN follow up    A/P:           1. Primary hypertension    2. Venous stasis dermatitis of both lower extremities    3. Prostate cancer (CMS-HCC)      1. Primary hypertension  His blood pressure remains elevated today with average SBP of 144 today. On review of his medication list, his cancer medication darolutamide does have a known side effect of elevated blood pressure, so this could be secondary to this medication. Regardless, I do feel that this requires treatment as it has been persistently elevated. Given his other comorbid conditions and recent electrolyte and creatinine abnormalities, I will defer the selection of blood pressure agent until repeat labs are drawn at today's visit. I anticipate starting hydrochlorothiazide as this may help with his lower extremity edema, though if his creatinine and potassium are worrisome, I will plan instead for amlodipine. We did discuss both of these medications at today's visit and he is agreeable to starting either based off his repeat labs.   -Check BMP, Mg, Phos at today's visit (per oncology)   -Consider hydrochlorothiazide vs amlodipine   -Will arrange follow up 2-3 weeks following given new medication change     2. Venous stasis dermatitis of both lower extremities  Overall improving. He does not endorse significant symptoms or complications from this. He has been tolerating conservative treatments well.   -Continue to monitor     3. Prostate cancer (CMS-HCC)  Continues to follow with Dr. Philomena Course and has been doing well on current regimen.   -Labs as above       Return in about 3 weeks (around 04/07/2021) for Enhanced care HTN follow up .    Staffed with Dr. Hezzie Bump, seen and discussed    __________________________________________________________    HPI:    He is doing well at today's visit. He has no acute concerns. He has been following up with his oncologist, and they report that he has been doing well from a cancer perspective. His pain is under good control with the current regimen. His blood pressure remains elevated at today's visit. His potassium was slightly elevated at his last oncology visit and his potassium replacement was discontinued. He otherwise has no concerns today.   __________________________________________________________        Medications:  Reviewed in EPIC  __________________________________________________________    Physical Exam:   Vital Signs:  Vitals:    03/17/21 1041   BP: 152/76   Pulse: 77   Temp: 35.6 ??C (96.1 ??F)   TempSrc: Temporal   Weight: (!) 101.4 kg (223 lb 9.6 oz)   Height: 180.3 cm (5' 11)          PTHomeBP        Gen: Well appearing, in NAD, in wheelchair, family accompanying him   CV: RRR, no murmurs, no reproducible chest tenderness  Pulm: CTAB, no crackles or wheezes, comfortable work of breathing  Abd: Soft, NTND, normal BS  Ext: Persistent tense edema of BLE, slightly improved with improvement in skin tears     PHQ-9 Score:     GAD-7 Score:       Medication adherence and barriers to the treatment plan have been addressed. Opportunities to optimize healthy behaviors have been discussed. Patient / caregiver voiced understanding.    Ocie Doyne, MD  [facility] Internal Medicine - PGY-2  Pager: [phone]

## 2021-03-17 NOTE — Unmapped (Signed)
I saw and evaluated the patient, participating in the key portions of the service.  I reviewed the resident’s note.  I agree with the resident’s findings and plan. Hadiyah Maricle B Prue Lingenfelter, MD

## 2021-03-26 NOTE — Unmapped (Signed)
[facility] Refill Coordination Note    Specialty Medication(s) to be Shipped:   Hematology/Oncology: Nubeqa    Other medication(s) to be shipped: No additional medications requested for fill at this time     Adam Hoover, DOB: 05/07/1946  Phone: [phone] (home)       All above HIPAA information was verified with patient's family member, Daughter.     Was a Nurse, learning disability used for this call? No    Completed refill call assessment today to schedule patient's medication shipment from the [facility] [phone]).  All relevant notes have been reviewed.     Specialty medication(s) and dose(s) confirmed: Regimen is correct and unchanged.   Changes to medications: Montrel reports no changes at this time.  Changes to insurance: No  New side effects reported not previously addressed with a pharmacist or physician: None reported  Questions for the pharmacist: No    Confirmed patient received a Conservation officer, historic buildings and a Surveyor, mining with first shipment. The patient will receive a drug information handout for each medication shipped and additional FDA Medication Guides as required.       DISEASE/MEDICATION-SPECIFIC INFORMATION        N/A    SPECIALTY MEDICATION ADHERENCE     Medication Adherence    Patient reported X missed doses in the last month: 0  Specialty Medication: Nubeqa 300 mg  Patient is on additional specialty medications: No  Informant: child/children              Were doses missed due to medication being on hold? No    Nubeqa 300 mg: 12 days of medicine on hand       REFERRAL TO PHARMACIST     Referral to the pharmacist: Not needed      [facility]     Shipping address confirmed in Epic.     Delivery Scheduled: Yes, Expected medication delivery date: 04/06/21.     Medication will be delivered via Next Day Courier to the prescription address in Epic [place].    [name]   [facility] Specialty Technician

## 2021-04-02 ENCOUNTER — Ambulatory Visit
Admit: 2021-04-02 | Discharge: 2021-04-03 | Payer: MEDICARE | Attending: Student in an Organized Health Care Education/Training Program | Primary: Student in an Organized Health Care Education/Training Program

## 2021-04-02 DIAGNOSIS — I10 Essential (primary) hypertension: Principal | ICD-10-CM

## 2021-04-02 MED ORDER — HYDROCHLOROTHIAZIDE 12.5 MG TABLET
ORAL_TABLET | Freq: Every day | ORAL | 11 refills | 30 days | Status: CP
Start: 2021-04-02 — End: 2022-04-02
  Filled 2021-04-02: qty 30, 30d supply, fill #0

## 2021-04-02 MED ORDER — CARVEDILOL 3.125 MG TABLET
ORAL_TABLET | Freq: Two times a day (BID) | ORAL | 11 refills | 30.00000 days | Status: CP
Start: 2021-04-02 — End: 2022-04-02
  Filled 2021-04-02: qty 120, 30d supply, fill #0

## 2021-04-02 NOTE — Unmapped (Addendum)
Thank you for visiting the clinic today! Here is what we discussed:    Your labs all looked stable. Your blood pressure is still on the high side, so we will start a new medication.   The new medication is called hydrochlorothiazide and it works to make you urinate more frequently and can help decrease blood pressure. This can also help some with the swelling in your legs.   You should continue with carvedilol, I have sent a refill for you

## 2021-04-02 NOTE — Unmapped (Signed)
Internal Medicine Clinic Visit    Reason for visit: Blood pressure    A/P:           1. Essential hypertension    His blood pressure is elevated again today to 152/84. Given this persistent elevation we will initiate treatment with thiazide diuretic. His labs checked at last visit were stable and given his lower extremity edema we feel thiazide diuretic will be a reasonable addition to aid in blood pressure control. He will have repeat labs with Dr. Philomena Course at the beginning of December and follows with Arby Barrette at the end of December.   -Continue Coreg 6.25mg  BID, refill sent  -Start hydrochlorothiazide 12.5mg  daily   -Repeat labs in 4 weeks  -Follow up with Arby Barrette in 6 weeks, re-check BP and titrate hydrochlorothiazide as needed    Return in about 3 months (around 07/03/2021) for Next scheduled follow up.    Staffed with Dr. Marguerita Beards, seen and discussed    __________________________________________________________    HPI:    He has been doing well since his last visit. He did run out of Coreg for the past few days so he has not taken this before today's visit. His leg swelling is stable and his pain is well controlled. He does have regular bowel movements. He continues to tolerate his cancer medications well.   __________________________________________________________    Medications:  Reviewed in EPIC  __________________________________________________________    Physical Exam:   Vital Signs:  Vitals:    04/02/21 1430   BP: 152/84   BP Site: L Arm   BP Position: Sitting   BP Cuff Size: Large   Pulse: 77   Resp: 16   Temp: 36.4 ??C (97.6 ??F)   TempSrc: Temporal   SpO2: 98%   Weight: (!) 107.2 kg (236 lb 6.4 oz)   Height: 180.3 cm (5' 11)          PTHomeBP    Gen: Well appearing, in NAD, sitting in wheelchair  CV: RRR, no murmurs  Pulm: CTAB, no crackles or wheezes, comfortable WOB  Abd: Soft, NTND, normal BS  Ext: 1+ tense pitting edema in BLE     PHQ-9 Score:     GAD-7 Score:       Medication adherence and barriers to the treatment plan have been addressed. Opportunities to optimize healthy behaviors have been discussed. Patient / caregiver voiced understanding.    Ocie Doyne, MD  [facility] Internal Medicine - PGY-2  Pager: [phone]

## 2021-04-02 NOTE — Unmapped (Signed)
[facility] at [facility]     Reason for visit: Follow-up    Questions / Concerns that need to be addressed: no    Diabetes:  ??? Regularly checking blood sugars?: no    Hypertension:  ??? Have blood pressure cuff at home?: no  ??? Regularly checking blood pressure?: no  ??? If patient has a  record of recent blood pressures, please enter into flowsheets using this link PTHomeBP    Omron BPs (complete if screening BP has a systolic  > 129 or diastolic > 79)  BP#1 153/84 HR 77   BP#2 151/87 HR 79  BP#3 1551/82 HR 74    Average BP 152/84 HR 77  (please note this as a comment in vitals)     Allergies reviewed: Yes    Medication reviewed: Yes  Pended refills? No    HCDM reviewed and updated in Epic:    We are working to make sure all of our patients??? wishes are updated in Epic and part of that is documenting a Environmental health practitioner for each patient  A Health Care Decision Maker is someone you choose who can make health care decisions for you if you are not able - who would you most want to do this for you????  is already up to date.    BPAs completed:  Moderna Bivalent Booster    COVID-19 Vaccine Summary  Which COVID-19 Vaccine was administered  Pfizer  Type:  Pfizer  Dates Given:  04/02/2021    Immunization History   Administered Date(s) Administered   ??? COVID-19 VAC,BV(6-79YR or 17YR UP)BOOST,MODERNA 04/02/2021   ??? COVID-19 VACC,MRNA,(PFIZER)(PF)(IM) 11/20/2020   ??? COVID-19 VACCINE,MRNA(MODERNA)(PF)(IM) 07/02/2019, 07/30/2019, 05/18/2020   ??? DTaP / IPV 01/28/2013   ??? INFLUENZA QUAD ADJUVANTED 44YR UP(FLUAD) 02/19/2021   ??? Influenza Vaccine Quad (IIV4 PF) 21mo+ injectable 03/18/2016, 03/22/2017, 03/20/2018, 02/27/2019   ??? Influenza Virus Vaccine, unspecified formulation 05/31/2015, 03/11/2019   ??? PNEUMOCOCCAL POLYSACCHARIDE 23 04/13/2016, 04/08/2019   ??? Pneumococcal Conjugate 13-Valent 06/30/2014   ??? TdaP 08/20/2011

## 2021-04-03 NOTE — Unmapped (Signed)
I saw and evaluated the patient, participating in the key portions of the service.  I reviewed the resident’s note.  I agree with the resident’s findings and plan. Yan Okray R Alexandra Posadas, MD

## 2021-04-05 MED FILL — NUBEQA 300 MG TABLET: ORAL | 30 days supply | Qty: 120 | Fill #3

## 2021-04-13 ENCOUNTER — Telehealth: Admit: 2021-04-13 | Discharge: 2021-04-14 | Payer: MEDICARE

## 2021-04-13 DIAGNOSIS — G893 Neoplasm related pain (acute) (chronic): Principal | ICD-10-CM
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
## 2021-04-16 NOTE — Unmapped (Signed)
Outpatient Oncology Social Work      Referral: Burtis Junes, FNP. Pt in need of assistance with utility bills. Pt's daughter and emergency contact, [name], is best contact.    SW called [name] and left a voicemail introducing SW, explaining SW's role, and giving SW's contact information.    Shea Stakes, CCM  Oncology Outpatient Social Worker  [phone]

## 2021-04-19 NOTE — Unmapped (Signed)
Outpatient Oncology Social Work  Follow Up     SW called pt's daughter and emergency contact, [name], and was unable to reach her. SW previously left a voicemail  introducing SW, explaining SW's role, and giving SW's contact information.    Shea Stakes, CCM  Oncology Outpatient Social Worker  [phone]

## 2021-04-21 NOTE — Unmapped (Signed)
Patient has enough medication on hand, rescheduling refill call for 1/26

## 2021-05-01 DIAGNOSIS — C61 Malignant neoplasm of prostate: Principal | ICD-10-CM

## 2021-05-06 ENCOUNTER — Ambulatory Visit
Admit: 2021-05-06 | Discharge: 2021-05-06 | Payer: MEDICARE | Attending: Student in an Organized Health Care Education/Training Program | Primary: Student in an Organized Health Care Education/Training Program

## 2021-05-06 ENCOUNTER — Other Ambulatory Visit: Admit: 2021-05-06 | Discharge: 2021-05-06 | Payer: MEDICARE

## 2021-05-06 ENCOUNTER — Institutional Professional Consult (permissible substitution): Admit: 2021-05-06 | Discharge: 2021-05-06 | Payer: MEDICARE

## 2021-05-06 ENCOUNTER — Ambulatory Visit
Admit: 2021-05-06 | Discharge: 2021-05-06 | Payer: MEDICARE | Attending: Hematology & Oncology | Primary: Hematology & Oncology

## 2021-05-06 DIAGNOSIS — C61 Malignant neoplasm of prostate: Principal | ICD-10-CM

## 2021-05-06 DIAGNOSIS — G893 Neoplasm related pain (acute) (chronic): Principal | ICD-10-CM

## 2021-05-06 DIAGNOSIS — C7951 Secondary malignant neoplasm of bone: Principal | ICD-10-CM

## 2021-05-06 DIAGNOSIS — Z515 Encounter for palliative care: Principal | ICD-10-CM

## 2021-05-06 LAB — BASIC METABOLIC PANEL
ANION GAP: 10 mmol/L (ref 5–14)
BLOOD UREA NITROGEN: 38 mg/dL — ABNORMAL HIGH (ref 9–23)
BUN / CREAT RATIO: 22
CALCIUM: 9.5 mg/dL (ref 8.7–10.4)
CHLORIDE: 107 mmol/L (ref 98–107)
CO2: 23 mmol/L (ref 20.0–31.0)
CREATININE: 1.72 mg/dL — ABNORMAL HIGH
EGFR CKD-EPI (2021) MALE: 41 mL/min/{1.73_m2} — ABNORMAL LOW (ref >=60)
GLUCOSE RANDOM: 94 mg/dL (ref 70–179)
POTASSIUM: 3.6 mmol/L (ref 3.5–5.1)
SODIUM: 140 mmol/L (ref 135–145)

## 2021-05-06 LAB — PSA: PROSTATE SPECIFIC ANTIGEN: 13.84 ng/mL — ABNORMAL HIGH (ref 0.00–4.00)

## 2021-05-06 LAB — PHOSPHORUS: PHOSPHORUS: 3.4 mg/dL (ref 2.4–5.1)

## 2021-05-06 LAB — MAGNESIUM: MAGNESIUM: 1.6 mg/dL (ref 1.6–2.6)

## 2021-05-06 MED ORDER — DICLOFENAC 1 % TOPICAL GEL
Freq: Two times a day (BID) | TOPICAL | 2 refills | 25.00000 days | Status: CP | PRN
Start: 2021-05-06 — End: 2022-05-06
  Filled 2021-05-06: qty 100, 25d supply, fill #0

## 2021-05-06 MED ADMIN — leuprolide acetate (6 month) (ELIGARD) injection 45 mg: 45 mg | SUBCUTANEOUS | @ 14:00:00 | Stop: 2021-05-06

## 2021-05-06 MED FILL — ELIQUIS 5 MG TABLET: ORAL | 30 days supply | Qty: 60 | Fill #1

## 2021-05-06 MED FILL — HYDROCHLOROTHIAZIDE 12.5 MG TABLET: ORAL | 30 days supply | Qty: 30 | Fill #1

## 2021-05-06 NOTE — Unmapped (Signed)
OUTPATIENT ONCOLOGY PALLIATIVE CARE    Principal Diagnosis: [name] is a 75 y.o. 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 Complicated by co-morbid acute and chronic conditions including??low back pain,??acute on chronic kidney disease,??PE, tracheal stenosis, alcohol use disorder,??and chronic dilated transverse colon.    Assessment/Plan:   # Pain: Seems primarily somatic in setting of likely knee OA. Well-controlled with minimal medication. Patient rarely taking oxycodone, does not need refill.  - Ok to continue oxycodone PRN       - Consider discontinuing at future visit   - Rx voltaren gel BID PRN  - Rec conservative management (brace, elevation, heat/cold)  - No PO NSAIDs given AC use and cardiac hx  - No APAP given allergy    # ACP: Patient has not thought about the care he would want to receive were he to get sicker or be unable to communicate, but is agreeable to reviewing information about this.  - Prepare for your Care packet provided     #Controlled substances risk management:  ??? Patient has a signed pain medication agreement with Outpatient Palliative Care, completed on 11/02/20, as per standard of care.  ??? [place] database was reviewed today and it was appropriate.  ??? Urine drug screen was not performed at this visit. Findings: not applicable.  ??? Patient has received information about safe storage and administration of medications.  ??? Patient has received a prescription for narcan; is not applicable.     F/u: 3 months    ----------------------------------------  Referring Provider: Dr. Philomena Course  Oncology Team: [facility]  PCP: Mills Koller, MD    HPI: Adam Hoover is a pleasant 75 yo M with a h/o metastatic prostate cancer. He is doing very well currently. His disease is under good control and he remains independent and able to do things that he enjoys like yardwork and cooking.    Symptom Review:  General: Doing well  Pain: Minimal, primarily in knee (L>R)  Fatigue: Minimal  Mobility: Sometimes limited by pain  Sleep: Good  Appetite: Good  Nausea: None  Bowel function: Has normal BMs 1-2x/day  Dyspnea: None  Secretions: None  Mood: Good    Palliative Performance Scale: 70% - Ambulation: Reduced / unable to do normal work, some evidence of disease / Self-Care: Full / Intake: Normal or reduced / Level of Conscious: Full    Coping/Support Issues: Patient is close to his daughter [name], and likes to think of the positive side of things, accept responsibility for his role in his illness, and take things day by day. The holidays are difficult for him because his mother died around Christmas.    Goals of Care: Cancer-directed therapy    Social History: Worked as a Nutritional therapist.  Name of primary support: Daughter [name]  Occupation:  Hobbies: Cooking, yardwork  Current residence / distance from [place]    Advance Care Planning:   HCPOA: Bary Richard  Natural surrogate decision maker:  Living Will: None    Objective     Allergies:   Allergies   Allergen Reactions   ??? Acetaminophen Hives     Per pt report Only, tolerated well.    ??? Penicillins Hives   ??? Lisinopril      Angioedema        Family History:  Cancer-related family history includes Cancer in his father.  He indicated that his mother is deceased. He indicated that his father is deceased. He indicated that all  of his three brothers are alive. He indicated that his daughter is alive. He indicated that the status of his neg hx is unknown.    REVIEW OF SYSTEMS:  A comprehensive review of 14 systems was negative except for pertinent positives noted in HPI.    PHYSICAL EXAM:   VS reviewed in EPIC.  GEN: Obese, well-developed, sitting in a transport chair, comfortable-appearing  PSYCH: Good mood, pleasant affect  HEENT: Moist MMs  CV: RRR, no r/g/m  LUNGS: CTAB, slightly increased effort  ABD: Soft, NT, normal bowel sounds  SKIN: No rashes on visible skin  EXT: +L knee crepitus, normal LE ROM, no knee joint line TTP, no palpable effusion   NEURO: Conversant, alert/oriented     Lab Results   Component Value Date    CREATININE 1.72 (H) 05/06/2021     Lab Results   Component Value Date    ALKPHOS 1,096 (H) 10/09/2020    BILITOT 0.3 10/09/2020    BILIDIR 0.20 10/09/2020    PROT 7.2 10/09/2020    ALBUMIN 2.8 (L) 10/09/2020    ALT 21 10/09/2020    AST 23 10/09/2020        I personally spent 35 minutes face-to-face and non-face-to-face in the care of this patient, which includes all pre, intra, and post visit time on the date of service.     Barbette Merino MD MPH  Hospice and Palliative Medicine Fellow

## 2021-05-06 NOTE — Unmapped (Addendum)
Lab Results   Component Value Date    PSA 13.84 (H) 05/06/2021    PSA 22.17 (H) 03/11/2021    PSA 91.11 (H) 12/29/2020    PSA 162.07 (H) 12/01/2020    PSA 131.54 (H) 11/02/2020    PSA 1,099.46 (H) 10/01/2020      Continue with the same treatment.    Please call [phone] to reach my nurse navigator Mauricia Area for any issues.    For emergencies on Nights, Weekends and Holidays  Call [phone] for help.      Griffin Basil, MD, PhD  Associate Professor of Medicine  Division of Hematology-Oncology    [facility]  Genitourinary Oncology Clinic  Nurse Navigator: Mauricia Area  Fax: [phone]     What is genetic testing, and how it is used?  - Genes are instructions that help your body grow and develop. Some genes specifically help prevent cancer. We are ordering genetic testing of 17 genes, including BRCA1 and BRCA2.   - Genetic testing checks to see if these ???cancer prevention??? genes are working correctly.  Some people have a misspelling in their genes that puts them at an increased risk for prostate cancer and potentially other cancer  - Your blood sample will be collected and shipped to a lab called [facility]. Results of this test take 3 weeks to come back. You may see this result in your [facility] before your next appointment with your provider. If you have questions about this result, please contact your provider.  - This testing will be used to help direct your medical care, both in management of your current cancer and possibly to guide your future cancer screening. The test result can also provide information about the cancer risk for your family members.    How much does the test cost out of pocket?  - Most people pay less than $100 for genetic testing. Your medical insurance will most likely cover all or a large portion of the cost.   - Once [facility] receives your sample, they will contact you with an estimated out-of-pocket cost via text to a mobile number or email. If the cost of testing is too high through insurance, the laboratory has a self-pay price of $250. In this case, you would have to contact [facility] and choose the self-pay option. [facility] also has a financial assistance program that can help with the cost.   - If you are concerned about the cost of your genetic testing, please contact [facility] at [phone] or email [email or web address]     What are the possible results?  - Positive - If one of your cancer prevention genes doesn???t work, we know certain treatment options may work better. In addition, your relatives could have testing to see if they???re at a higher risk of developing cancer in their lifetime.  - Negative -  If you don???t have a gene change, then other treatment options may be recommended.   - Variant of Uncertain Significance (VUS) - Sometimes we get an unclear result, but these are typically treated as negative results. The lab will follow this result over time for additional information about the variant in the scientific literature.     Any possible risks?  - Risks - this test may cost money out of pocket--maximum $250, although most patients pay nothing since the test is often covered by insurance. Some patients are also concerned with privacy of results. There are laws that protect the privacy of your genetic test  results.   - Benefits - this test may help your doctor choose the best treatment for your cancer. It may also provide additional information on cancer risks for you and your family.     What is an electronic consult?  - To help [place] interpret any of your genetic test results, we would like to ask our colleagues in [place] to help [place]. Their team will look at your medical record and ensure we are ordering the best test for you. This is called an ???electronic consult???. This will not require a separate appointment for most patients.   - Most patients will not have an out-of-pocket expense for this. If your insurance does not cover this service, the maximum bill you could receive would be around ~$100. Most patients will pay far less than that.
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
## 2021-05-06 NOTE — Unmapped (Signed)
Pt tolerated Eligard 45mg  injection administered in R abdomen without difficulty. Band aid and gauze applied. [facility] ambulatory,steady gait, NAD, no questions, complaints, nor concerns voiced at d/c.

## 2021-05-11 NOTE — Unmapped (Signed)
[facility] Refill Coordination Note    Specialty Medication(s) to be Shipped:   Hematology/Oncology: Nubeqa    Other medication(s) to be shipped: No additional medications requested for fill at this time     Adam Hoover, DOB: [birth year]  Phone: [phone] (home)       All above HIPAA information was verified with patient.     Was a Nurse, learning disability used for this call? No    Completed refill call assessment today to schedule patient's medication shipment from the [facility] [phone]).  All relevant notes have been reviewed.     Specialty medication(s) and dose(s) confirmed: Regimen is correct and unchanged.   Changes to medications: Adam Hoover reports no changes at this time.  Changes to insurance: No  New side effects reported not previously addressed with a pharmacist or physician: None reported  Questions for the pharmacist: No    Confirmed patient received a Conservation officer, historic buildings and a Surveyor, mining with first shipment. The patient will receive a drug information handout for each medication shipped and additional FDA Medication Guides as required.       DISEASE/MEDICATION-SPECIFIC INFORMATION        N/A    SPECIALTY MEDICATION ADHERENCE     Medication Adherence    Patient reported X missed doses in the last month: 0  Specialty Medication: Nubeqa 300 mg  Patient is on additional specialty medications: No  Informant: patient              Were doses missed due to medication being on hold? No    Nubeqa 300 mg: 14 days of medicine on hand       REFERRAL TO PHARMACIST     Referral to the pharmacist: Not needed      [facility]     Shipping address confirmed in Epic.     Delivery Scheduled: Yes, Expected medication delivery date: 05/26/21.     Medication will be delivered via Next Day Courier to the prescription address in Epic [place].    [name]   [facility] Specialty Technician

## 2021-05-18 ENCOUNTER — Ambulatory Visit: Admit: 2021-05-18 | Discharge: 2021-05-19 | Payer: MEDICARE

## 2021-05-18 DIAGNOSIS — C61 Malignant neoplasm of prostate: Principal | ICD-10-CM

## 2021-05-18 DIAGNOSIS — Z86711 Personal history of pulmonary embolism: Principal | ICD-10-CM

## 2021-05-18 DIAGNOSIS — I10 Essential (primary) hypertension: Principal | ICD-10-CM

## 2021-05-18 NOTE — Unmapped (Signed)
Assessment/Plan:     Body mass index is 33.05 kg/m??.    DOAC: Eliquis 5 mg twice daily    Dose appropriate based on indication, age, weight, renal/liver function, bleed risk, BMI, concomitant meds: yes  DOAC trough indicated: no  GI protection needed based on age, h/o GI complication, concomitant antiplatelet tx: no    1.  History of PE- current creatinine clearance is 58, continue Eliquis 5 mg twice daily for now.  Given his active cancer, we will continue anticoagulation until instructed otherwise by heme-onc.  Has had fairly recent CBC, creatinine, LFTs, plan to repeat 6 months.  2.  Hypertension- much improved on new HCTZ start 12.5 mg daily in 11/22.  BMP 05/06/2021 unremarkable from HCTZ standpoint.  3.  Hypokalemia- currently at goal.    Follow up:  6 mo CBC, CMP unless done prior    Subject/Objective:     Patient: Adam Hoover 75 y.o.    Medical Record Number:  [id number] C    PRIMARY CARE PHYSICIAN: Mills Koller, MD    NF:[id number] VTE, htn, K    PMHx:   Patient Active Problem List   Diagnosis   ??? Age-related cataract   ??? Tracheal stenosis   ??? 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 Diverticulosis of large intestine   ??? History of lower GI bleeding   ??? Hypertension   ??? Macrocytic anemia   ??? Alcohol abuse   ??? Bleeding internal hemorrhoids   ??? Angioedema   ??? Lower extremity edema   ??? Low back pain   ??? Chronic kidney disease (CKD)   ??? Weight loss   ??? Malignant neoplasm of prostate (CMS-HCC)   ??? Cancer related pain   ??? Bone metastasis (CMS-HCC)   ??? Venous stasis dermatitis of both lower extremities       Current Outpatient Medications on File Prior to Visit   Medication Sig Dispense Refill   ??? apixaban (ELIQUIS) 5 mg Tab Take 1 tablet by mouth two (2) times a day. 60 tablet 11   ??? carvediloL (COREG) 3.125 MG tablet Take 2 tablets (6.25 mg total) by mouth Two (2) times a day. 120 tablet 11   ??? darolutamide (NUBEQA) 300 mg tablet Take 2 tablets (600 mg total) by mouth in the morning and 2 tablets (600 mg total) in the evening. Take with meals. Take with food. Swallow tablets whole.. 120 tablet 11   ??? diclofenac sodium (VOLTAREN) 1 % gel Apply 2 g topically two (2) times a day as needed for arthritis. 100 g 2   ??? ferrous sulfate 27 mg iron Tab Take 1 tablet by mouth daily.      ??? hydroCHLOROthiazide (HYDRODIURIL) 12.5 MG tablet Take 1 tablet (12.5 mg total) by mouth daily. 30 tablet 11   ??? naloxone (NARCAN) 4 mg nasal spray One spray in either nostril once for known/suspected opioid overdose. May repeat every 2-3 minutes in alternating nostril til EMS arrives 2 each 0   ??? oxyCODONE (ROXICODONE) 5 MG immediate release tablet Take 1 tablet (5 mg total) by mouth two (2) times a day as needed for pain. 60 tablet 0   ??? polyethylene glycol (MIRALAX) 17 gram packet Mix 1 packet (17 g) in 4 to 8 ounces of lliquid (tea, juice, or water) and drink by mouth Two (2) times a day. 60 packet 0   ??? senna (SENOKOT) 8.6 mg tablet Take 1 tablet by mouth Two (2) times a day. 60 tablet 0   ???  triamcinolone (KENALOG) 0.1 % ointment Apply topically Two (2) times a day. 80 g 0     No current facility-administered medications on file prior to visit.        General:  Well appearing, no acute distress    Signs/Symptoms bleeding or bruising: no    Signs/Symptoms of Potential Embolic Events: no       Medications reviewed including OTC and herbals:yes    The patient set a personal goal for anticoagulation management today:Take warfarin daily as recommended.    Medication adherence and barriers to the treatment plan have been addressed. Opportunities to optimize healthy behaviors have been discussed. Patient / caregiver voiced understanding.      Additional History:     Phy EX: NAD, a/ox3, lungs CTA bilat, unappreciable murmurs, abdomen nontender

## 2021-05-24 MED FILL — NUBEQA 300 MG TABLET: ORAL | 30 days supply | Qty: 120 | Fill #4

## 2021-06-15 MED FILL — CARVEDILOL 3.125 MG TABLET: ORAL | 30 days supply | Qty: 120 | Fill #1

## 2021-06-15 MED FILL — HYDROCHLOROTHIAZIDE 12.5 MG TABLET: ORAL | 30 days supply | Qty: 30 | Fill #2

## 2021-06-16 NOTE — Unmapped (Signed)
[facility] Refill Coordination Note    Specialty Medication(s) to be Shipped:   Hematology/Oncology: Nubeqa    Other medication(s) to be shipped: No additional medications requested for fill at this time     Adam Hoover, DOB: [birth year]  Phone: [phone] (home)       All above HIPAA information was verified with patient.     Was a Nurse, learning disability used for this call? No    Completed refill call assessment today to schedule patient's medication shipment from the [facility] [phone]).  All relevant notes have been reviewed.     Specialty medication(s) and dose(s) confirmed: Regimen is correct and unchanged.   Changes to medications: Devonn reports no changes at this time.  Changes to insurance: No  New side effects reported not previously addressed with a pharmacist or physician: None reported  Questions for the pharmacist: No    Confirmed patient received a Conservation officer, historic buildings and a Surveyor, mining with first shipment. The patient will receive a drug information handout for each medication shipped and additional FDA Medication Guides as required.       DISEASE/MEDICATION-SPECIFIC INFORMATION        N/A    SPECIALTY MEDICATION ADHERENCE     Medication Adherence    Patient reported X missed doses in the last month: 0  Specialty Medication: Nubeqa 300 mg  Patient is on additional specialty medications: No              Were doses missed due to medication being on hold? No    Nubeqa 300 mg: 7 days of medicine on hand        REFERRAL TO PHARMACIST     Referral to the pharmacist: Not needed      [facility]     Shipping address confirmed in Epic.     Delivery Scheduled: Yes, Expected medication delivery date: 06/22/21.     Medication will be delivered via Next Day Courier to the prescription address in Epic [place].    Unk Lightning   [facility] Specialty Technician

## 2021-06-21 MED FILL — NUBEQA 300 MG TABLET: ORAL | 30 days supply | Qty: 120 | Fill #5

## 2021-07-09 ENCOUNTER — Ambulatory Visit
Admit: 2021-07-09 | Discharge: 2021-07-09 | Payer: MEDICARE | Attending: Student in an Organized Health Care Education/Training Program | Primary: Student in an Organized Health Care Education/Training Program

## 2021-07-09 DIAGNOSIS — I10 Essential (primary) hypertension: Principal | ICD-10-CM

## 2021-07-09 NOTE — Unmapped (Signed)
[facility] at [facility]       Type of visit:  face to face    Reason for visit: follow up     Questions / Concerns that need to be addressed:     General Consent to Treat (GCT) for non-epic video visits only: Verbal consent        Screening BP- 132/78-73                Allergies reviewed: Yes    Medication reviewed: Yes  Pended refills? No        HCDM reviewed and updated in Epic:    We are working to make sure all of our patients??? wishes are updated in Epic and part of that is documenting a Environmental health practitioner for each patient  A Health Care Decision Maker is someone you choose who can make health care decisions for you if you are not able - who would you most want to do this for you????  is already up to date.              COVID Vaccination:  If Care Gap for COVID vaccine is present:  Have you received a COVID vaccine? yes  If yes: How many doses have you received? 0/1/2: 2   Type of Vaccine received? Pfizer   Date of 1st dose:    Date of 2nd dose:      __________________________________________________________________________________________

## 2021-07-09 NOTE — Unmapped (Signed)
Internal Medicine Clinic Visit    Reason for visit: Hypertension Follow Up    A/P:           1. Primary hypertension    His blood pressure is well-controlled at today's visit. He does not have any issues with hypotension, dizziness, or lightheadedness at home. As his blood pressure is under better control today, we will plan to space out follow up. He will continue to follow up with his oncology and palliative care teams at the cancer hospital.   -Continue carvedilol 6.25mg  BID   -Continue hydrochlorothiazide 12.5mg  daily    Return in about 6 months (around 01/06/2022).    Staffed with Dr. Marin Roberts, discussed    __________________________________________________________    HPI:    He has been doing quite well since his last visit. He continues on treatment for his prostate cancer and has been tolerating this well.  He has no acute concerns at today's visit. His blood pressure is much improved today after starting hydrochlorothiazide. He has not had any signs/symptoms of hypotension at home.   __________________________________________________________    Medications:  Reviewed in EPIC  __________________________________________________________    Physical Exam:   Vital Signs:  Vitals:    07/09/21 0954   BP: 132/78   Pulse: 73   Temp: 35.5 ??C (95.9 ??F)   SpO2: 95%   Weight: (!) 109.8 kg (242 lb)   Height: 180.3 cm (5' 11)     PTHomeBP    The patient???s Average Home Blood Pressure during the last two weeks is :    /       Gen: Well appearing, elderly, in NAD  CV: RRR, no murmurs  Pulm: CTAB, no crackles or wheezes, comfortable WOB on RA  Abd: Soft, NTND, normal BS  Ext: 1+ tense edema of BLE with 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 Opportunities to optimize healthy behaviors have been discussed. Patient / caregiver voiced understanding.    Ocie Doyne, MD  [facility] - PGY-2  Pager: [phone]

## 2021-07-09 NOTE — Unmapped (Addendum)
Thank you for visiting the clinic today! Here is what we discussed:    Your blood pressure looks great today! We won't make any changes to your blood pressure regimen.   You can keep caring for your leg swelling and rash as you have been. If this worsens let me know!  We'll plan to see you back in 6 months, or sooner if needed

## 2021-07-15 NOTE — Unmapped (Addendum)
[facility] Clinical Assessment & Refill Coordination Note    Adam Hoover, : 1946/01/12  Phone: [phone] (home)     All above HIPAA information was verified with patient.     Was a Nurse, learning disability used for this call? No    Specialty Medication(s):   Hematology/Oncology: Nubeqa     Current Outpatient Medications   Medication Sig Dispense Refill   ??? apixaban (ELIQUIS) 5 mg Tab Take 1 tablet by mouth two (2) times a day. 60 tablet 11   ??? carvediloL (COREG) 3.125 MG tablet Take 2 tablets (6.25 mg total) by mouth Two (2) times a day. 120 tablet 11   ??? darolutamide (NUBEQA) 300 mg tablet Take 2 tablets (600 mg total) by mouth in the morning and 2 tablets (600 mg total) in the evening. Take with meals. Take with food. Swallow tablets whole.. 120 tablet 11   ??? diclofenac sodium (VOLTAREN) 1 % gel Apply 2 g topically two (2) times a day as needed for arthritis. 100 g 2   ??? ferrous sulfate 27 mg iron Tab Take 1 tablet by mouth daily.      ??? hydroCHLOROthiazide (HYDRODIURIL) 12.5 MG tablet Take 1 tablet (12.5 mg total) by mouth daily. 30 tablet 11   ??? naloxone (NARCAN) 4 mg nasal spray One spray in either nostril once for known/suspected opioid overdose. May repeat every 2-3 minutes in alternating nostril til EMS arrives 2 each 0   ??? oxyCODONE (ROXICODONE) 5 MG immediate release tablet Take 1 tablet (5 mg total) by mouth two (2) times a day as needed for pain. 60 tablet 0   ??? polyethylene glycol (MIRALAX) 17 gram packet Mix 1 packet (17 g) in 4 to 8 ounces of lliquid (tea, juice, or water) and drink by mouth Two (2) times a day. 60 packet 0   ??? senna (SENOKOT) 8.6 mg tablet Take 1 tablet by mouth Two (2) times a day. 60 tablet 0   ??? triamcinolone (KENALOG) 0.1 % ointment Apply topically Two (2) times a day. 80 g 0     No current facility-administered medications for this visit.        Changes to medications: [name] reports no changes at this time.    Allergies   Allergen Reactions   ??? Acetaminophen Hives     Per pt report Only, tolerated well.    ??? Penicillins Hives   ??? Lisinopril      Angioedema        Changes to allergies: No    SPECIALTY MEDICATION ADHERENCE     Nubeqa 300 mg: 14 days of medicine on hand       Medication Adherence    Patient reported X missed doses in the last month: 0  Specialty Medication: Nubeqa 300mg  2 tablets AM, 2 tablets PM  Patient is on additional specialty medications: No  Patient is on more than two specialty medications: No  Any gaps in refill history greater than 2 weeks in the last 3 months: no  Demonstrates understanding of importance of adherence: no  Informant: patient  Reliability of informant: reliable  Provider-estimated medication adherence level: good  Confirmed plan for next specialty medication refill: delivery by pharmacy  Refills needed for supportive medications: not needed          Specialty medication(s) dose(s) confirmed: Regimen is correct and unchanged.     Are there any concerns with adherence? No    Adherence counseling provided? Not needed  CLINICAL MANAGEMENT AND INTERVENTION      Clinical Benefit Assessment:    Do you feel the medicine is effective or helping your condition? Yes    Clinical Benefit counseling provided? Not needed    Adverse Effects Assessment:    Are you experiencing any side effects? No    Are you experiencing difficulty administering your medicine? No    Quality of Life Assessment:    Quality of Life    Rheumatology  Oncology  1. What impact has your specialty medication had on the reduction of your daily pain or discomfort level?: Some  2. On a scale of 1-10, how would you rate your ability to manage side effects associated with your specialty medication? (1=no issues, 10 = unable to take medication due to side effects): 1  Dermatology  Cystic Fibrosis          How many days over the past month did your prostate cancer   keep you from your normal activities? For example, brushing your teeth or getting up in the morning. 0    Have you discussed this with your provider? Not needed    Acute Infection Status:    Acute infections noted within Epic:  No active infections  Patient reported infection: None    Therapy Appropriateness:    Is therapy appropriate and patient progressing towards therapeutic goals? Yes, therapy is appropriate and should be continued    DISEASE/MEDICATION-SPECIFIC INFORMATION      N/A    PATIENT SPECIFIC NEEDS     - Does the patient have any physical, cognitive, or cultural barriers? No    - Is the patient high risk? Yes, patient is taking oral chemotherapy. Appropriateness of therapy as been assessed    - Does the patient require a Care Management Plan? No     SOCIAL DETERMINANTS OF HEALTH     At the [facility], we have learned that life circumstances - like trouble affording food, housing, utilities, or transportation can affect the health of many of our patients.   That is why we wanted to ask: are you currently experiencing any life circumstances that are negatively impacting your health and/or quality of life? Patient declined to answer    Social Determinants of Health     Food Insecurity: No Food Insecurity   ??? Worried About Programme researcher, broadcasting/film/video in the Last Year: Never true   ??? Ran Out of Food in the Last Year: Never true   Tobacco Use: Medium Risk   ??? Smoking Tobacco Use: Former   ??? Smokeless Tobacco Use: Never   ??? Passive Exposure: Not on file   Transportation Needs: No Transportation Needs   ??? Lack of Transportation (Medical): No   ??? Lack of Transportation (Non-Medical): No   Alcohol Use: Not At Risk   ??? How often do you have a drink containing alcohol?: Monthly or less   ??? How many drinks containing alcohol do you have on a typical day when you are drinking?: 1 - 2   ??? How often do you have 5 or more drinks on one occasion?: Not on file   Housing/Utilities: Low Risk    ??? Within the past 12 months, have you ever stayed: outside, in a car, in a tent, in an overnight shelter, or temporarily in someone else's home (i.e. couch-surfing)?: No   ??? Are you worried about losing your housing?: No   ??? Within the past 12 months, have you been unable to get  utilities (heat, electricity) when it was really needed?: No   Substance Use: Low Risk    ??? Taken prescription drugs for non-medical reasons: Never   ??? Taken illegal drugs: Never   ??? Patient indicated they have taken drugs in the past year for non-medical reasons: Yes, [positive answer(s)]: Not on file   Financial Resource Strain: Low Risk    ??? Difficulty of Paying Living Expenses: Not very hard   Physical Activity: Sufficiently Active   ??? Days of Exercise per Week: 7 days   ??? Minutes of Exercise per Session: 40 min   Health Literacy: Low Risk    ??? : Never   Stress: No Stress Concern Present   ??? Feeling of Stress : Not at all   Intimate Partner Violence: Not At Risk   ??? Fear of Current or Ex-Partner: No   ??? Emotionally Abused: No   ??? Physically Abused: No   ??? Sexually Abused: No   Depression: Not at risk   ??? PHQ-2 Score: 0   Social Connections: Moderately Isolated   ??? Frequency of Communication with Friends and Family: More than three times a week   ??? Frequency of Social Gatherings with Friends and Family: More than three times a week   ??? Attends Religious Services: 1 to 4 times per year   ??? Active Member of Clubs or Organizations: No   ??? Attends Banker Meetings: Never   ??? Marital Status: Separated       Would you be willing to receive help with any of the needs that you have identified today? Not applicable       SHIPPING     Specialty Medication(s) to be Shipped:   Hematology/Oncology: Nubeqa    Other medication(s) to be shipped: No additional medications requested for fill at this time     Changes to insurance: No    Delivery Scheduled: Yes, Expected medication delivery date: 07/23/21.     Medication will be delivered via Next Day Courier to the confirmed prescription address in [facility].    The patient will receive a drug information handout for each medication shipped and additional FDA Medication Guides as required.  Verified that patient has previously received a Conservation officer, historic buildings and a Surveyor, mining.    The patient or caregiver noted above participated in the development of this care plan and knows that they can request review of or adjustments to the care plan at any time.      All of the patient's questions and concerns have been addressed.    Dedrick [employer]   [facility] Specialty Pharmacist    I reviewed this patient case and all documentation provided by the learner and was readily available for consultation during their interaction with the patient.  I agree with the assessment and plan listed below.    Breck Coons [facility] Specialty Pharmacist

## 2021-07-22 MED FILL — NUBEQA 300 MG TABLET: ORAL | 30 days supply | Qty: 120 | Fill #6

## 2021-07-26 MED FILL — HYDROCHLOROTHIAZIDE 12.5 MG TABLET: ORAL | 30 days supply | Qty: 30 | Fill #3

## 2021-07-26 MED FILL — ELIQUIS 5 MG TABLET: ORAL | 30 days supply | Qty: 60 | Fill #2

## 2021-08-05 ENCOUNTER — Ambulatory Visit
Admit: 2021-08-05 | Discharge: 2021-08-05 | Payer: MEDICARE | Attending: Student in an Organized Health Care Education/Training Program | Primary: Student in an Organized Health Care Education/Training Program

## 2021-08-05 ENCOUNTER — Ambulatory Visit
Admit: 2021-08-05 | Discharge: 2021-08-05 | Payer: MEDICARE | Attending: Hematology & Oncology | Primary: Hematology & Oncology

## 2021-08-05 ENCOUNTER — Other Ambulatory Visit: Admit: 2021-08-05 | Discharge: 2021-08-05 | Payer: MEDICARE

## 2021-08-05 DIAGNOSIS — C61 Malignant neoplasm of prostate: Principal | ICD-10-CM

## 2021-08-05 DIAGNOSIS — Z515 Encounter for palliative care: Principal | ICD-10-CM

## 2021-08-05 LAB — BASIC METABOLIC PANEL
ANION GAP: 10 mmol/L (ref 5–14)
BLOOD UREA NITROGEN: 35 mg/dL — ABNORMAL HIGH (ref 9–23)
BUN / CREAT RATIO: 19
CALCIUM: 9.2 mg/dL (ref 8.7–10.4)
CHLORIDE: 107 mmol/L (ref 98–107)
CO2: 22 mmol/L (ref 20.0–31.0)
CREATININE: 1.8 mg/dL — ABNORMAL HIGH
EGFR CKD-EPI (2021) MALE: 39 mL/min/{1.73_m2} — ABNORMAL LOW (ref >=60)
GLUCOSE RANDOM: 98 mg/dL (ref 70–179)
POTASSIUM: 4 mmol/L (ref 3.5–5.1)
SODIUM: 139 mmol/L (ref 135–145)

## 2021-08-05 LAB — PSA: PROSTATE SPECIFIC ANTIGEN: 7.77 ng/mL — ABNORMAL HIGH (ref 0.00–4.00)

## 2021-08-05 LAB — PHOSPHORUS: PHOSPHORUS: 3.5 mg/dL (ref 2.4–5.1)

## 2021-08-05 LAB — MAGNESIUM: MAGNESIUM: 1.4 mg/dL — ABNORMAL LOW (ref 1.6–2.6)
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
## 2021-08-05 NOTE — Unmapped (Signed)
Patrient arrived to Oncology lab. A Phlebotomy stick was performed with a 23 g needle in the Lf. hand at 0924 by JS, RN. Labs were collected and sent for analysis.

## 2021-08-05 NOTE — Unmapped (Signed)
OUTPATIENT ONCOLOGY PALLIATIVE CARE    Principal Diagnosis: Mr.??Conly??is a 76 y.o.??male??with presumed metastatic prostate cancer, diagnosed in May of 2022. Complicated by co-morbid acute and chronic conditions including??low back pain,??acute on chronic kidney disease,??PE, tracheal stenosis, alcohol use disorder,??and chronic dilated transverse colon.  ??  Assessment/Plan:     # Pain: Some pain in back that is moderate, usually related to exertion, and self-limiting. Patient has not been taking oxycodone or other PRNs for this, in part d/t concerns about sedation.   - Recommend pre-medication with oxycodone 2.5mg  (half-dose) prior to exertion/activity       - No refill needed at this time; patient will notify us if he needs renewal  - Continue conservative management (e.g. ice packs)  - No PO NSAIDs given AC use and cardiac hx  - No APAP given allergy  ??  # ACP: Patient is still reviewing the Prepare for Your Care packet with his family.   - Follow up next visit     #Controlled substances risk management:  ??? Patient has a signed pain medication agreement with Outpatient Palliative Care, completed on 11/02/20, as per standard of care.  ??? [place] database was reviewed today and it was appropriate.  ??? Urine drug screen was not performed at this visit. Findings: not applicable.  ??? Patient has received information about safe storage and administration of medications.  ??? Patient has received a prescription for narcan; is not applicable.     F/u: 3 months    ----------------------------------------  Referring Provider:??Dr. Philomena Course  Oncology Team: GU oncology  PCP:??Jacob Mattie Marlin, MD    HPI: Adam Hoover is a pleasant 76 yo M with a h/o metastatic prostate cancer. He is doing very well currently.     Symptom Review:  General: Doing well overall, still lives independently and is able to care for himself and his home, including doing yardwork and cooking  Pain: Minimal, usually related to yardwork/mowing the lawn; no radiation/weakness/neuropathy; patient manages symptoms with rest and ice packs; he doesn't use oxycodone because it makes him too sleepy  Fatigue: Minimal  Mobility: Patient able to ambulate, get around his house, and do yardwork without significant mobility issues  Sleep: Good  Appetite: Good  Nausea: None  Bowel function: Good  Dyspnea: None  Mood: Overall good    Palliative Performance Scale: 80% - Ambulation: Full / Normal Activity with effort, some evidence of disease / Self-Care:Full / Intake: Normal or reduced / Level of Conscious: Full    Coping/Support Issues: Patient is close to his daughter [name], and likes to think of the positive side of things, accept responsibility for his role in his illness, and take things day by day.     Goals of Care: Cancer-directed therapy    Social History: Lives alone (daughter lives 15 minutes away); Worked as a Nutritional therapist.  Name of primary support: Daughter [name]  Occupation:  Hobbies: Cooking, yardwork  Current residence / distance from [facility]: [employer]    Advance Care Planning: Patient reviewing ACP information; not ready to discuss today  HCPOA:  Natural surrogate decision maker:  Living Will:  ACP note:     Objective     Allergies:   Allergies   Allergen Reactions   ??? Acetaminophen Hives     Per pt report Only, tolerated well.    ??? Penicillins Hives   ??? Lisinopril      Angioedema        Family History:  Cancer-related family history includes Cancer  in his father.  He indicated that his mother is deceased. He indicated that his father is deceased. He indicated that all of his three brothers are alive. He indicated that his daughter is alive. He indicated that the status of his neg hx is unknown.      REVIEW OF SYSTEMS:  A comprehensive review of 14 systems was negative except for pertinent positives noted in HPI.    PHYSICAL EXAM:   VS reviewed in EPIC.  GEN: Obese, well-developed, sitting in a transport chair, comfortable-appearing  PSYCH: Good mood, pleasant affect  HEENT: Moist MMs  CV: No heave  LUNGS: Normal effort  ABD: No distention  SKIN: No rashes on visible skin  NEURO: Conversant, alert/oriented     Lab Results   Component Value Date    CREATININE 1.80 (H) 08/05/2021     Lab Results   Component Value Date    ALKPHOS 1,096 (H) 10/09/2020    BILITOT 0.3 10/09/2020    BILIDIR 0.20 10/09/2020    PROT 7.2 10/09/2020    ALBUMIN 2.8 (L) 10/09/2020    ALT 21 10/09/2020    AST 23 10/09/2020        I personally spent 30 minutes face-to-face and non-face-to-face in the care of this patient, which includes all pre, intra, and post visit time on the date of service.     Barbette Merino MD MPH  Hospice and Palliative Medicine Fellow

## 2021-08-05 NOTE — Unmapped (Addendum)
Lab Results   Component Value Date    PSA 13.84 (H) 05/06/2021    PSA 22.17 (H) 03/11/2021    PSA 91.11 (H) 12/29/2020    PSA 162.07 (H) 12/01/2020    PSA 131.54 (H) 11/02/2020    PSA 1,099.46 (H) 10/01/2020   It was a pleasure meeting with you today. We'll see you back in 3 months. It is okay for you to receive the Shingles vaccine. Please call [phone] to reach my nurse navigator Mauricia Area for any issues.    For emergencies on Nights, Weekends and Holidays  Call [phone] for help.      Griffin Basil, MD, PhD  Associate Professor of Medicine  Division of Hematology-Oncology    [facility]  Genitourinary Oncology Clinic  Nurse Navigator: Mauricia Area  Fax: [phone]

## 2021-08-10 ENCOUNTER — Ambulatory Visit
Admit: 2021-08-10 | Discharge: 2021-08-11 | Disposition: A | Discharge status: Home Health | Payer: MEDICARE | Admitting: Internal Medicine

## 2021-08-10 ENCOUNTER — Encounter
Admit: 2021-08-10 | Discharge: 2021-08-11 | Disposition: A | Discharge status: Home Health | Payer: MEDICARE | Attending: Internal Medicine | Admitting: Internal Medicine

## 2021-08-10 ENCOUNTER — Encounter
Admit: 2021-08-10 | Discharge: 2021-08-11 | Disposition: A | Discharge status: Home Health | Payer: MEDICARE | Admitting: Internal Medicine

## 2021-08-10 LAB — CBC W/ AUTO DIFF
BASOPHILS ABSOLUTE COUNT: 0 10*9/L (ref 0.0–0.1)
BASOPHILS RELATIVE PERCENT: 0.5 %
EOSINOPHILS ABSOLUTE COUNT: 0.1 10*9/L (ref 0.0–0.5)
EOSINOPHILS RELATIVE PERCENT: 2.6 %
HEMATOCRIT: 31.6 % — ABNORMAL LOW (ref 39.0–48.0)
HEMOGLOBIN: 10.3 g/dL — ABNORMAL LOW (ref 12.9–16.5)
LYMPHOCYTES ABSOLUTE COUNT: 1.2 10*9/L (ref 1.1–3.6)
LYMPHOCYTES RELATIVE PERCENT: 29.1 %
MEAN CORPUSCULAR HEMOGLOBIN CONC: 32.8 g/dL (ref 32.0–36.0)
MEAN CORPUSCULAR HEMOGLOBIN: 30.8 pg (ref 25.9–32.4)
MEAN CORPUSCULAR VOLUME: 94.1 fL (ref 77.6–95.7)
MEAN PLATELET VOLUME: 8.3 fL (ref 6.8–10.7)
MONOCYTES ABSOLUTE COUNT: 0.5 10*9/L (ref 0.3–0.8)
MONOCYTES RELATIVE PERCENT: 11.6 %
NEUTROPHILS ABSOLUTE COUNT: 2.4 10*9/L (ref 1.8–7.8)
NEUTROPHILS RELATIVE PERCENT: 56.2 %
PLATELET COUNT: 235 10*9/L (ref 150–450)
RED BLOOD CELL COUNT: 3.36 10*12/L — ABNORMAL LOW (ref 4.26–5.60)
RED CELL DISTRIBUTION WIDTH: 14 % (ref 12.2–15.2)
WBC ADJUSTED: 4.2 10*9/L (ref 3.6–11.2)

## 2021-08-10 LAB — FERRITIN: FERRITIN: 178.9 ng/mL

## 2021-08-10 LAB — COMPREHENSIVE METABOLIC PANEL
ALBUMIN: 3.5 g/dL (ref 3.4–5.0)
ALKALINE PHOSPHATASE: 97 U/L (ref 46–116)
ALT (SGPT): <7 U/L — ABNORMAL LOW (ref 10–49)
ANION GAP: 13 mmol/L (ref 5–14)
AST (SGOT): 16 U/L (ref <=34)
BILIRUBIN TOTAL: 0.4 mg/dL (ref 0.3–1.2)
BLOOD UREA NITROGEN: 16 mg/dL (ref 9–23)
BUN / CREAT RATIO: 10
CALCIUM: 9.5 mg/dL (ref 8.7–10.4)
CHLORIDE: 108 mmol/L — ABNORMAL HIGH (ref 98–107)
CO2: 18 mmol/L — ABNORMAL LOW (ref 20.0–31.0)
CREATININE: 1.63 mg/dL — ABNORMAL HIGH
EGFR CKD-EPI (2021) MALE: 44 mL/min/{1.73_m2} — ABNORMAL LOW (ref >=60)
GLUCOSE RANDOM: 111 mg/dL (ref 70–179)
POTASSIUM: 3.5 mmol/L (ref 3.4–4.8)
PROTEIN TOTAL: 7.7 g/dL (ref 5.7–8.2)
SODIUM: 139 mmol/L (ref 135–145)

## 2021-08-10 LAB — MAGNESIUM: MAGNESIUM: 1.3 mg/dL — ABNORMAL LOW (ref 1.6–2.6)

## 2021-08-10 LAB — LACTATE, VENOUS, WHOLE BLOOD: LACTATE BLOOD VENOUS: 1 mmol/L (ref 0.5–1.8)

## 2021-08-10 LAB — B-TYPE NATRIURETIC PEPTIDE: B-TYPE NATRIURETIC PEPTIDE: 51 pg/mL (ref <=100)

## 2021-08-10 LAB — VITAMIN B12: VITAMIN B-12: 739 pg/mL (ref 211–911)

## 2021-08-10 LAB — AMMONIA: AMMONIA: 27 umol/L (ref 11–32)

## 2021-08-10 LAB — HIGH SENSITIVITY TROPONIN I - SINGLE: HIGH SENSITIVITY TROPONIN I: 4 ng/L (ref <=53)

## 2021-08-10 MED ADMIN — vancomycin (VANCOCIN) 2000 mg IVPB in 500 mL sodium chloride 0.9% (premix): 2000 mg | INTRAVENOUS | Stop: 2021-08-10

## 2021-08-10 MED ADMIN — MORPhine injection 4 mg: 4 mg | INTRAVENOUS | @ 13:00:00 | Stop: 2021-08-10

## 2021-08-10 MED ADMIN — iohexoL (OMNIPAQUE) 350 mg iodine/mL solution 100 mL: 100 mL | INTRAVENOUS | @ 23:00:00 | Stop: 2021-08-10
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
## 2021-08-10 NOTE — Unmapped (Signed)
Immediately after or during the visit, I reviewed with the resident the medical history and the resident???s findings on physical examination.  I discussed with the resident the patient???s diagnosis and concur with the treatment plan as documented in the resident note. Bertram Savin, MD

## 2021-08-10 NOTE — Unmapped (Signed)
Pt w/ hx metastatic CA presents via [facility] w/ c/o cellulitis to LE after starting new cancer medication. EMS also reports lesions to ankles

## 2021-08-11 LAB — HEPATIC FUNCTION PANEL
ALBUMIN: 3.2 g/dL — ABNORMAL LOW (ref 3.4–5.0)
ALKALINE PHOSPHATASE: 94 U/L (ref 46–116)
ALT (SGPT): 9 U/L — ABNORMAL LOW (ref 10–49)
AST (SGOT): 16 U/L (ref <=34)
BILIRUBIN DIRECT: 0.2 mg/dL (ref 0.00–0.30)
BILIRUBIN TOTAL: 0.4 mg/dL (ref 0.3–1.2)
PROTEIN TOTAL: 7.2 g/dL (ref 5.7–8.2)

## 2021-08-11 LAB — IRON PANEL
IRON SATURATION: 14 % — ABNORMAL LOW (ref 20–55)
IRON: 41 ug/dL — ABNORMAL LOW
TOTAL IRON BINDING CAPACITY: 299 ug/dL (ref 250–425)

## 2021-08-11 LAB — PHOSPHORUS: PHOSPHORUS: 2.7 mg/dL (ref 2.4–5.1)

## 2021-08-11 LAB — MAGNESIUM: MAGNESIUM: 1.6 mg/dL (ref 1.6–2.6)

## 2021-08-11 MED ADMIN — oxyCODONE (ROXICODONE) immediate release tablet 5 mg: 5 mg | ORAL | @ 14:00:00 | Stop: 2021-08-11

## 2021-08-11 MED ADMIN — sodium chloride (NS) 0.9 % infusion: 20 mL/h | INTRAVENOUS | @ 06:00:00 | Stop: 2021-08-11

## 2021-08-11 MED ADMIN — sodium chloride (NS) 0.9 % flush 10 mL: 10 mL | INTRAVENOUS | @ 06:00:00 | Stop: 2021-08-11

## 2021-08-11 MED ADMIN — magnesium sulfate 2gm/50mL IVPB: 2 g | INTRAVENOUS | @ 06:00:00 | Stop: 2021-08-11

## 2021-08-11 MED ADMIN — oxyCODONE (ROXICODONE) immediate release tablet 5 mg: 5 mg | ORAL | @ 07:00:00 | Stop: 2021-08-11

## 2021-08-11 MED ADMIN — apixaban (ELIQUIS) tablet 5 mg: 5 mg | ORAL | @ 12:00:00 | Stop: 2021-08-11

## 2021-08-11 MED ADMIN — hydroCHLOROthiazide (HYDRODIURIL) tablet 12.5 mg: 12.5 mg | ORAL | @ 12:00:00 | Stop: 2021-08-11

## 2021-08-11 MED ADMIN — magnesium sulfate 2gm/50mL IVPB: 2 g | INTRAVENOUS | @ 14:00:00 | Stop: 2021-08-11

## 2021-08-11 MED ADMIN — oxyCODONE (ROXICODONE) immediate release tablet 5 mg: 5 mg | ORAL | @ 21:00:00 | Stop: 2021-08-11

## 2021-08-11 MED ADMIN — MORPhine injection 4 mg: 4 mg | INTRAVENOUS | @ 03:00:00 | Stop: 2021-08-10

## 2021-08-11 MED ADMIN — ferrous sulfate tablet 325 mg: 325 mg | ORAL | @ 12:00:00 | Stop: 2021-08-11

## 2021-08-11 MED ADMIN — carvediloL (COREG) tablet 6.25 mg: 6.25 mg | ORAL | @ 12:00:00 | Stop: 2021-08-11

## 2021-08-11 MED FILL — FEROSUL 325 MG (65 MG IRON) TABLET: ORAL | 30 days supply | Qty: 15 | Fill #0
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
## 2021-08-11 NOTE — Unmapped (Signed)
Two attempts made by WOCN to see patient. Patient not in room. Called primary nurse on Vocera and not logged on. No answer at nursing station.    Call for questions or concerns    Gwendolyn Grant, BSN, RN, [facility]  Page via my [facility] Directory  Available by [employer] or [phone]

## 2021-08-11 NOTE — Unmapped (Signed)
Pt admitted and oriented to unit this shift.  VSS and afebrile.  Denies n/v/d.  5mg  oxy given 1x for pain with reported relief.    Problem: Adult Inpatient Plan of Care  Goal: Plan of Care Review  Outcome: Progressing  Goal: Patient-Specific Goal (Individualized)  Outcome: Progressing  Goal: Absence of Hospital-Acquired Illness or Injury  Outcome: Progressing  Intervention: Identify and Manage Fall Risk  Recent Flowsheet Documentation  Taken 08/11/2021 0415 by Franne Forts, RN  Safety Interventions:   fall reduction program maintained   lighting adjusted for tasks/safety   low bed   nonskid shoes/slippers when out of bed  Goal: Optimal Comfort and Wellbeing  Outcome: Progressing  Goal: Readiness for Transition of Care  Outcome: Progressing  Goal: Rounds/Family Conference  Outcome: Progressing
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
## 2021-08-11 NOTE — Unmapped (Addendum)
Adam Hoover is a 76 y.o. male with PMHx of metastatic prostate cancer, chronic venous stasis, CKD and HTN who presented to [facility] with subacute BLE swelling.     Bilateral lower extremity edema - Chronic venous stasis dermatitis: Presents with progressively worsening lower extremity edema which has been ongoing for months (at least since 01/2021). Patient does have a known history of venous stasis dermatitis that has been managed conservatively outpatient. He initially came to the ED for concern that darolutamide caused cellulitis, though patient has been on this medication since 12/2020. Very low concern this is a drug rash given this is not a new medication. Can consider superimposed infection, however suspect the lesions along his LLE are due to skin breakdown from chronic edema. Also patient denies fever/chills and has no leukocytosis. BNP wnl and TTE negative for new dx of HF. PVLs negative for DVT.  - PT/OT, and wound care home health     Metastatic prostate cancer: Diagnosed 09/2020 after noted back pain and found to have diffuse sclerotic lesions and retroperitoneal/pelvic adenopathy. Follows with Dr. Philomena Course and palliative care. Currently on androgen deprivation therapy (ADT) plus darolutamide. Unlikely tx is contributing to leg swelling.     Hypomagnesemia: Mg 1.3 on admission. May be 2/2 thiazide.   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 Suspect a large portion is from chronic disease. Found to have IDA and increased dose of PO ferrous sulfate.   CKD: Baseline Cr ~1.6.  

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
## 2021-08-11 NOTE — Unmapped (Signed)
Physician Discharge Summary [facility]  [facility]MTU [facility]  [address]  [place] [address]  Dept: [phone]  Loc: [phone]     Identifying Information:   Adam Hoover  11-30-1945  [id number]    Primary Care Physician: Mills Koller, MD     Code Status: Full Code    Admit Date: 08/10/2021    Discharge Date: 08/11/2021     Discharge To: Home with Home Health and/or PT/OT    Discharge Service: [facility] - Oncology Floor Team (MEDO)     Discharge Attending Physician: [place] Victor Pecot, MD    Discharge Diagnoses:   Principal Problem:    Venous stasis dermatitis of both lower extremities POA: Yes  Active Problems:    Hypertension POA: Yes    Hypomagnesemia POA: Unknown    Lower extremity edema POA: Yes    Low back pain POA: Yes    Chronic kidney disease (CKD) POA: Yes    Malignant neoplasm of prostate (CMS-HCC) POA: Yes    Cancer related pain POA: Yes    Bone metastasis (CMS-HCC) POA: Yes    Normocytic anemia POA: Unknown    Iron deficiency anemia POA: Unknown  Resolved Problems:    * No resolved hospital problems. *      Hospital Course:   Adam Hoover is a 76 y.o. male with PMHx of metastatic prostate cancer, chronic venous stasis, CKD and HTN who presented to [facility] with subacute BLE swelling.     Bilateral lower extremity edema - Chronic venous stasis dermatitis: Presents with progressively worsening lower extremity edema which has been ongoing for months (at least since 01/2021). Patient does have a known history of venous stasis dermatitis that has been managed conservatively outpatient. He initially came to the ED for concern that darolutamide caused cellulitis, though patient has been on this medication since 12/2020. Very low concern this is a drug rash given this is not a new medication. Can consider superimposed infection, however suspect the lesions along his LLE are due to skin breakdown from chronic edema. Also patient denies fever/chills and has no leukocytosis. BNP wnl and TTE negative for new dx of HF. PVLs negative for DVT.  - PT/OT, and wound care home health     Metastatic prostate cancer: Diagnosed 09/2020 after noted back pain and found to have diffuse sclerotic lesions and retroperitoneal/pelvic adenopathy. Follows with Dr. Philomena Course and palliative care. Currently on androgen deprivation therapy (ADT) plus darolutamide. Unlikely tx is contributing to leg swelling.     Hypomagnesemia: Mg 1.3 on admission. May be 2/2 thiazide.   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 Suspect a large portion is from chronic disease. Found to have IDA and increased dose of PO ferrous sulfate.   CKD: Baseline Cr ~1.6.  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     The patient's hospital stay has been complicated by the following clinically significant conditions requiring additional evaluation and treatment or having a significant effect of this patient's care: - Anemia POA requiring further investigation or monitoring  - Age related debility POA requiring additional resources: DME, PT, or OT   Patient does not meet AND/ASPEN criteria for malnutrition at this time (08/11/21 2725)       Outpatient Provider Follow Up Issues:   [ ]  Consider ambulatory podiatry  consult for toe nail care  [ ]  recheck Mg and consider supplementation    Touchbase with Outpatient Provider:  Warm Handoff: Completed on 08/11/21 by Shea Stakes  (Intern) via Other DC summary routing    Procedures:  None  ______________________________________________________________________  Discharge Medications:      Your Medication List      CHANGE how you take these medications    ferrous sulfate 325 (65 FE) MG tablet  Take 1 tablet (325 mg total) by mouth every other day.  Start taking on: August 13, 2021  What changed:   ?? medication strength  ?? how much to take  ?? when to take this CONTINUE taking these medications    carvediloL 3.125 MG tablet  Commonly known as: COREG  Take 2 tablets (6.25 mg total) by mouth Two (2) times a day.     diclofenac sodium 1 % gel  Commonly known as: VOLTAREN  Apply 2 g topically two (2) times a day as needed for arthritis.     ELIQUIS 5 mg Tab  Generic drug: apixaban  Take 1 tablet by mouth two (2) times a day.     GERI-KOT 8.6 mg tablet  Generic drug: senna  Take 1 tablet by mouth Two (2) times a day.     HEALTHYLAX 17 gram packet  Generic drug: polyethylene glycol  Mix 1 packet (17 g) in 4 to 8 ounces of lliquid (tea, juice, or water) and drink by mouth Two (2) times a day.     hydroCHLOROthiazide 12.5 MG tablet  Commonly known as: HYDRODIURIL  Take 1 tablet (12.5 mg total) by mouth daily.     naloxone 4 mg/actuation nasal spray  Commonly known as: NARCAN  One spray in either nostril once for known/suspected opioid overdose. May repeat every 2-3 minutes in alternating nostril til EMS arrives     NUBEQA 300 mg tablet  Generic drug: darolutamide  Take 2 tablets (600 mg total) by mouth in the morning and 2 tablets (600 mg total) in the evening. Take with meals. Take with food. Swallow tablets whole..     triamcinolone 0.1 % ointment  Commonly known as: KENALOG  Apply topically Two (2) times a day.            Allergies:  Acetaminophen, Penicillins, and Lisinopril  ______________________________________________________________________  Pending Test Results:  Pending Labs     Order Current Status    Blood Culture #1 In process    Blood Culture #2 In process          Most Recent Labs:  All lab results last 24 hours -   Recent Results (from the past 24 hour(s))   Hepatic Function Panel    Collection Time: 08/11/21  6:19 AM   Result Value Ref Range    Albumin 3.2 (L) 3.4 - 5.0 g/dL    Total Protein 7.2 5.7 - 8.2 g/dL    Total Bilirubin 0.4 0.3 - 1.2 mg/dL    Bilirubin, Direct 1.30 0.00 - 0.30 mg/dL    AST 16 <=86 U/L    ALT 9 (L) 10 - 49 U/L    Alkaline Phosphatase 94 46 - 116 U/L   Iron Panel    Collection Time: 08/11/21  6:19 AM   Result Value Ref Range    Iron 41 (L) 65 - 175 ug/dL    TIBC 578 469 - 629 ug/dL    Iron Saturation (%) 14 (L) 20 - 55 %   Magnesium Level  Collection Time: 08/11/21  6:19 AM   Result Value Ref Range    Magnesium 1.6 1.6 - 2.6 mg/dL   Phosphorus Level    Collection Time: 08/11/21  6:19 AM   Result Value Ref Range    Phosphorus 2.7 2.4 - 5.1 mg/dL   Type and Screen on admission    Collection Time: 08/11/21  6:19 AM   Result Value Ref Range    ABO Grouping A POS     Antibody Screen NEG        Relevant Studies/Radiology:  XR Chest 2 views    Result Date: 08/10/2021  EXAM: XR CHEST 2 VIEWS DATE: 08/10/2021 1:51 PM ACCESSION: [id number] UN DICTATED: 08/10/2021 2:10 PM INTERPRETATION LOCATION: [facility] CLINICAL INDICATION: 76 years old Male with SHORTNESS OF BREATH  TECHNIQUE: PA and Lateral Chest Radiographs. COMPARISON: PET/CT 10/01/2020, chest radiograph 09/30/2020 FINDINGS: Evaluation is limited secondary to dilated and gas-filled loops of large bowel within the upper abdomen which overlie the lower chest. The lungs are hypoinflated secondary to large volume gas within the dilated loops of large bowel in the upper abdomen. No definite focal consolidation is identified. No definite pleural effusion or pneumothorax. Visualized portions of the cardiac mediastinal silhouette are unremarkable. Multilevel degenerative changes of the spine. Scattered sclerotic osseous metastases.     Limited exam as above. --Markedly dilated and gas-filled colon is seen within the upper abdomen which exhibits mass effect on the diaphragm and possibly contributes to hypoventilatory exam. These findings have been present on prior exams and are likely chronic in nature. Recommend clinical correlation. --The visualized lungs are clear. --Scattered sclerotic osseous metastases are again seen.    CT Abdomen Pelvis with IV Contrast ONLY    Result Date: 08/10/2021  EXAM: CT ABDOMEN PELVIS W CONTRAST DATE: 08/10/2021 7:14 PM ACCESSION: [id number] UN DICTATED: 08/10/2021 7:22 PM INTERPRETATION LOCATION: [facility] CLINICAL INDICATION: 76 years old with abdominal distension  COMPARISON: 10/01/2020 PET/CT and CT abdomen pelvis. TECHNIQUE: A helical CT scan of the abdomen and pelvis was obtained following IV contrast from the lung bases through the pubic symphysis. Images were reconstructed in the axial plane. Coronal and sagittal reformatted images were also provided for further evaluation. FINDINGS: LOWER CHEST: Bibasilar subsegmental atelectasis. No pleural effusions. LIVER: Redemonstration of multiple hepatic cysts measuring up to 1.2 cm. Scattered subcentimeter hepatic low-attenuation lesions, too small to characterize by CT. Focal area of hyperenhancement noted within segment 8 of the liver measuring up to 1.2 cm (2:25). Normal liver contour.  No focal liver lesions. BILIARY: No biliary ductal dilatation. Layering high attenuation in the gallbladder. SPLEEN: Normal in size and contour. PANCREAS: Normal pancreatic contour.  No focal lesions.  No ductal dilation. ADRENAL GLANDS: Similar diffuse nodular thickening of the left adrenal gland. Normal appearance of the adrenal glands. KIDNEYS/URETERS: Symmetric renal enhancement.  No hydronephrosis.  No solid renal mass. Similar bilateral renal cysts measuring up to 4.8 cm on the left (2:62) and 2.5 cm on the right (2:70). Unchanged complex appearing right inferior pole renal cyst measuring up to 2.9 cm (2:77). BLADDER: Decompressed limiting its evaluation. Grossly unremarkable. REPRODUCTIVE ORGANS: Interval decrease in size of prostatic megaly. GI TRACT: Diffusely dilated colon from the level of the cecum to the proximal sigmoid colon. There is gradual tapering of the dilation without evidence of volvulus or obstructing lesion. Dilation is similar to prior imaging from 10/01/2020. No peribowel stranding or pneumatosis. The loops of small bowel are normal in caliber. Appendix not visualized. PERITONEUM, RETROPERITONEUM AND MESENTERY:  No free air.  Similar trace fluid noted in the region of the hepatic dome adjacent to the hepatic flexure (2:8). LYMPH NODES: Similar prominent iliac chain lymph nodes measuring up to 1.2 cm (2:126). VESSELS: Hepatic and portal veins are patent.  Mild scattered calcified atherosclerotic disease. BONES and SOFT TISSUES: Slight interval increase in size of multifocal sclerotic osseous metastatic lesions. For example: -4:63 interval increase in size of posterior L2 vertebral body sclerotic lesion. -(4:70) interval increase in anterior L5 vertebral body sclerotic lesion. New lucency noted at the superior L5 vertebral body endplate (4:69).. No focal soft tissue lesions. Bilateral fat-containing inguinal hernias.     -Redemonstration of a diffusely dilated colon from the level of the cecum to the proximal sigmoid colon. No evidence of obstructing lesion. Dilation is similar to 09/28/2020 CT abdomen pelvis. Recommend clinical correlation. Further evaluation with nonemergent colonoscopy can be obtained as clinically indicated. -Similar prominent bilateral iliac chain nodes measuring up to 1.2 cm. -Slight interval increase in size of multifocal sclerotic osseous metastatic lesions. -New lucency noted at the L5 vertebral body superior endplate possibly representing compression deformity versus Schmorl's node versus lytic degeneration of metastatic disease. Recommend follow-up imaging and clinical correlation.    ED POCUS Chest Bilateral    Result Date: 08/10/2021  Limited Cardiac Ultrasound (CPT [phone]) Indication: A focused ultrasound exam of the heart was performed to evaluate for pericardial effusion, tamponade, severe hypovolemia, or gross abnormalities of cardiac anatomy or function in this patient. The ultrasound was performed with the following indications, as noted in the H&P: Dyspnea and Other indications as noted in the H&P Identified structures: The pericardial sac, myocardium, and 4 chambers were identified using the following views: parasternal long axis, parasternal short axis, and apical 4-chamber Findings: Exam of the above structures revealed the following findings:  Pericardial effusion: Absent   Pericardial tamponade: N/A  Global LV function: Reduced  Right ventricular size: Dilated   Signs of RV strain: RV hypokinesis  IVC: N/A unable to visualize  Other findings: Limitations: Body habitus and large volume ascites limit cardiac [place]. Impression: No sonographic evidence of significant pericardial effusion, No sonographic evidence of volume depletion, and Global ventricular function:  Reduced RV dilation with some evidence of global dilation/hypokinesis. Dilated LA, LV, and RV. Unable to assess IVC due to ascites. Limited Thoracic Ultrasound Indication: A focused ultrasound of the pleural spaces was performed to evaluate for pneumothorax or pulmonary edema. The ultrasound was performed with the following indications, as noted in the H&P: Dyspnea and Other indications as noted in the H&P Identified structures: The thoracic cavities and diaphragm were examined. Findings: Exam of the above structures revealed the following findings in the pleural spaces:  Evaluation of pneumothorax: Absent     B-lines: Absent      Pleural effusion: Present:   Right Impression: Pleural effusion Interpreted by: Roselie Skinner, MD Abdominal Pain Bundle (CPT: Renal [45409-81], RUQ [19147-82], Aorta [95621-30]) Limited Biliary Ultrasound (CPT: 86578-46) Indication: A focused ultrasound exam of the gallbladder and rightupper quadrant was performed to evaluate for gallstones, cholecystitis, and cholestasis in this patient. The ultrasound was performed with the following indications, as noted in the H&P: other indications as noted in the H&P protuberant abdomen, fluid overload Identified structures: The gallbladder, gallbladder wall, common bile duct, liver, and porta hepatis were examined. Findings: Exam of the above structures revealed the following findings: Gallstones: Indeterminate Sludge: Indeterminate Sonographic Murphy: Absent Pericholecystic fluid: Indeterminant Intrahepatic cholestasis: Indeterminant Other findings: large volume abdominal ascites. Limitations: large  volume ascites, protuberant abdomen limits ability of abdominal and cardiac ultrasound. Impression: Large volume ascites Interpreted by: Roselie Skinner, MD    Echocardiogram W Colorflow Spectral Doppler    Result Date: 08/11/2021  Patient Info Name:     Adam Hoover Age:     51 years DOB:     Mar 08, 1946 Gender:     Male MRN:     [id number] Accession #:     [id number] UN Ht:     180 cm Wt:     110 kg BSA:     2.39 m2 BP:     122 /     83 mmHg Technical Quality:     Fair Exam Date:     08/11/2021 10:46 AM Site Location:     [facility] Exam Location:     [facility] Admit Date:     08/10/2021 Exam Type:     ECHOCARDIOGRAM W COLORFLOW SPECTRAL DOPPLER Study Info Indications      - worsening lower extremity edema Complete two-dimensional, color flow and Doppler transthoracic echocardiogram is performed. Staff Referring Physician:     [phone], FARMER; Reading Fellow:     Massie Maroon MD Sonographer:     Bryson Dames Ordering Physician:     Forest Becker Account #:     [id number] Summary   1. The left ventricle is normal in size with normal wall thickness.   2. The left ventricular systolic function is normal, LVEF is visually estimated at 60-65%.   3. The right ventricle is normal in size, with normal systolic function. Left Ventricle   The left ventricle is normal in size with normal wall thickness.   The left ventricular systolic function is normal, LVEF is visually estimated at 60-65%.   There is normal left ventricular diastolic function. Right Ventricle   The right ventricle is normal in size, with normal systolic function. Left Atrium   The left atrium is normal in size. Right Atrium   The right atrium is normal  in size. Aortic Valve   The aortic valve is probably trileaflet with normal appearing leaflets with normal excursion.   There is no significant aortic regurgitation.   There is no evidence of a significant transvalvular gradient. Pulmonic Valve   The pulmonic valve is poorly visualized, but probably normal.   There is no significant pulmonic regurgitation.   There is no evidence of a significant transvalvular gradient. Mitral Valve   The mitral valve leaflets are normal with normal leaflet mobility.   There is trivial mitral valve regurgitation. Tricuspid Valve   The tricuspid valve leaflets are normal, with normal leaflet mobility.   There is no significant tricuspid regurgitation.   The pulmonary systolic pressure cannot be estimated due to insufficient TR signal. Other Findings   Rhythm: Sinus Rhythm. Pericardium/Pleural   There is no pericardial effusion. Inferior Vena Cava   The IVC is not well visualized precluding the ability to accurate assess right atrial pressure. Aorta   The aorta is not well visualized. Pulmonic Valve ---------------------------------------------------------------------- Name                                 Value        Normal ---------------------------------------------------------------------- PV Doppler ---------------------------------------------------------------------- PV Peak Velocity                   0.7 m/s Mitral Valve ---------------------------------------------------------------------- Name  Value        Normal ---------------------------------------------------------------------- MV Diastolic Function ---------------------------------------------------------------------- MV E Peak Velocity                 51 cm/s               MV A Peak Velocity                 68 cm/s               MV E/A                                 0.8               MV Annular TDI ---------------------------------------------------------------------- MV Septal e' Velocity             7.6 cm/s         >=8.0 MV Lateral e' Velocity           13.5 cm/s        >=10.0 MV e' Average                         10.6               MV E/e' (Average)                      5.2 Aorta ---------------------------------------------------------------------- Name                                 Value        Normal ---------------------------------------------------------------------- Ascending Aorta ---------------------------------------------------------------------- Ao Root Diameter (2D)               3.4 cm               Ao Root Diam Index (2D)         14.3 cm/m2 Aortic Valve ---------------------------------------------------------------------- Name                                 Value        Normal ---------------------------------------------------------------------- AV Doppler ---------------------------------------------------------------------- AV Peak Velocity                   1.1 m/s Ventricles ---------------------------------------------------------------------- Name                                 Value        Normal ---------------------------------------------------------------------- LV Dimensions 2D/MM ---------------------------------------------------------------------- IVS Diastolic Thickness (2D)        1.1 cm       0.6-1.0 LVID Diastole (2D)                  5.0 cm       4.2-5.8 LVPW Diastolic Thickness (2D)                                1.1 cm       0.6-1.0 LVID Systole (2D)                   3.5 cm       2.5-4.0 RV Dimensions  2D/MM ---------------------------------------------------------------------- TAPSE                               2.6 cm         >=1.7 Atria ---------------------------------------------------------------------- Name                                 Value        Normal ---------------------------------------------------------------------- LA Dimensions ---------------------------------------------------------------------- LA Dimension (2D)                   4.1 cm       3.0-4.1 LA Volume Index (4C A-L)       28.79 ml/m2               LA Volume Index (2C A-L)       20.95 ml/m2               LA Volume (BP MOD)                   59 ml               LA Volume Index (BP MOD)       24.56 ml/m2   16.00-34.00 RA Dimensions ---------------------------------------------------------------------- RA Area (4C)                      16.1 cm2        <=18.0 RA Area (4C) Index              6.7 cm2/m2 Report Signatures Resident Massie Maroon  MD on 08/11/2021 01:13 PM    PVL Venous Duplex Lower Extremity Bilateral    Result Date: 08/10/2021   Peripheral Vascular Lab     [address]   [place], [place] [address]  PVL VENOUS DUPLEX LOWER EXTREMITY BILATERAL Patient Demographics Pt. Name: Adam Hoover Location: Emergency Department MRN:      [id number]            Sex:      Judie Petit DOB:      09-Dec-1945         Age:      60 years  Study Information Authorizing         [id number] Suszanne Conners      Performed Time       08/10/2021 Provider Name       [facility]                                     11:40:40 AM Ordering Physician  Suszanne Conners [facility]      Patient Location     [facility] Accession Number    [id number] [facility]         Technologist         Glennon Hamilton                                                                RVT Diagnosis:  Assisting                                           Technologist Ordered Reason For Exam: c/f LE DVT Indication: Pain Edema Risk Factors: Cancer ( metastatic prostate cancer ) and DVT (Hx of PE/DVT 2017). Other Factors: (cellulitis). Anticoagulation: (Eliquis). Protocol The major deep veins from the inguinal ligament to the ankle are assessed for bilaterally for compressibility and color and spectral Doppler flow characteristics. The assessed veins include bilateral common femoral vein, femoral vein in the thigh, popliteal vein, and intramuscular calf veins. The iliac vein is assessed indirectly using Doppler waveform analysis. The great saphenous vein is assessed for compressibility at the saphenofemoral junction, and the small saphenous vein assessed for compressibility behind the knee. Limitations: Body habitus, poor ultrasound/tissue interface and patient discomfort with compressions.  Right Duplex Findings All veins visualized appear fully compressible. Doppler flow signals demonstrate normal spontaneity, phasicity, and augmentation. Not visualized segments included the distal FV, proximal PTVs and peroneal veins in their entirety due to limitations noted above.  Left Duplex Findings All veins visualized appear fully compressible. Doppler flow signals demonstrate normal spontaneity, phasicity, and augmentation. Not visualized segments included the mid-distal FV, proximal PTVs and peroneal veins in their entirety due to limitations noted above.  Right Technical Summary No evidence of deep venous obstruction in the lower extremity. No indirect evidence of obstruction proximal to the inguinal ligament. Not visualized segments included the distal FV, proximal PTVs and peroneal veins in their entirety due to limitations noted above.  Left Technical Summary No evidence of deep venous obstruction in the lower extremity. No indirect evidence of obstruction proximal to the inguinal ligament. Not visualized segments included the mid-distal FV, proximal PTVs and peroneal veins in their entirety due to limitations noted above.  Final Interpretation Right There is no evidence of DVT in the lower extremity. However, portions of this examination were limited- see technologist comments above. There is no evidence of obstruction proximal to the inguinal ligament or in the common femoral vein. Left There is no evidence of DVT in the lower extremity. However, portions of this examination were limited- see technologist comments above. There is no evidence of obstruction proximal to the inguinal ligament or in the common femoral vein.  Electronically signed by [id number] Jodell Cipro MD on 08/10/2021 at 2:03:15 PM.   Final     XR Abdomen Portable    Result Date: 08/10/2021  EXAM: XR ABDOMEN PORTABLE DATE: 08/10/2021 4:20 PM ACCESSION: [id number] UN DICTATED: 08/10/2021 4:21 PM INTERPRETATION LOCATION: Main Campus CLINICAL INDICATION: 76 years old Male with ABDOMINAL PAIN  -  UNSPECIFIED SITE  COMPARISON: PET/CT  10/01/20 TECHNIQUE: Supine views of the abdomen (4 total views). FINDINGS: Multiple dilated air-filled loops of large bowel project over the abdomen, with similar findings seen PET/CT 10/01/2020. No definitive dilated loops of small bowel are visualized however evaluation is limited by diffuse large bowel dilation as above. Multilevel degenerative changes of the spine with flowing right-sided osteophytes similar to prior. Multifocal ill-defined osseous sclerotic lesions.     Multiple dilated air-filled loops of large bowel, which was seen on previous PET CT 10/01/2020. Findings are nonspecific given similarity to prior. If there is clinical concern for colonic obstruction, consider further evaluation with CT abdomen pelvis. Multifocal ill-defined osseous sclerosis/sclerotic lesions in keeping with patient's history of metastatic prostate carcinoma.    ED POCUS  Extremity Nonvasc LTD (Effusion, Mass, Fluid) Left    Result Date: 08/10/2021  Limited Soft Tissue Ultrasound Indication: A focused ultrasound of soft tissue was performed to evaluate for cellulitis, abscess, or foreign body. The ultrasound was performed with the following indications, as noted in the H&P: Soft tissue pain, Soft tissue swelling, and Other indications as noted in the H&P Identified structures:  Precise location of the soft tissue evaluation: LLE/RLE lower legs Findings: Exam of the above structures revealed the following findings:  Abscess: none  Cellulitis: none  Other: chronic skin changes, adipose deposition associated with venous stasis. No acute signs of infectious cellulitis in either leg. Impression:   Other: chronic skin changes, adipose deposition associated with venous stasis. No acute signs of infectious cellulitis in either leg. No acute cobblestoning Interpreted by: Roselie Skinner, MD    ______________________________________________________________________  Discharge Instructions:   Activity Instructions     Activity as tolerated                     Follow Up instructions and Outpatient Referrals     Call MD for:  difficulty breathing, headache or visual disturbances      Call MD for:  persistent nausea or vomiting      Call MD for:  severe uncontrolled pain      Call MD for:  temperature >38.5 Celsius      Discharge instructions          Appointments which have been scheduled for you    Aug 27, 2021 11:00 AM  (Arrive by 10:45 AM)  RETURN CONTINUITY with Ocie Doyne, MD  [facility] INTERNAL MEDICINE [place] [facility] REGION) [address]  [place] [address]  [phone]      Nov 08, 2021  9:00 AM  (Arrive by 8:30 AM)  LAB ONLY [place] with ADULT ONC LAB  [facility] ADULT ONCOLOGY LAB [place] [facility] REGION) [address]  [place] [address]  [phone]      Nov 08, 2021 10:00 AM  (Arrive by 9:30 AM)  RETURN ACTIVE  with Chancy Hurter, ANP  [place] ONCOLOGY MULTIDISCIPLINARY 2ND FLR CANCER HOSP [facility] REGION) [address]  [place] [address]  [phone]      Nov 08, 2021 10:30 AM  (Arrive by 10:00 AM)  Danelle Earthly NURSE with [facility] INJECTION MULTI  [place] ONCOLOGY MULTIDISCIPLINARY 2ND FLR CANCER HOSP [facility] REGION) [address]  [place] [address]  [phone]      Nov 08, 2021 10:30 AM  (Arrive by 10:00 AM)  RETURN PALLIATIVE CARE with Burnis Kingfisher, FNP  [place] ONCOLOGY MULTIDISCIPLINARY 2ND FLR CANCER HOSP [facility] REGION) [address]  [place] [address]  [phone]      Nov 16, 2021 10:00 AM  (Arrive by 9:45 AM)  RETURN ANTICOAG with Julieanne Cotton, PA  [facility] INTERNAL MEDICINE [place]) [address]  [place] [address]  [phone]           ______________________________________________________________________  Discharge Day Services:  BP 138/77  - Pulse 84  - Temp 36.7 ??C (98 ??F) (Oral)  - Resp 18  - Ht 180.3 cm (5' 11)  - Wt (!) 110.3 kg (243 lb 2.7 oz)  - SpO2 100%  - BMI 33.91 kg/m??     Pt seen on the day of discharge  and determined appropriate for discharge.    Condition at Discharge: good    Length of Discharge: I spent greater than 30 mins in the discharge of this patient.

## 2021-08-12 DIAGNOSIS — G893 Neoplasm related pain (acute) (chronic): Principal | ICD-10-CM

## 2021-08-12 MED ORDER — OXYCODONE 5 MG TABLET
ORAL_TABLET | Freq: Three times a day (TID) | ORAL | 0 refills | 20 days | Status: CP | PRN
Start: 2021-08-12 — End: ?
  Filled 2021-08-13: qty 60, 20d supply, fill #0

## 2021-08-12 NOTE — Unmapped (Signed)
VSS, afebrile. Pt tolerating regular diet. PRN oxycodone given for leg pain. DC instruction given. [facility] contacted for pending DC. No assessment made. Team aware. Dc instruction made with daughter present in room. Medication delivered in room by [place] [facility]. DC home.     Problem: Adult Inpatient Plan of Care  Goal: Plan of Care Review  Outcome: Resolved  Goal: Patient-Specific Goal (Individualized)  Outcome: Resolved  Goal: Absence of Hospital-Acquired Illness or Injury  Outcome: Resolved  Goal: Optimal Comfort and Wellbeing  Outcome: Resolved  Goal: Readiness for Transition of Care  Outcome: Resolved  Goal: Rounds/Family Conference  Outcome: Resolved

## 2021-08-13 MED ORDER — FERROUS SULFATE 325 MG (65 MG IRON) TABLET
ORAL_TABLET | ORAL | 11 refills | 30 days | Status: CP
Start: 2021-08-13 — End: 2022-08-13
  Filled 2021-09-17: qty 15, 30d supply, fill #1

## 2021-08-17 NOTE — Unmapped (Signed)
Other Call    ??? Caller name: Arline Asp from [facility]   Best callback number: [phone]     ??? Describe the reason for the call: will admit for home health for wound care. Will fax orders.  ??? PCP: Mills Koller, MD  ??? Last encounter in department: 07/09/2021    Saddie Benders  [phone]

## 2021-08-20 MED FILL — NUBEQA 300 MG TABLET: ORAL | 30 days supply | Qty: 120 | Fill #7

## 2021-08-20 MED FILL — HYDROCHLOROTHIAZIDE 12.5 MG TABLET: ORAL | 30 days supply | Qty: 30 | Fill #4

## 2021-08-20 NOTE — Unmapped (Signed)
[facility] Specialty Pharmacy Refill Coordination Note    Specialty Medication(s) to be Shipped:   Hematology/Oncology: Nubeqa    Other medication(s) to be shipped: No additional medications requested for fill at this time     Adam Hoover, DOB: 29-Jun-1945  Phone: [phone] (home)       All above HIPAA information was verified with patient's family member, Daughter.     Was a Nurse, learning disability used for this call? No    Completed refill call assessment today to schedule patient's medication shipment from the [facility] [phone]).  All relevant notes have been reviewed.     Specialty medication(s) and dose(s) confirmed: Regimen is correct and unchanged.   Changes to medications: Armonte reports no changes at this time.  Changes to insurance: No  New side effects reported not previously addressed with a pharmacist or physician: None reported  Questions for the pharmacist: No    Confirmed patient received a Conservation officer, historic buildings and a Surveyor, mining with first shipment. The patient will receive a drug information handout for each medication shipped and additional FDA Medication Guides as required.       DISEASE/MEDICATION-SPECIFIC INFORMATION        N/A    SPECIALTY MEDICATION ADHERENCE     Medication Adherence    Patient reported X missed doses in the last month: 0  Specialty Medication: Nubeqa 300 mg  Patient is on additional specialty medications: No  Informant: child/children              Were doses missed due to medication being on hold? No    Nubeqa 300 mg: 5 days of medicine on hand        REFERRAL TO PHARMACIST     Referral to the pharmacist: Not needed      [facility]     Shipping address confirmed in Epic.     Delivery Scheduled: Yes, Expected medication delivery date: 08/21/21.     Medication will be delivered via Next Day Courier to the prescription address in Epic [place].    [name]   [facility] Specialty Technician

## 2021-08-27 ENCOUNTER — Ambulatory Visit
Admit: 2021-08-27 | Discharge: 2021-08-28 | Payer: MEDICARE | Attending: Student in an Organized Health Care Education/Training Program | Primary: Student in an Organized Health Care Education/Training Program

## 2021-08-27 DIAGNOSIS — C7951 Secondary malignant neoplasm of bone: Principal | ICD-10-CM

## 2021-08-27 DIAGNOSIS — Z09 Encounter for follow-up examination after completed treatment for conditions other than malignant neoplasm: Principal | ICD-10-CM

## 2021-08-27 DIAGNOSIS — G893 Neoplasm related pain (acute) (chronic): Principal | ICD-10-CM

## 2021-08-27 DIAGNOSIS — N1831 Stage 3a chronic kidney disease (CMS-HCC): Principal | ICD-10-CM

## 2021-08-27 DIAGNOSIS — C61 Malignant neoplasm of prostate: Principal | ICD-10-CM

## 2021-08-27 DIAGNOSIS — R600 Localized edema: Principal | ICD-10-CM

## 2021-08-27 DIAGNOSIS — I872 Venous insufficiency (chronic) (peripheral): Principal | ICD-10-CM

## 2021-08-27 DIAGNOSIS — I10 Essential (primary) hypertension: Principal | ICD-10-CM

## 2021-08-27 MED ORDER — HYDROCHLOROTHIAZIDE 25 MG TABLET
ORAL_TABLET | Freq: Every day | ORAL | 3 refills | 90 days | Status: CP
Start: 2021-08-27 — End: 2022-08-27
  Filled 2021-09-03: qty 90, 90d supply, fill #0

## 2021-08-27 NOTE — Unmapped (Signed)
I saw and evaluated the patient, participating in the key portions of the service.  I reviewed the resident’s note.  I agree with the resident’s findings and plan. Zuma Hust R Trishna Cwik, MD

## 2021-08-27 NOTE — Unmapped (Signed)
Internal Medicine Clinic Visit    Reason for visit: Hospital Follow Up    A/P:         1. Hospital discharge follow-up    2. Venous stasis dermatitis of both lower extremities    3. Lower extremity edema    4. Stage 3a chronic kidney disease (CMS-HCC)    5. Essential hypertension    6. Malignant neoplasm of prostate (CMS-HCC)    7. Bone metastasis (CMS-HCC)    8. Cancer related pain      1. Hospital discharge follow-up - Venous stasis dermatitis of both lower extremities - Lower extremity edema  Overall he is doing well following his hospital discharge. He continues with symptoms of lower extremity edema with overlying wounds consistent with venous stasis dermatitis. He did have thorough evaluation during his hospitalization for alternative etiologies of his lower extremity edema, which was thankfully unremarkable. As he is not significantly symptomatic from this standpoint we will continue with conservative measures.   -Continue with home health wound care/nursing, appreciate assistance  -Continue with compression dressings   -Encouraged leg elevation     2. Hypertension  His blood pressure is elevated today, and this has been well-documented in the clinic, hospital, and home settings. As he continues to have lower extremity edema, we will plan to increase the dose of his hydrochlorothiazide at today's visit.   -Continue carvedilol 6.25mg  BID   -Increase hydrochlorothiazide to 25mg  daily (from 12.5mg )  -Follow up with [facility] scheduled in 2 months, recommend recheck HTN 3 at that time  -Continue to monitor     3. Stage 3a chronic kidney disease (CMS-HCC)  His baseline serum creatinine is 1.6. He has remained at this baseline during his recent hospitalization. We will continue to monitor and renally adjust medication doses as indicated.   -Continue to monitor     4. Malignant neoplasm of prostate (CMS-HCC) - Bone metastasis (CMS-HCC) - Cancer related pain  He follows with Dr. Philomena Course for his metastatic prostate cancer. He continues to respond well to androgen deprivation therapy. His darolutamide is likely driving at least part of his hypertension as this is a known adverse effect. His pain remains adequately controlled.   -Continue excellent care per Dr. Philomena Course  -Continue to monitor     Return in about 5 months (around 01/27/2022).    Staffed with Dr. Feliz Beam, seen and discussed    __________________________________________________________    HPI:    He has been doing well since his hospitalization earlier this month. He was only briefly monitored due to concern for cellulitis of the lower extremities, though further evaluation was more consistent with his chronic venous stasis dermatitis. He was discharged without antibiotics and has been doing well from this regard. He has had home health nursing/wound care since discharge and they have been assisting with dressing changes. He does still have bilateral lower extremity edema with overlying skin changes but does not have any evidence for any overlying infection at this time.     His blood pressure today is elevated. Upon review of his hospitalization records, he did have persistent elevations to this same degree (140s-150s systolic). He also reports that with home health nursing his home blood pressure has been in the 140s systolic.     He continues to do well with androgen deprivation therapy with darolutamide. His pain has not been a huge concern for him at this time. He has follow up with Dr. Philomena Course in the coming months.   __________________________________________________________  Medications:  Reviewed in EPIC  __________________________________________________________    Physical Exam:   Vital Signs:  Vitals:    08/27/21 1051   BP: 158/91   BP Site: L Arm   BP Position: Sitting   BP Cuff Size: Large   Pulse: 96   Resp: 20   Temp: 36.9 ??C (98.4 ??F)   TempSrc: Temporal   SpO2: 99%   Weight: (!) 111.5 kg (245 lb 12.8 oz)   Height: 180.3 cm (5' 11) PTHomeBP    The patient???s Average Home Blood Pressure during the last two weeks is :    /       Gen: Chronically ill-appearing elderly gentleman in NAD, hard of hearing  CV: RRR, no murmurs  Pulm: CTAB, no crackles or wheezes, normal work of breathing on room air   Abd: Soft, NTND   Ext: 1+ tense pitting edema of the bilateral lower extremities to the level of the knees, overlying scale with eschar noted bilaterally (concentrated more distally), bandages clean and intact    PHQ-9 Score:     GAD-7 Score:     Medication adherence and barriers to the treatment plan have been addressed. Opportunities to optimize healthy behaviors have been discussed. Patient / caregiver voiced understanding.    Ocie Doyne, MD  [facility] Internal Medicine - PGY-2  Pager: [phone]

## 2021-08-27 NOTE — Unmapped (Signed)
AOBP:    Left     arm           Large    cuff     Average :   158/91             Pulse: 76    1st reading:  157/90            Pulse: 75    2nd reading:  158/91          Pulse:  76    3rd reading:             Pulse:       BP is high. Patient denies any symptoms. MD made aware

## 2021-08-27 NOTE — Unmapped (Signed)
Thank you for visiting the clinic today! Here is what we discussed:    We will increase your hydrochlorothiazide dose to 25mg  daily today. I have sent in a new prescription for you.   You should continue with the home health wound care, and you can ask the nurse to apply compression wraps to the knee to help with your swelling. You can also elevate your legs to help with the swelling as well.

## 2021-09-08 MED FILL — CARVEDILOL 3.125 MG TABLET: ORAL | 30 days supply | Qty: 120 | Fill #2

## 2021-09-22 NOTE — Unmapped (Signed)
[facility] Specialty Pharmacy Refill Coordination Note    Specialty Medication(s) to be Shipped:   Hematology/Oncology: [name]  ((DENIED))    Other medication(s) to be shipped: No additional medications requested for fill at this time     Eloy Fehl, DOB: 06-09-1945  Phone: [phone] (home)       All above HIPAA information was verified with patient's family member, Daughter.     Was a Nurse, learning disability used for this call? No    Completed refill call assessment today to schedule patient's medication shipment from the [facility] [phone]).  All relevant notes have been reviewed.     Specialty medication(s) and dose(s) confirmed: Regimen is correct and unchanged.   Changes to medications: Faruq reports no changes at this time.  Changes to insurance: No  New side effects reported not previously addressed with a pharmacist or physician: None reported  Questions for the pharmacist: No    Confirmed patient received a Conservation officer, historic buildings and a Surveyor, mining with first shipment. The patient will receive a drug information handout for each medication shipped and additional FDA Medication Guides as required.       DISEASE/MEDICATION-SPECIFIC INFORMATION        N/A    SPECIALTY MEDICATION ADHERENCE     Medication Adherence    Patient reported X missed doses in the last month: 0  Specialty Medication: Nubeqa 300 mg  Patient is on additional specialty medications: No  Informant: child/children              Were doses missed due to medication being on hold? No    Nubeqa 300 mg: 30+ days of medicine on hand        REFERRAL TO PHARMACIST     Referral to the pharmacist: Not needed      [facility]     Shipping address confirmed in Epic.     Delivery Scheduled: Patient declined refill at this time due to 30+ days.     Medication will be delivered via Next Day Courier to the prescription address in Epic [place].    [name]   [facility] Specialty Technician

## 2021-10-15 MED FILL — ELIQUIS 5 MG TABLET: ORAL | 30 days supply | Qty: 60 | Fill #3

## 2021-10-15 MED FILL — FEROSUL 325 MG (65 MG IRON) TABLET: ORAL | 30 days supply | Qty: 15 | Fill #2

## 2021-10-22 NOTE — Unmapped (Signed)
[facility] Specialty Pharmacy Refill Coordination Note    Specialty Medication(s) to be Shipped:   Hematology/Oncology: Nubeqa    Other medication(s) to be shipped: No additional medications requested for fill at this time     Adam Hoover, DOB: 1946/03/08  Phone: [phone] (home)       All above HIPAA information was verified with patient's family member, [place].     Was a Nurse, learning disability used for this call? No    Completed refill call assessment today to schedule patient's medication shipment from the [facility] [phone]).  All relevant notes have been reviewed.     Specialty medication(s) and dose(s) confirmed: Regimen is correct and unchanged.   Changes to medications: Avett reports no changes at this time.  Changes to insurance: No  New side effects reported not previously addressed with a pharmacist or physician: None reported  Questions for the pharmacist: No    Confirmed patient received a Conservation officer, historic buildings and a Surveyor, mining with first shipment. The patient will receive a drug information handout for each medication shipped and additional FDA Medication Guides as required.       DISEASE/MEDICATION-SPECIFIC INFORMATION        N/A    SPECIALTY MEDICATION ADHERENCE     Medication Adherence    Patient reported X missed doses in the last month: 0  Specialty Medication: Nubeqa 300 mg  Patient is on additional specialty medications: No              Were doses missed due to medication being on hold? No    nubeqa 300mg   : 14 days of medicine on hand        REFERRAL TO PHARMACIST     Referral to the pharmacist: Not needed      [facility]     Shipping address confirmed in Epic.     Delivery Scheduled: Yes, Expected medication delivery date: 6/1.     Medication will be delivered via Same Day Courier to the prescription address in Epic [place].    Westley Gambles   [facility] Specialty Technician

## 2021-10-28 MED FILL — NUBEQA 300 MG TABLET: ORAL | 30 days supply | Qty: 120 | Fill #8

## 2021-11-02 DIAGNOSIS — C61 Malignant neoplasm of prostate: Principal | ICD-10-CM

## 2021-11-08 ENCOUNTER — Ambulatory Visit
Admit: 2021-11-08 | Discharge: 2021-11-08 | Payer: MEDICARE | Attending: Nurse Practitioner | Primary: Nurse Practitioner

## 2021-11-08 ENCOUNTER — Institutional Professional Consult (permissible substitution): Admit: 2021-11-08 | Discharge: 2021-11-08 | Payer: MEDICARE

## 2021-11-08 ENCOUNTER — Other Ambulatory Visit: Admit: 2021-11-08 | Discharge: 2021-11-08 | Payer: MEDICARE

## 2021-11-08 ENCOUNTER — Ambulatory Visit: Admit: 2021-11-08 | Discharge: 2021-11-08 | Payer: MEDICARE

## 2021-11-08 DIAGNOSIS — C7951 Secondary malignant neoplasm of bone: Principal | ICD-10-CM

## 2021-11-08 DIAGNOSIS — C61 Malignant neoplasm of prostate: Principal | ICD-10-CM

## 2021-11-08 DIAGNOSIS — G8929 Other chronic pain: Principal | ICD-10-CM

## 2021-11-08 DIAGNOSIS — G893 Neoplasm related pain (acute) (chronic): Principal | ICD-10-CM

## 2021-11-08 DIAGNOSIS — M25562 Pain in left knee: Principal | ICD-10-CM

## 2021-11-08 DIAGNOSIS — Z515 Encounter for palliative care: Principal | ICD-10-CM

## 2021-11-08 DIAGNOSIS — M25561 Pain in right knee: Principal | ICD-10-CM

## 2021-11-08 LAB — PSA: PROSTATE SPECIFIC ANTIGEN: 10.8 ng/mL — ABNORMAL HIGH (ref 0.00–4.00)

## 2021-11-08 MED ORDER — OXYCODONE 5 MG TABLET
ORAL_TABLET | Freq: Three times a day (TID) | ORAL | 0 refills | 20 days | Status: CP | PRN
Start: 2021-11-08 — End: ?
  Filled 2021-11-08: qty 60, 20d supply, fill #0

## 2021-11-08 MED ADMIN — leuprolide acetate (6 month) (ELIGARD) injection 45 mg: 45 mg | SUBCUTANEOUS | @ 16:00:00 | Stop: 2021-11-08

## 2021-11-08 MED FILL — CARVEDILOL 3.125 MG TABLET: ORAL | 30 days supply | Qty: 120 | Fill #0
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
## 2021-11-08 NOTE — Unmapped (Signed)
BP noted 185/100. Pallative care provider in room at time and is aware.
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
## 2021-11-08 NOTE — Unmapped (Signed)
Injection today, due again in 6 months  Follow up in 3 months with Dr Philomena Course for labs and a visit  Continue cancer medication  Follow up with your primary care provider  Blood pressure high today- if headaches, vision changes, feeling poorly, please seek immediate care    [name], NP-C, OCN  Adult Nurse Practitioner  Urology and Medical Oncology    Notice: Many test results will be automatically released into MyChart. This may happen before your provider has a chance to review them. Your results will either be reviewed with you at your scheduled appointment or your provider or someone from their office will reach out to you to discuss the results. Thank you for your understanding    If you have been prescribed a medication today, always read the package insert that comes with the medication.    In the event of an emergency, always call 911    Urology:  Main clinic & [place]: [phone]  Fax: [phone]    Cancer Hospital:  Phone: [phone]  Fax: [phone]    After hours/nights/weekends:  Hospital Operator: [phone]    [email or web address]  [email or web address]

## 2021-11-08 NOTE — Unmapped (Signed)
Pt tolerated Eligard injection without difficulty. [facility] left [facility] via wheelchaior NAD, no questions, complaints, nor concerns voiced at d/c.

## 2021-11-16 ENCOUNTER — Ambulatory Visit: Admit: 2021-11-16 | Discharge: 2021-11-17 | Payer: MEDICARE

## 2021-11-16 DIAGNOSIS — Z86711 Personal history of pulmonary embolism: Principal | ICD-10-CM

## 2021-11-16 DIAGNOSIS — I10 Essential (primary) hypertension: Principal | ICD-10-CM

## 2021-11-16 NOTE — Unmapped (Signed)
Jillene Bucks, DMSc, PA-C    Assessment/Plan:     Body mass index is 34.11 kg/m??.    DOAC: Eliquis 5 mg twice daily    Dose appropriate based on indication, age, weight, renal/liver function, bleed risk, BMI, concomitant meds: yes  DOAC trough indicated: no  GI protection needed based on age, h/o GI complication, concomitant antiplatelet tx: no    1.  History of PE-current creatinine clearance at 63, continue Eliquis 5 mg twice daily for now.Given his active cancer, we will continue anticoagulation until instructed otherwise by heme-onc. Has had fairly recent CBC, creatinine, LFTs with decline in hemoglobin though relatively stable in comparison.  Plan to repeat 6 months.   2.  Hypertension-at goal, continue.    Follow up: 6 months    Subject/Objective:     Patient: Adam Hoover 76 y.o.    Medical Record Number:  [id number]    PRIMARY CARE PHYSICIAN: Mills Koller, MD    XB:[id number] VTE, htn    PMHx:   Patient Active Problem List   Diagnosis    Age-related cataract    Tracheal stenosis    History of pulmonary embolism, DVT/PE 07/17/15    Diverticulosis of large intestine    History of lower GI bleeding    Hypertension    Macrocytic anemia    Alcohol abuse    Hypomagnesemia    Bleeding internal hemorrhoids    Angioedema    Lower extremity edema    Low back pain    Chronic kidney disease (CKD)    Weight loss    Malignant neoplasm of prostate (CMS-HCC)    Cancer related pain    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 60 tablet 11    carvediloL (COREG) 3.125 MG tablet Take 2 tablets (6.25 mg total) by mouth Two (2) times a day. 120 tablet 11    darolutamide (NUBEQA) 300 mg tablet Take 2 tablets (600 mg total) by mouth in the morning and 2 tablets (600 mg total) in the evening. Take with meals. Take with food. Swallow tablets whole.. 120 tablet 11    diclofenac sodium (VOLTAREN) 1 % gel Apply 2 g topically two (2) times a day as needed for arthritis. 100 g 2    ferrous sulfate 325 (65 FE) MG tablet Take 1 tablet (325 mg total) by mouth every other day. 15 tablet 11    hydroCHLOROthiazide (HYDRODIURIL) 25 MG tablet Take 1 tablet (25 mg total) by mouth daily. 90 tablet 3    naloxone (NARCAN) 4 mg nasal spray One spray in either nostril once for known/suspected opioid overdose. May repeat every 2-3 minutes in alternating nostril til EMS arrives 2 each 0    oxyCODONE (ROXICODONE) 5 MG immediate release tablet Take 1/2 - 1 tablets (2.5-5 mg total) by mouth every eight (8) hours as needed for pain. 60 tablet 0    polyethylene glycol (MIRALAX) 17 gram packet Mix 1 packet (17 g) in 4 to 8 ounces of lliquid (tea, juice, or water) and drink by mouth Two (2) times a day. (Patient not taking: Reported on 08/27/2021) 60 packet 0    senna (SENOKOT) 8.6  mg tablet Take 1 tablet by mouth Two (2) times a day. (Patient not taking: Reported on 08/27/2021) 60 tablet 0    triamcinolone (KENALOG) 0.1 % ointment Apply topically Two (2) times a day. 80 g 0     No current facility-administered medications on file prior to visit.        General:  Well appearing, no acute distress    Signs/Symptoms bleeding or bruising: no    Signs/Symptoms of Potential Embolic Events: no       Medications reviewed including OTC and herbals:yes    The patient set a personal goal for anticoagulation management today:Take warfarin daily as recommended.    Medication adherence and barriers to the treatment plan have been addressed. Opportunities to optimize healthy behaviors have been discussed. Patient / caregiver voiced understanding.      Additional History:

## 2021-11-23 NOTE — Unmapped (Signed)
[facility] Refill Coordination Note    Specialty Medication(s) to be Shipped:   Hematology/Oncology: Nubeqa    Other medication(s) to be shipped: No additional medications requested for fill at this time     Adam Hoover, DOB: [birth year]  Phone: [phone] (home)       All above HIPAA information was verified with patient.     Was a Nurse, learning disability used for this call? No    Completed refill call assessment today to schedule patient's medication shipment from the [facility] [phone]).  All relevant notes have been reviewed.     Specialty medication(s) and dose(s) confirmed: Regimen is correct and unchanged.   Changes to medications: [place] reports no changes at this time.  Changes to insurance: No  New side effects reported not previously addressed with a pharmacist or physician: None reported  Questions for the pharmacist: No    Confirmed patient received a Conservation officer, historic buildings and a Surveyor, mining with first shipment. The patient will receive a drug information handout for each medication shipped and additional FDA Medication Guides as required.       DISEASE/MEDICATION-SPECIFIC INFORMATION        N/A    SPECIALTY MEDICATION ADHERENCE     Medication Adherence    Patient reported X missed doses in the last month: 0  Specialty Medication: Nubeqa 300mg   Patient is on additional specialty medications: No  Informant: patient  Confirmed plan for next specialty medication refill: delivery by pharmacy  Refills needed for supportive medications: not needed          Were doses missed due to medication being on hold? No    Nubeqa 300mg   : 14 days of medicine on hand        REFERRAL TO PHARMACIST     Referral to the pharmacist: Not needed      [facility]     Shipping address confirmed in Epic.     Delivery Scheduled: Yes, Expected medication delivery date: 12/04/21.     Medication will be delivered via Next Day Courier to the prescription address in Epic [place].    [name]   [facility] Specialty Technician

## 2021-11-30 NOTE — Unmapped (Signed)
Thank you for your e-consult to the [facility] Cancer Genetics team.    SUMMARY:   [name] is an 76 y.o. 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  His family history is notable for his father having lung cancer in his 25's.    This patient met NCCN criteria for evaluation of hereditary cancer risk based on his diagnosis with metastatic hormone sensative prostate cancer, thus genetic testing was recommended to guide treatment and therapy decisions. Adam Hoover test was performed at [facility] and included sequencing and deletion/duplication analysis of the following genes: BRCA1, BRCA2, ATM, BARD1, BRIP1, FANCA, CHEK2, HOXB13, MLH1, MSH2, MSH6, PMS2, EPCAM, PALB2, RAD51C, RAD51D, and TP53.     RESULT:  Normal result. Testing did not identify any genetic changes associated with cancer predisposition.     A copy of the genetic test results are available in the Media tab dated 08/05/2021.    GENETICS ASSESSMENT:  Genetic testing was normal. Together with Adam Hoover relatively reassuring family history, this suggests that the chance for a hereditary cancer risk syndrome is very low.  It most likely means that Adam Hoover does not have a strong genetic factor contributing to his history of prostate cancer. We consider this normal result to be reassuring in terms of an inherited cancer risk syndrome. A detailed family history is critical in the accurate interpretation of a genetic test result.  If the family history is not well documented in the patient's medical record, the recommendations provided with the associated genetic test result cannot be completely individualized to the patient.        Family members may be at increased risk to develop various cancers, based on the family history. In particular, male first degree family members to the patient (ie: father, brothers, sons, as applicable) are at a somewhat increased risk of developing prostate simply due to having a close relative with prostate cancer.  Family members should discuss the family history and this normal genetic testing result with their health team to determine the most appropriate approach to cancer screening for them.      Changes in Adam Hoover's personal or family history might alter our assessment of the chance that his cancer or those in the family were due to an inherited cancer risk. In addition, future advances in genetic testing might lead to additional ways to assess cancer risk in patients with normal genetic testing for inherited cancer genetics syndromes. Future re-evaluation may be appropriate.      RECOMMENDATIONS:      1) We do not recommend additional genetic testing for Adam Hoover at this time.     2) Formal Cancer Genetics consultation is not recommended but is available to the patient if they would like to meet with [place] at a future time to discuss the cancer history and possibility for an inherited cancer risk syndrome.    3) Family members may wish to discuss the family history with their providers to determine appropriate cancer screening    _________________________________________________    [facility]  [phone]        Thank you for your e-Consult.    To summarize: Adam Hoover had negative germline genetic testing as detailed above and his family history does not support a germline cancer predisposition (although with limited documentation in the chart), thus his prostate cancer was most likely sporadic and no additional testing or consultation with genetics are recommended.  My recommendations are as follows: We do not recommend additional genetic testing or a formal genetics consultation for Adam Hoover at this time.      I spent 5-10 minutes in medical consultative discussion and review of medical records, including a written report to the treating provider via electronic health record regarding the condition of this patient.     This e-Consult did include an answerable clinical question and did not recommend a clinic visit.     Shametra Cumberland, CGC  Ronalee Belts, MD     The recommendations provided in this eConsult are based on the clinical data available to me and are furnished without the benefit of a comprehensive in-person evaluation of the patient. Any new clinical issues or changes in patient status not available to me will need to be taken into account when assessing these recommendations. The ongoing management of this patient is the responsibility of the referring clinician. Please contact me if you have further questions.

## 2021-12-03 MED FILL — NUBEQA 300 MG TABLET: ORAL | 30 days supply | Qty: 120 | Fill #9

## 2021-12-09 MED ORDER — APIXABAN 5 MG TABLET
ORAL_TABLET | Freq: Two times a day (BID) | ORAL | 11 refills | 30 days
Start: 2021-12-09 — End: ?

## 2021-12-10 MED ORDER — APIXABAN 5 MG TABLET
ORAL_TABLET | Freq: Two times a day (BID) | ORAL | 11 refills | 30 days | Status: CP
Start: 2021-12-10 — End: ?
  Filled 2021-12-10: qty 60, 30d supply, fill #0

## 2021-12-10 MED FILL — FEROSUL 325 MG (65 MG IRON) TABLET: ORAL | 30 days supply | Qty: 15 | Fill #3

## 2021-12-10 MED FILL — HYDROCHLOROTHIAZIDE 25 MG TABLET: ORAL | 90 days supply | Qty: 90 | Fill #1

## 2021-12-10 MED FILL — CARVEDILOL 3.125 MG TABLET: ORAL | 30 days supply | Qty: 120 | Fill #1

## 2022-01-03 DIAGNOSIS — C61 Malignant neoplasm of prostate: Principal | ICD-10-CM

## 2022-01-03 MED ORDER — DAROLUTAMIDE 300 MG TABLET
ORAL_TABLET | Freq: Two times a day (BID) | ORAL | 11 refills | 30 days | Status: CP
Start: 2022-01-03 — End: ?
  Filled 2022-02-21: qty 120, 30d supply, fill #0

## 2022-01-10 NOTE — Unmapped (Signed)
[facility] Clinical Assessment & Refill Coordination Note    Adam Hoover, Adam Hoover: 16-Oct-1945  Phone: [phone] (home)     All above HIPAA information was verified with patient.     Was a Nurse, learning disability used for this call? No    Specialty Medication(s):   Hematology/Oncology: Adam Hoover     Current Outpatient Medications   Medication Sig Dispense Refill    apixaban (ELIQUIS) 5 mg Tab Take 1 tablet by mouth two (2) times a day. 60 tablet 11    carvediloL (COREG) 3.125 MG tablet Take 2 tablets (6.25 mg total) by mouth Two (2) times a day. 120 tablet 11    darolutamide (NUBEQA) 300 mg tablet Take 2 tablets (600 mg total) by mouth in the morning and 2 tablets (600 mg total) in the evening. Take with meals. Take with food. Swallow tablets whole.. 120 tablet 11    diclofenac sodium (VOLTAREN) 1 % gel Apply 2 g topically two (2) times a day as needed for arthritis. 100 g 2    ferrous sulfate 325 (65 FE) MG tablet Take 1 tablet (325 mg total) by mouth every other day. 15 tablet 11    hydroCHLOROthiazide (HYDRODIURIL) 25 MG tablet Take 1 tablet (25 mg total) by mouth daily. 90 tablet 3    naloxone (NARCAN) 4 mg nasal spray One spray in either nostril once for known/suspected opioid overdose. May repeat every 2-3 minutes in alternating nostril til EMS arrives 2 each 0    oxyCODONE (ROXICODONE) 5 MG immediate release tablet Take 1/2 - 1 tablets (2.5-5 mg total) by mouth every eight (8) hours as needed for pain. 60 tablet 0    polyethylene glycol (MIRALAX) 17 gram packet Mix 1 packet (17 g) in 4 to 8 ounces of lliquid (tea, juice, or water) and drink by mouth Two (2) times a day. (Patient not taking: Reported on 08/27/2021) 60 packet 0    senna (SENOKOT) 8.6 mg tablet Take 1 tablet by mouth Two (2) times a day. (Patient not taking: Reported on 08/27/2021) 60 tablet 0    triamcinolone (KENALOG) 0.1 % ointment Apply topically Two (2) times a day. 80 g 0     No current facility-administered medications for this visit.        Changes to medications: Adam Hoover reports no changes at this time.    Allergies   Allergen Reactions    Acetaminophen Hives     Per pt report Only, tolerated well.     Penicillins Hives    Lisinopril      Angioedema        Changes to allergies: No    SPECIALTY MEDICATION ADHERENCE     darolutamide 300 mg: over 30 days of medicine on hand (Unopened bottle plus an open bottle of meds at home)    Medication Adherence    Patient reported X missed doses in the last month: 1  Specialty Medication: Nubeqa 300 mg - 2 tab (600 mg) twice daily  Patient is on additional specialty medications: No  Informant: patient                  Confirmed plan for next specialty medication refill: delivery by pharmacy  Refills needed for supportive medications: not needed          Specialty medication(s) dose(s) confirmed: Regimen is correct and unchanged.     Are there any concerns with adherence?  No - Patient missed 1 dose due to forgetting.  He reports having 1.5 bottles of medication on hand.  [facility] last dispensed #120 tablets (30 day supply) on 12/03/21    Adherence counseling provided? Not needed    CLINICAL MANAGEMENT AND INTERVENTION      Clinical Benefit Assessment:    Do you feel the medicine is effective or helping your condition? Yes    Clinical Benefit counseling provided? Not needed    Adverse Effects Assessment:    Are you experiencing any side effects? No    Are you experiencing difficulty administering your medicine? No    Quality of Life Assessment:    Quality of Life    Rheumatology  Oncology  1. What impact has your specialty medication had on the reduction of your daily pain or discomfort level?: None  2. On a scale of 1-10, how would you rate your ability to manage side effects associated with your specialty medication? (1=no issues, 10 = unable to take medication due to side effects): 1  Dermatology  Cystic Fibrosis          How many days over the past month did your prostate cancer  keep you from your normal activities? For example, brushing your teeth or getting up in the morning. 0    Have you discussed this with your provider? Not needed    Acute Infection Status:    Acute infections noted within Epic:  No active infections  Patient reported infection: None    Therapy Appropriateness:    Is therapy appropriate and patient progressing towards therapeutic goals? Yes, therapy is appropriate and should be continued    DISEASE/MEDICATION-SPECIFIC INFORMATION      N/A    PATIENT SPECIFIC NEEDS     Does the patient have any physical, cognitive, or cultural barriers? No    Is the patient high risk? Yes, patient is taking oral chemotherapy. Appropriateness of therapy as been assessed    Does the patient require a Care Management Plan? No     SOCIAL DETERMINANTS OF HEALTH     At the [facility], we have learned that life circumstances - like trouble affording food, housing, utilities, or transportation can affect the health of many of our patients.   That is why we wanted to ask: are you currently experiencing any life circumstances that are negatively impacting your health and/or quality of life? No    Social Determinants of Health     Financial Resource Strain: Low Risk  (08/11/2021)    Overall Financial Resource Strain (CARDIA)     Difficulty of Paying Living Expenses: Not very hard   Internet Connectivity: Not on file   Food Insecurity: No Food Insecurity (08/11/2021)    Hunger Vital Sign     Worried About Running Out of Food in the Last Year: Never true     Ran Out of Food in the Last Year: Never true   Tobacco Use: Medium Risk (11/16/2021)    Patient History     Smoking Tobacco Use: Former     Smokeless Tobacco Use: Never     Passive Exposure: Never   Housing/Utilities: Low Risk  (08/11/2021)    Housing/Utilities     Within the past 12 months, have you ever stayed: outside, in a car, in a tent, in an overnight shelter, or temporarily in someone else's home (i.e. couch-surfing)?: No     Are you worried about losing your housing?: No     Within the past 12 months, have you been unable to get  utilities (heat, electricity) when it was really needed?: No   Alcohol Use: Not At Risk (10/14/2020)    Alcohol Use     How often do you have a drink containing alcohol?: Monthly or less     How many drinks containing alcohol do you have on a typical day when you are drinking?: 1 - 2     How often do you have 5 or more drinks on one occasion?: Not on file   Transportation Needs: No Transportation Needs (08/11/2021)    PRAPARE - Transportation     Lack of Transportation (Medical): No     Lack of Transportation (Non-Medical): No   Substance Use: Low Risk  (10/14/2020)    Substance Use     Taken prescription drugs for non-medical reasons: Never     Taken illegal drugs: Never     Patient indicated they have taken drugs in the past year for non-medical reasons: Yes, [positive answer(s)]: Not on file   Health Literacy: Low Risk  (10/17/2021)    Health Literacy     : Never   Physical Activity: Sufficiently Active (11/20/2020)    Exercise Vital Sign     Days of Exercise per Week: 7 days     Minutes of Exercise per Session: 40 min   Interpersonal Safety: Not on file   Stress: No Stress Concern Present (11/20/2020)    [employer] of Occupational Health - Occupational Stress Questionnaire     Feeling of Stress : Not at all   Intimate Partner Violence: Not At Risk (11/20/2020)    Humiliation, Afraid, Rape, and Kick questionnaire     Fear of Current or Ex-Partner: No     Emotionally Abused: No     Physically Abused: No     Sexually Abused: No   Depression: Not at risk (11/16/2021)    PHQ-2     PHQ-2 Score: 0   Social Connections: Moderately Isolated (11/20/2020)    Social Connection and Isolation Panel [NHANES]     Frequency of Communication with Friends and Family: More than three times a week     Frequency of Social Gatherings with Friends and Family: More than three times a week     Attends Religious Services: 1 to 4 times per year Active Member of [employer] or Organizations: No     Attends Banker Meetings: Never     Marital Status: Separated     Would you be willing to receive help with any of the needs that you have identified today? Not applicable     SHIPPING     Specialty Medication(s) to be Shipped:   Hematology/Oncology: None    Other medication(s) to be shipped: No additional medications requested for fill at this time     Changes to insurance: No    Delivery Scheduled: Patient declined refill at this time due to having over 30 day supply.     Medication will be delivered via  NA  to the confirmed  NA  address in [facility].    The patient will receive a drug information handout for each medication shipped and additional FDA Medication Guides as required.  Verified that patient has previously received a Conservation officer, historic buildings and a Surveyor, mining.    The patient or caregiver noted above participated in the development of this care plan and knows that they can request review of or adjustments to the care plan at any time.      All of the  patient's questions and concerns have been addressed.    Breck Coons Shared [facility] Specialty Pharmacist

## 2022-02-04 MED FILL — FEROSUL 325 MG (65 MG IRON) TABLET: ORAL | 30 days supply | Qty: 15 | Fill #4

## 2022-02-07 ENCOUNTER — Other Ambulatory Visit: Admit: 2022-02-07 | Discharge: 2022-02-07 | Payer: MEDICARE

## 2022-02-07 ENCOUNTER — Ambulatory Visit
Admit: 2022-02-07 | Discharge: 2022-02-07 | Payer: MEDICARE | Attending: Nurse Practitioner | Primary: Nurse Practitioner

## 2022-02-07 ENCOUNTER — Ambulatory Visit: Admit: 2022-02-07 | Discharge: 2022-02-07 | Payer: MEDICARE

## 2022-02-07 DIAGNOSIS — Z515 Encounter for palliative care: Principal | ICD-10-CM

## 2022-02-07 DIAGNOSIS — R600 Localized edema: Principal | ICD-10-CM

## 2022-02-07 DIAGNOSIS — C61 Malignant neoplasm of prostate: Principal | ICD-10-CM

## 2022-02-07 DIAGNOSIS — G893 Neoplasm related pain (acute) (chronic): Principal | ICD-10-CM

## 2022-02-07 LAB — CBC W/ AUTO DIFF
BASOPHILS ABSOLUTE COUNT: 0 10*9/L (ref 0.0–0.1)
BASOPHILS RELATIVE PERCENT: 0.9 %
EOSINOPHILS ABSOLUTE COUNT: 0.1 10*9/L (ref 0.0–0.5)
EOSINOPHILS RELATIVE PERCENT: 2.1 %
HEMATOCRIT: 31.9 % — ABNORMAL LOW (ref 39.0–48.0)
HEMOGLOBIN: 10.4 g/dL — ABNORMAL LOW (ref 12.9–16.5)
LYMPHOCYTES ABSOLUTE COUNT: 0.8 10*9/L — ABNORMAL LOW (ref 1.1–3.6)
LYMPHOCYTES RELATIVE PERCENT: 21.2 %
MEAN CORPUSCULAR HEMOGLOBIN CONC: 32.7 g/dL (ref 32.0–36.0)
MEAN CORPUSCULAR HEMOGLOBIN: 30.4 pg (ref 25.9–32.4)
MEAN CORPUSCULAR VOLUME: 93 fL (ref 77.6–95.7)
MEAN PLATELET VOLUME: 8.1 fL (ref 6.8–10.7)
MONOCYTES ABSOLUTE COUNT: 0.4 10*9/L (ref 0.3–0.8)
MONOCYTES RELATIVE PERCENT: 11.5 %
NEUTROPHILS ABSOLUTE COUNT: 2.4 10*9/L (ref 1.8–7.8)
NEUTROPHILS RELATIVE PERCENT: 64.3 %
PLATELET COUNT: 308 10*9/L (ref 150–450)
RED BLOOD CELL COUNT: 3.43 10*12/L — ABNORMAL LOW (ref 4.26–5.60)
RED CELL DISTRIBUTION WIDTH: 14.4 % (ref 12.2–15.2)
WBC ADJUSTED: 3.8 10*9/L (ref 3.6–11.2)

## 2022-02-07 LAB — COMPREHENSIVE METABOLIC PANEL
ALBUMIN: 3.4 g/dL (ref 3.4–5.0)
ALKALINE PHOSPHATASE: 103 U/L (ref 46–116)
ALT (SGPT): <7 U/L — ABNORMAL LOW (ref 10–49)
ANION GAP: 12 mmol/L (ref 5–14)
AST (SGOT): 15 U/L (ref <=34)
BILIRUBIN TOTAL: 0.4 mg/dL (ref 0.3–1.2)
BLOOD UREA NITROGEN: 48 mg/dL — ABNORMAL HIGH (ref 9–23)
BUN / CREAT RATIO: 23
CALCIUM: 9.2 mg/dL (ref 8.7–10.4)
CHLORIDE: 105 mmol/L (ref 98–107)
CO2: 23 mmol/L (ref 20.0–31.0)
CREATININE: 2.06 mg/dL — ABNORMAL HIGH
EGFR CKD-EPI (2021) MALE: 33 mL/min/{1.73_m2} — ABNORMAL LOW (ref >=60)
GLUCOSE RANDOM: 108 mg/dL (ref 70–179)
POTASSIUM: 3.4 mmol/L — ABNORMAL LOW (ref 3.5–5.1)
PROTEIN TOTAL: 7.8 g/dL (ref 5.7–8.2)
SODIUM: 140 mmol/L (ref 135–145)

## 2022-02-07 LAB — PSA: PROSTATE SPECIFIC ANTIGEN: 7.45 ng/mL — ABNORMAL HIGH (ref 0.00–4.00)

## 2022-02-07 LAB — TESTOSTERONE: TESTOSTERONE TOTAL: 29 ng/dL — ABNORMAL LOW

## 2022-02-07 MED ORDER — OXYCODONE 5 MG TABLET
ORAL_TABLET | Freq: Three times a day (TID) | ORAL | 0 refills | 20 days | Status: CP | PRN
Start: 2022-02-07 — End: ?
  Filled 2022-02-07: qty 60, 20d supply, fill #0

## 2022-02-07 NOTE — Unmapped (Addendum)
Injection due in December  Continue cancer medication for now  Follow up with your primary care provider  My recommendation is to go to the ER today as I am concerned about your heart not working right  I will let you PCP  know what is going on    [name], NP-C, OCN  Adult Nurse Practitioner  Urology and Medical Oncology    Notice: Many test results will be automatically released into MyChart. This may happen before your provider has a chance to review them. Your results will either be reviewed with you at your scheduled appointment or your provider or someone from their office will reach out to you to discuss the results. Thank you for your understanding    If you have been prescribed a medication today, always read the package insert that comes with the medication.    In the event of an emergency, always call 911    Urology:  Main clinic & [place]: [phone]  Fax: [phone]    Cancer Hospital:  Phone: [phone]  Fax: [phone]    After hours/nights/weekends:  Hospital Operator: [phone]    [email or web address]  [email or web address]
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
## 2022-02-07 NOTE — Unmapped (Signed)
Peripheral stick done by Yisroel Ramming, 23g R W, labs drawn and sent.
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
## 2022-02-09 ENCOUNTER — Ambulatory Visit
Admit: 2022-02-09 | Discharge: 2022-02-13 | Disposition: A | Discharge status: Home Health | Payer: MEDICARE | Admitting: Internal Medicine

## 2022-02-09 ENCOUNTER — Ambulatory Visit: Admit: 2022-02-09 | Discharge: 2022-02-13 | Payer: MEDICARE

## 2022-02-09 ENCOUNTER — Encounter: Admit: 2022-02-09 | Discharge: 2022-02-13 | Payer: MEDICARE

## 2022-02-09 LAB — COMPREHENSIVE METABOLIC PANEL
ALBUMIN: 3.6 g/dL (ref 3.4–5.0)
ALKALINE PHOSPHATASE: 93 U/L (ref 46–116)
ALT (SGPT): <7 U/L — ABNORMAL LOW (ref 10–49)
ANION GAP: 10 mmol/L (ref 5–14)
AST (SGOT): 13 U/L (ref <=34)
BILIRUBIN TOTAL: 0.3 mg/dL (ref 0.3–1.2)
BLOOD UREA NITROGEN: 35 mg/dL — ABNORMAL HIGH (ref 9–23)
BUN / CREAT RATIO: 15
CALCIUM: 9.5 mg/dL (ref 8.7–10.4)
CHLORIDE: 105 mmol/L (ref 98–107)
CO2: 23 mmol/L (ref 20.0–31.0)
CREATININE: 2.39 mg/dL — ABNORMAL HIGH
EGFR CKD-EPI (2021) MALE: 28 mL/min/{1.73_m2} — ABNORMAL LOW (ref >=60)
GLUCOSE RANDOM: 119 mg/dL (ref 70–179)
POTASSIUM: 4 mmol/L (ref 3.4–4.8)
PROTEIN TOTAL: 8 g/dL (ref 5.7–8.2)
SODIUM: 138 mmol/L (ref 135–145)

## 2022-02-09 LAB — CBC W/ AUTO DIFF
BASOPHILS ABSOLUTE COUNT: 0 10*9/L (ref 0.0–0.1)
BASOPHILS RELATIVE PERCENT: 0.8 %
EOSINOPHILS ABSOLUTE COUNT: 0.1 10*9/L (ref 0.0–0.5)
EOSINOPHILS RELATIVE PERCENT: 1.9 %
HEMATOCRIT: 31.1 % — ABNORMAL LOW (ref 39.0–48.0)
HEMOGLOBIN: 10.4 g/dL — ABNORMAL LOW (ref 12.9–16.5)
LYMPHOCYTES ABSOLUTE COUNT: 1.1 10*9/L (ref 1.1–3.6)
LYMPHOCYTES RELATIVE PERCENT: 22 %
MEAN CORPUSCULAR HEMOGLOBIN CONC: 33.4 g/dL (ref 32.0–36.0)
MEAN CORPUSCULAR HEMOGLOBIN: 30.9 pg (ref 25.9–32.4)
MEAN CORPUSCULAR VOLUME: 92.6 fL (ref 77.6–95.7)
MEAN PLATELET VOLUME: 7.6 fL (ref 6.8–10.7)
MONOCYTES ABSOLUTE COUNT: 0.8 10*9/L (ref 0.3–0.8)
MONOCYTES RELATIVE PERCENT: 15.2 %
NEUTROPHILS ABSOLUTE COUNT: 3.1 10*9/L (ref 1.8–7.8)
NEUTROPHILS RELATIVE PERCENT: 60.1 %
PLATELET COUNT: 317 10*9/L (ref 150–450)
RED BLOOD CELL COUNT: 3.36 10*12/L — ABNORMAL LOW (ref 4.26–5.60)
RED CELL DISTRIBUTION WIDTH: 14.4 % (ref 12.2–15.2)
WBC ADJUSTED: 5.1 10*9/L (ref 3.6–11.2)

## 2022-02-09 LAB — HIGH SENSITIVITY TROPONIN I - 6 HOUR SERIAL
HIGH SENSITIVITY TROPONIN - DELTA (2-6H): 1 ng/L (ref <=7)
HIGH-SENSITIVITY TROPONIN I - 6 HOUR: 5 ng/L (ref <=53)

## 2022-02-09 LAB — B-TYPE NATRIURETIC PEPTIDE: B-TYPE NATRIURETIC PEPTIDE: 45 pg/mL (ref <=100)

## 2022-02-09 LAB — HIGH SENSITIVITY TROPONIN I - SERIAL: HIGH SENSITIVITY TROPONIN I: 3 ng/L (ref <=53)

## 2022-02-09 LAB — HIGH SENSITIVITY TROPONIN I - 2 HOUR SERIAL
HIGH SENSITIVITY TROPONIN - DELTA (0-2H): 1 ng/L (ref <=7)
HIGH-SENSITIVITY TROPONIN I - 2 HOUR: 4 ng/L (ref <=53)

## 2022-02-09 LAB — PRO-BNP: PRO-BNP: 126 pg/mL — ABNORMAL HIGH (ref 0.0–125.0)

## 2022-02-09 MED ADMIN — aztreonam (AZACTAM) 2 g in sodium chloride 0.9 % (NS) 100 mL IVPB-MBP: 2 g | INTRAVENOUS | @ 22:00:00 | Stop: 2022-02-09

## 2022-02-09 MED ADMIN — vancomycin (VANCOCIN) 2000 mg IVPB in 500 mL sodium chloride 0.9% (premix): 2000 mg | INTRAVENOUS | Stop: 2022-02-09

## 2022-02-09 MED ADMIN — metroNIDAZOLE (FLAGYL) tablet 500 mg: 500 mg | ORAL | @ 22:00:00 | Stop: 2022-02-09
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
## 2022-02-09 NOTE — Unmapped (Signed)
Patient arrives with bilateral leg and abdominal swelling for the last week. Reports being sent by oncologist to have cardiac work out.    + shortness of breath, swelling

## 2022-02-09 NOTE — Unmapped (Signed)
Pt c/o increased leg swelling and weakness.

## 2022-02-10 LAB — BASIC METABOLIC PANEL
ANION GAP: 9 mmol/L (ref 5–14)
ANION GAP: 9 mmol/L (ref 5–14)
BLOOD UREA NITROGEN: 18 mg/dL (ref 9–23)
BLOOD UREA NITROGEN: 34 mg/dL — ABNORMAL HIGH (ref 9–23)
BUN / CREAT RATIO: 16
BUN / CREAT RATIO: 17
CALCIUM: 5.4 mg/dL — CL (ref 8.7–10.4)
CALCIUM: 9.3 mg/dL (ref 8.7–10.4)
CHLORIDE: 125 mmol/L — ABNORMAL HIGH (ref 98–107)
CHLORIDE: 107 mmol/L (ref 98–107)
CO2: 15 mmol/L — ABNORMAL LOW (ref 20.0–31.0)
CO2: 23 mmol/L (ref 20.0–31.0)
CREATININE: 1.12 mg/dL — ABNORMAL HIGH
CREATININE: 1.99 mg/dL — ABNORMAL HIGH
EGFR CKD-EPI (2021) MALE: 69 mL/min/{1.73_m2} (ref >=60)
EGFR CKD-EPI (2021) MALE: 35 mL/min/{1.73_m2} — ABNORMAL LOW (ref >=60)
GLUCOSE RANDOM: 73 mg/dL (ref 70–179)
GLUCOSE RANDOM: 109 mg/dL (ref 70–179)
POTASSIUM: 1.8 mmol/L — CL (ref 3.4–4.8)
POTASSIUM: 3 mmol/L — ABNORMAL LOW (ref 3.4–4.8)
SODIUM: 149 mmol/L — ABNORMAL HIGH (ref 135–145)
SODIUM: 139 mmol/L (ref 135–145)

## 2022-02-10 LAB — MAGNESIUM
MAGNESIUM: 0.8 mg/dL — CL (ref 1.6–2.6)
MAGNESIUM: 1.4 mg/dL — ABNORMAL LOW (ref 1.6–2.6)

## 2022-02-10 LAB — BLOOD GAS CRITICAL CARE PANEL, VENOUS
BASE EXCESS VENOUS: -2.3 — ABNORMAL LOW (ref -2.0–2.0)
CALCIUM IONIZED VENOUS (MG/DL): 5.04 mg/dL (ref 4.40–5.40)
GLUCOSE WHOLE BLOOD: 112 mg/dL (ref 70–179)
HCO3 VENOUS: 23 mmol/L (ref 22–27)
HEMOGLOBIN BLOOD GAS: 10.1 g/dL — ABNORMAL LOW
LACTATE BLOOD VENOUS: 1.2 mmol/L (ref 0.5–1.8)
O2 SATURATION VENOUS: 56.3 % (ref 40.0–85.0)
PCO2 VENOUS: 47 mmHg (ref 40–60)
PH VENOUS: 7.31 — ABNORMAL LOW (ref 7.32–7.43)
PO2 VENOUS: 34 mmHg (ref 30–55)
POTASSIUM WHOLE BLOOD: 3 mmol/L — ABNORMAL LOW (ref 3.4–4.6)
SODIUM WHOLE BLOOD: 141 mmol/L (ref 135–145)

## 2022-02-10 LAB — CBC
HEMATOCRIT: 21.2 % — ABNORMAL LOW (ref 39.0–48.0)
HEMATOCRIT: 30.3 % — ABNORMAL LOW (ref 39.0–48.0)
HEMOGLOBIN: 6.7 g/dL — ABNORMAL LOW (ref 12.9–16.5)
HEMOGLOBIN: 10.2 g/dL — ABNORMAL LOW (ref 12.9–16.5)
MEAN CORPUSCULAR HEMOGLOBIN CONC: 31.4 g/dL — ABNORMAL LOW (ref 32.0–36.0)
MEAN CORPUSCULAR HEMOGLOBIN CONC: 33.6 g/dL (ref 32.0–36.0)
MEAN CORPUSCULAR HEMOGLOBIN: 30.2 pg (ref 25.9–32.4)
MEAN CORPUSCULAR HEMOGLOBIN: 31.1 pg (ref 25.9–32.4)
MEAN CORPUSCULAR VOLUME: 96.2 fL — ABNORMAL HIGH (ref 77.6–95.7)
MEAN CORPUSCULAR VOLUME: 92.6 fL (ref 77.6–95.7)
MEAN PLATELET VOLUME: 7.3 fL (ref 6.8–10.7)
MEAN PLATELET VOLUME: 7.5 fL (ref 6.8–10.7)
PLATELET COUNT: 210 10*9/L (ref 150–450)
PLATELET COUNT: 316 10*9/L (ref 150–450)
RED BLOOD CELL COUNT: 2.2 10*12/L — ABNORMAL LOW (ref 4.26–5.60)
RED BLOOD CELL COUNT: 3.27 10*12/L — ABNORMAL LOW (ref 4.26–5.60)
RED CELL DISTRIBUTION WIDTH: 14.4 % (ref 12.2–15.2)
RED CELL DISTRIBUTION WIDTH: 14.6 % (ref 12.2–15.2)
WBC ADJUSTED: 3.1 10*9/L — ABNORMAL LOW (ref 3.6–11.2)
WBC ADJUSTED: 4.7 10*9/L (ref 3.6–11.2)

## 2022-02-10 MED ADMIN — magnesium sulfate 2gm/50mL IVPB: 2 g | INTRAVENOUS | @ 10:00:00 | Stop: 2022-02-10

## 2022-02-10 MED ADMIN — ferrous sulfate tablet 325 mg: 325 mg | ORAL | @ 13:00:00

## 2022-02-10 MED ADMIN — aztreonam (AZACTAM) 2 g in sodium chloride 0.9 % (NS) 100 mL IVPB-MBP: 2 g | INTRAVENOUS | @ 10:00:00 | Stop: 2022-02-15

## 2022-02-10 MED ADMIN — oxyCODONE (ROXICODONE) immediate release tablet 5 mg: 5 mg | ORAL | @ 14:00:00 | Stop: 2022-02-23

## 2022-02-10 MED ADMIN — potassium chloride ER tablet 40 mEq: 40 meq | ORAL | @ 10:00:00 | Stop: 2022-02-10

## 2022-02-10 MED ADMIN — carvediloL (COREG) tablet 6.25 mg: 6.25 mg | ORAL | @ 13:00:00

## 2022-02-10 MED ADMIN — hydroCHLOROthiazide (HYDRODIURIL) tablet 25 mg: 25 mg | ORAL | @ 13:00:00

## 2022-02-10 MED ADMIN — carvediloL (COREG) tablet 6.25 mg: 6.25 mg | ORAL | @ 03:00:00

## 2022-02-10 MED ADMIN — apixaban (ELIQUIS) tablet 5 mg: 5 mg | ORAL | @ 13:00:00

## 2022-02-10 MED ADMIN — oxyCODONE (ROXICODONE) immediate release tablet 5 mg: 5 mg | ORAL | @ 06:00:00 | Stop: 2022-02-23

## 2022-02-10 MED ADMIN — senna (SENOKOT) tablet 1 tablet: 1 | ORAL | @ 13:00:00

## 2022-02-10 MED ADMIN — furosemide (LASIX) injection 20 mg: 20 mg | INTRAVENOUS | @ 03:00:00 | Stop: 2022-02-09

## 2022-02-10 MED ADMIN — potassium chloride ER tablet 40 mEq: 40 meq | ORAL | @ 13:00:00 | Stop: 2022-02-10

## 2022-02-10 MED ADMIN — aztreonam (AZACTAM) 2 g in sodium chloride 0.9 % (NS) 100 mL IVPB-MBP: 2 g | INTRAVENOUS | @ 23:00:00 | Stop: 2022-02-15

## 2022-02-10 MED ADMIN — apixaban (ELIQUIS) tablet 5 mg: 5 mg | ORAL | @ 03:00:00

## 2022-02-10 MED ADMIN — senna (SENOKOT) tablet 1 tablet: 1 | ORAL | @ 03:00:00
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
## 2022-02-10 NOTE — Unmapped (Signed)
Bed: HALL-01B  Expected date:   Expected time:   Means of arrival:   Comments:

## 2022-02-10 NOTE — Unmapped (Signed)
Pt currently admitted into the hospital.
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
## 2022-02-10 NOTE — Unmapped (Addendum)
Vancomycin Therapeutic Monitoring Pharmacy Note    Adam Hoover is a 76 y.o. male starting vancomycin. Date of therapy initiation: 02/09/22    Indication: Skin and Soft Tissue Infection (SSTI)    Prior Dosing Information: None/new initiation s/p vancomycin 2000 mg IV x1 in ED (administered 02/09/22 at 1950)    Goals:  Therapeutic Drug Levels  Vancomycin trough goal: 10-15 mg/L    Additional Clinical Monitoring/Outcomes  Renal function, volume status (intake and output)    Results: Not applicable    Wt Readings from Last 1 Encounters:   02/07/22 (!) 115.5 kg (254 lb 9.6 oz)     Creatinine   Date Value Ref Range Status   02/10/2022 1.99 (H) 0.60 - 1.10 mg/dL Final   95/62/1308 6.57 (H) 0.60 - 1.10 mg/dL Final   84/69/6295 2.84 (H) 0.60 - 1.10 mg/dL Final        Pharmacokinetic Considerations and Significant Drug Interactions:  Adult (estimated initial): Vd = 82 L, ke = 0.033 hr-1  Concurrent nephrotoxic meds: not applicable    Assessment/Plan:  Recommendation(s)  Start vancomycin 13244 mg IV q24 hours  Estimated trough on recommended regimen:  ~15 mg/L    Follow-up  Level due: prior to 3rd dose as safety check - sooner if renal function worsens  A pharmacist will continue to monitor and order levels as appropriate    Please page service pharmacist with questions/clarifications.    Rance Muir, PharmD
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
## 2022-02-10 NOTE — Unmapped (Addendum)
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 *           Adam Hoover is a 76 y.o. male who presented to [facility] with Venous stasis dermatitis of both lower extremities.    Worsening venous stasis dermatitis with possible superimposed cellulitis: Presented from clinic with worsening lower extremity edema and increased warmth, tenderness, drainage from chronic lower extremity venous stasis ulcers.  No evidence of systemic infection normal WBCs and no fevers.  Wounds do not appear obviously infected and no definitive cellulitis although difficult to be certain given increased warmth and worsening pain.  -Continue IV vancomycin and aztreonam  -IV Lasix 20 mg x 1 on admission for assistance with LE edema  -WOCN consult  -Previously had home health with compression wraps would likely benefit from resumption  -Pain control with home oxycodone  -Consider derm consult    Hypokalemia, hypomagnesemia: Replaced with PO and IV today, 9/14  -AM BMP    Metastatic hormone sensitive prostate cancer: Follows with Dr. Philomena Course and Barkley Bruns, NP in addition to palliative care.  Most recent PSA stable.  He does not have his home darolutamide with him.  Reports his cancer related pain is not well controlled. CT abdomen on admission with lesion in kidney and liver that do not look like they were seen on prior imaging.   - have reached out to primary oncologist for guidance on new further workup/imaging while admitted, if any  -Consider inpatient palliative care consult if cancer related pain is difficult to control  -Continue home oxycodone q6 PRN  -Could resume home darolutamide if he brings it to the hospital  -Has outpatient follow-up scheduled for 05/09/22    CKD: Baseline creatinine around 2.0.  Slightly up from baseline on admission at 2.3 but resolved on recheck.  -Monitor periodically    Hypertension:  -Continue home Coreg, HCTZ    History of PE:  -Continue home Eliquis    Chronic colonic dilation:  Present on scans dating back to 2020 and again seen on CT A/P in the ED with slight increase compared to March 2023 scan.  Continues passing flatus and having bowel movements without significant GI symptoms.  Abdomen quite distended on exam but soft and nontender.  -Monitor    Deconditioning: Related to age, lower extremity wounds, metastatic prostate cancer, pain, obesity with BMI greater than 30  - PT/OT        I personally spent 75 minutes face-to-face and non-face-to-face in the care of this patient, which includes all pre, intra, and post visit time on the date of service.  All documented time was specific to the E/M visit and does not include any procedures that may have been performed.     ___________________________________________________________________    Subjective:  Lying in bed.  Frustrated to be in the hospital.  Assisted with changing his close.  States the pain  from a prostate stuff is not well controlled and also with worsened bilateral lower extremity pain.    Labs/Studies:  Labs and Studies from the last 24hrs per EMR  and Reviewed    Objective:  Temp:  [36.6 ??C (97.9 ??F)-36.7 ??C (98.1 ??F)] 36.7 ??C (98.1 ??F)  Heart Rate:  [70-87] 87  SpO2 Pulse:  [71-83] 78  Resp:  [13-22] 15  BP: (108-161)/(77-136) 117/84  SpO2:  [97 %-100 %] 98 %    GEN: Lying in bed, no acute distress  HEENT: sclera anicteric, moist mucous membranes, poor dentition, gingival hyperplasia  CV: RRR, no murmur  RESP: CTAB, normal WOB  ABD: soft, non-tender, BS normoactive, abdomen distended  EXT: BLE edema, with chronic venous stasis changes, bilateral lower extremities warm but without significant erythema see photos in media tab.  NEURO: Able to break gravity with all extremities non-focal            Wound 08/11/21 Pretibial Right;Anterior;Posterior weeping edema LLE (Active)       Wound 08/11/21 Pretibial Left;Anterior;Posterior;Lower weeping edema (Active)

## 2022-02-10 NOTE — Unmapped (Addendum)
Adam Hoover is a 76 y.o. male who presented to [facility] with Venous stasis dermatitis of both lower extremities.     Worsening venous stasis dermatitis with possible superimposed cellulitis: Presented from clinic with worsening lower extremity edema and increased warmth, tenderness, drainage from chronic lower extremity venous stasis ulcers.  No evidence of systemic infection normal WBCs and no fevers***.  Wounds did not appear obviously infected and no definitive cellulitis although difficult to be certain given increased warmth and worsening pain.  He was treated with IV vancomycin and aztreonam and received IV Lasix 20 mg x 1*** for assistance with LE edema. WOCN consulted and recommended***.  His pain was controlled with home oxycodone***.  Ultimately antibiotics were transitioned to***     Hypokalemia, hypomagnesemia: Replaced with PO and IV resolution.***      Metastatic hormone sensitive prostate cancer: Follows with Dr. Philomena Course and Barkley Bruns, NP in addition to palliative care.  Most recent PSA stable.  He did not have his home darolutamide with him.  Continued home oxycodone q6 PRN. Has outpatient follow-up scheduled for 05/09/22     CKD: Baseline creatinine around 2.0.  Slightly up from baseline on admission at 2.3 but resolved on recheck.      Hypertension: Continued home Coreg, HCTZ     History of PE: Continued  home Eliquis     Chronic colonic dilation:  Present on scans dating back to 2020 and again seen on CT A/P in the ED with slight increase compared to March 2023 scan.  Continued passing flatus and having bowel movements without significant GI symptoms.  Abdomen quite distended on exam but soft and nontender.    Deconditioning: Related to age, lower extremity wounds, metastatic prostate cancer, pain, and obesity with BMI greater than 30.  He was evaluated by PT/OT who recommended ***    Incidental CT findings requiring follow-up: CT abdomen on admission showed right kidney lower pole intermediate attenuation lesion, which demonstrating internal septations and hepatic segment VIII lesion, measuring up to 1.5 cm further evaluation with MRI was recommended. Reached out to oncology for further guidance***.

## 2022-02-11 LAB — BASIC METABOLIC PANEL
ANION GAP: 10 mmol/L (ref 5–14)
BLOOD UREA NITROGEN: 29 mg/dL — ABNORMAL HIGH (ref 9–23)
BUN / CREAT RATIO: 17
CALCIUM: 9.3 mg/dL (ref 8.7–10.4)
CHLORIDE: 110 mmol/L — ABNORMAL HIGH (ref 98–107)
CO2: 21 mmol/L (ref 20.0–31.0)
CREATININE: 1.66 mg/dL — ABNORMAL HIGH
EGFR CKD-EPI (2021) MALE: 43 mL/min/{1.73_m2} — ABNORMAL LOW (ref >=60)
GLUCOSE RANDOM: 113 mg/dL (ref 70–179)
POTASSIUM: 3.4 mmol/L (ref 3.4–4.8)
SODIUM: 141 mmol/L (ref 135–145)

## 2022-02-11 LAB — CBC
HEMATOCRIT: 30.9 % — ABNORMAL LOW (ref 39.0–48.0)
HEMOGLOBIN: 10.3 g/dL — ABNORMAL LOW (ref 12.9–16.5)
MEAN CORPUSCULAR HEMOGLOBIN CONC: 33.3 g/dL (ref 32.0–36.0)
MEAN CORPUSCULAR HEMOGLOBIN: 30.8 pg (ref 25.9–32.4)
MEAN CORPUSCULAR VOLUME: 92.4 fL (ref 77.6–95.7)
MEAN PLATELET VOLUME: 7.9 fL (ref 6.8–10.7)
PLATELET COUNT: 313 10*9/L (ref 150–450)
RED BLOOD CELL COUNT: 3.35 10*12/L — ABNORMAL LOW (ref 4.26–5.60)
RED CELL DISTRIBUTION WIDTH: 14.3 % (ref 12.2–15.2)
WBC ADJUSTED: 6.1 10*9/L (ref 3.6–11.2)

## 2022-02-11 LAB — VANCOMYCIN, RANDOM: VANCOMYCIN RANDOM: 19.3 ug/mL

## 2022-02-11 LAB — MAGNESIUM: MAGNESIUM: 1.5 mg/dL — ABNORMAL LOW (ref 1.6–2.6)

## 2022-02-11 LAB — PHOSPHORUS: PHOSPHORUS: 2.7 mg/dL (ref 2.4–5.1)

## 2022-02-11 MED ADMIN — apixaban (ELIQUIS) tablet 5 mg: 5 mg | ORAL | @ 12:00:00

## 2022-02-11 MED ADMIN — carvediloL (COREG) tablet 6.25 mg: 6.25 mg | ORAL | @ 12:00:00

## 2022-02-11 MED ADMIN — vancomycin (VANCOCIN) 1500 mg in sodium chloride (NS) 0.9 % 500 mL IVPB (premix): 1500 mg | INTRAVENOUS | @ 01:00:00 | Stop: 2022-02-17

## 2022-02-11 MED ADMIN — aztreonam (AZACTAM) 2 g in sodium chloride 0.9 % (NS) 100 mL IVPB-MBP: 2 g | INTRAVENOUS | @ 22:00:00 | Stop: 2022-02-15

## 2022-02-11 MED ADMIN — aztreonam (AZACTAM) 2 g in sodium chloride 0.9 % (NS) 100 mL IVPB-MBP: 2 g | INTRAVENOUS | @ 09:00:00 | Stop: 2022-02-15

## 2022-02-11 MED ADMIN — senna (SENOKOT) tablet 1 tablet: 1 | ORAL | @ 01:00:00

## 2022-02-11 MED ADMIN — hydroCHLOROthiazide (HYDRODIURIL) tablet 25 mg: 25 mg | ORAL | @ 12:00:00

## 2022-02-11 MED ADMIN — oxyCODONE (ROXICODONE) immediate release tablet 5 mg: 5 mg | ORAL | @ 02:00:00 | Stop: 2022-02-23

## 2022-02-11 MED ADMIN — apixaban (ELIQUIS) tablet 5 mg: 5 mg | ORAL | @ 01:00:00

## 2022-02-11 MED ADMIN — magnesium sulfate 2gm/50mL IVPB: 2 g | INTRAVENOUS | @ 15:00:00 | Stop: 2022-02-11

## 2022-02-11 MED ADMIN — carvediloL (COREG) tablet 6.25 mg: 6.25 mg | ORAL | @ 01:00:00

## 2022-02-11 MED ADMIN — senna (SENOKOT) tablet 1 tablet: 1 | ORAL | @ 12:00:00

## 2022-02-11 NOTE — Unmapped (Addendum)
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  Patient is alert and oriented x 4 with a history of metastatic prostate cancer, HTN, prior PE, and chronic lower extremity edema/stasis that presents to [facility] with Venous stasis dermatitis of both lower extremities.    Patient assessed on [facility].  Upon my assessment, BLE open to air with no drainage or weeping noted.  Patient has scabbed area to left pretibial aspect that is adherent.  No drainage noted.  Patient has chronic venous stasis and lymphedema to BLE with hemosiderin staining, dry, flaking cobblestone skin noted.  No open areas noted.  No weeping.  Bilateral legs and feet washed with vashe.  Patient would benefit from moisturizing lotion and elevation of BLE.  Patient would benefit from outpatient lymphedema clinic for outpatient management.  Patient has home health set up for home care.  No dressings needed at this time.      Right leg        Left leg             Lab Results   Component Value Date    WBC 6.1 02/11/2022    HGB 10.3 (L) 02/11/2022    HCT 30.9 (L) 02/11/2022    ESR 46 (H) 07/23/2015    CRP 40.9 (H) 07/23/2015    A1C 5.3 02/27/2019    GLUF 95 01/30/2013    GLU 113 02/11/2022    POCGLU 114 07/18/2015    ALBUMIN 3.6 02/09/2022    PROT 8.0 02/09/2022     Lower Extremity Distal Pulses:   - Dorsalis Pedis (DP) - Right: Palpable  - Dorsalis Pedis (DP) - Left: Palpable    Compression Therapy:   ABI in Last 3 Months: No     Protective Sensation Foot:   Left: Present  Right: Present    Offloading:  Left: Pillow  Right: Pillow    Type Debridement Completed By WOCN:  N/A    Teaching:  - Elevation of extremity  - Outpatient wound care    WOCN Recommendations:   - See nursing orders for wound care instructions.  - Contact WOCN with questions, concerns, or wound deterioration.    Topical Therapy/Interventions:   - Antimicrobial Solutions  - Moisturizing lotion    Recommended Consults:  - Certified Foot Care Nurse  - Outpatient lymphedema management recommended    WOCN Follow Up:  - We will sign off at this time    Plan of Care Discussed With:   - Patient  - Family  - RN primary    Supplies Ordered: No    Workup Time:   45 minutes  Clide Deutscher, BSN, RN, CWOCN   [phone] at [facility]  [phone] at [facility] when available

## 2022-02-11 NOTE — Unmapped (Signed)
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 *           Adam Hoover is a 76 y.o. male who presented to [facility] with Venous stasis dermatitis of both lower extremities.    Worsening venous stasis dermatitis with possible superimposed cellulitis: Presented from clinic with worsening lower extremity edema and increased warmth, tenderness, drainage from chronic lower extremity venous stasis ulcers.  No evidence of systemic infection normal WBCs and no fevers.  Wounds do not appear obviously infected and no definitive cellulitis although difficult to be certain given increased warmth and worsening pain.  -Continue IV vancomycin and aztreonam  -WOCN consult pending  -Previously had home health with compression wraps would likely benefit from resumption --declining at this time  -Pain control with home oxycodone  -Consider derm consult for biopsy/culture to evaluate for associated ulcer infection and to guide antimicrobial therapy   -Elevate BLE as able    Hypokalemia, hypomagnesemia: Unclear etiology.  Ongoing hypomagnesemia despite repletion.  -Will give 2 g of IV magnesium sulfate today, 9/15  -BMP, magnesium level in the a.m.    Metastatic hormone sensitive prostate cancer: Follows with Dr. Philomena Course and Barkley Bruns, NP in addition to palliative care.  Most recent PSA stable.  He does not have his home darolutamide with him.  Reports his cancer related pain is not well controlled. CT abdomen on admission with lesion in kidney and liver that do not look like they were seen on prior imaging.  Discussed with patient's outpatient primary oncologist who recommended MRI imaging for further evaluation.  -MRI of abdomen pelvis with and without contrast ordered  -Consider inpatient palliative care consult if cancer related pain is difficult to control  -Continue home oxycodone q6 PRN  -Could resume home darolutamide if he brings it to the hospital  -Has outpatient follow-up scheduled for 05/09/22    CKD: Baseline creatinine around 2.0.  Slightly up from baseline on admission at 2.3 however gradually improving.  -Monitor periodically    Hypertension: Currently normotensive.  -Continue home Coreg, HCTZ    History of PE:  -Continue home Eliquis    Chronic colonic dilation:  Present on scans dating back to 2020 and again seen on CT A/P in the ED with slight increase compared to March 2023 scan.  Continues passing flatus and having bowel movements without significant GI symptoms.  Abdomen quite distended on exam but soft and nontender.  -Monitor    Deconditioning: Related to age, lower extremity wounds, metastatic prostate cancer, pain, obesity with BMI greater than 30  - PT/OT    I personally spent 35 minutes face-to-face and non-face-to-face in the care of this patient, which includes all pre, intra, and post visit time on the date of service.  All documented time was specific to the E/M visit and does not include any procedures that may have been performed.     ___________________________________________________________________  Subjective:  Patient seen and examined.  No acute events overnight.  Patient lying in bed watching television.  Initially appears frustrated with Clinical research associate though mood gradually improved.  States that the pain in his BLE is currently a 4 out of 10 on the pain scale.  Discussed utilization of compression stockings while hospitalized, however patient declining at this time.  Reports that he is otherwise comfortable.  Denies any chest pain or shortness of breath.  Denies any nausea, vomiting, or abdominal pain.  States that his last bowel movement was yesterday.  Reports having adequate oral intake.  Patient appears in no acute distress.    Labs/Studies:  Labs and Studies from the last 24hrs per EMR and Reviewed    Objective:  Temp:  [36.4 ??C (97.5 ??F)-36.8 ??C (98.2 ??F)] 36.8 ??C (98.2 ??F)  Heart Rate:  [87-92] 92  SpO2 Pulse:  [88] 88  Resp:  [16-18] 16  BP: (117-145)/(69-84) 125/69  SpO2:  [97 %-100 %] 98 %    General: awake, alert. No acute distress.   Skin: Warm, dry, and intact.  Head: Normocephalic.  Eyes: Conjunctivae clear without exudates or hemorrhage. Eyelids normal in appearance without swelling or lesions.  Cardiac: Heart rate and rhythm normal, no edema in extremities, 2+ radial pulses bilaterally  Psychiatric: appropriate mood and affect  Extremities: 2+ nonpitting edema of BLE with chronic venous stasis related skin changes.  Noted small ulcerations on BLE without active drainage.  Patient reporting significant discomfort with light touch.  Neurological: Awake, alert with normal speech.  No gross neurological deficits.  GU: Deferred  GI: Abdomen soft and symmetric without distention, bowel sounds present and normoactive in all four quadrants. No wincing or grimacing with palpation.  Respiratory: chest wall symmetric and without deformity, no respiratory distress, lung sounds CTA.

## 2022-02-11 NOTE — Unmapped (Signed)
[facility] - [employer] [facility] Available   Home Living Aide Services, Home Medical Social Services, Home Nursing, Home Rehabilitation      Address   [address] [place] [address]             Contact Information    [phone]   [phone]

## 2022-02-11 NOTE — Unmapped (Signed)
Pt AOX4, pain in lower legs addressed with PRN pain medications. Pt ambulated with walker (assisted) to bathroom and had bowel movement. Pt got short of breath with this task and took breaks. Pt does have a productive cough (clear). Pt states this is nothing new RNWCTM  Problem: Adult Inpatient Plan of Care  Goal: Plan of Care Review  Outcome: Progressing  Goal: Patient-Specific Goal (Individualized)  Outcome: Progressing  Goal: Absence of Hospital-Acquired Illness or Injury  Outcome: Progressing  Intervention: Prevent and Manage VTE (Venous Thromboembolism) Risk  Recent Flowsheet Documentation  Taken 02/10/2022 2000 by Honor Junes, RN  Activity Management:   activity adjusted per tolerance   ambulated to bathroom  Goal: Optimal Comfort and Wellbeing  Outcome: Progressing  Goal: Readiness for Transition of Care  Outcome: Progressing  Goal: Rounds/Family Conference  Outcome: Progressing     Problem: Impaired Wound Healing  Goal: Optimal Wound Healing  Outcome: Progressing  Intervention: Promote Wound Healing  Recent Flowsheet Documentation  Taken 02/10/2022 2000 by Honor Junes, RN  Activity Management:   activity adjusted per tolerance   ambulated to bathroom     Problem: Fall Injury Risk  Goal: Absence of Fall and Fall-Related Injury  Outcome: Progressing     Problem: Self-Care Deficit  Goal: Improved Ability to Complete Activities of Daily Living  Outcome: Progressing

## 2022-02-12 LAB — BASIC METABOLIC PANEL
ANION GAP: 7 mmol/L (ref 5–14)
BLOOD UREA NITROGEN: 29 mg/dL — ABNORMAL HIGH (ref 9–23)
BUN / CREAT RATIO: 19
CALCIUM: 9 mg/dL (ref 8.7–10.4)
CHLORIDE: 112 mmol/L — ABNORMAL HIGH (ref 98–107)
CO2: 22 mmol/L (ref 20.0–31.0)
CREATININE: 1.55 mg/dL — ABNORMAL HIGH
EGFR CKD-EPI (2021) MALE: 47 mL/min/{1.73_m2} — ABNORMAL LOW (ref >=60)
GLUCOSE RANDOM: 120 mg/dL (ref 70–179)
POTASSIUM: 3.3 mmol/L — ABNORMAL LOW (ref 3.4–4.8)
SODIUM: 141 mmol/L (ref 135–145)

## 2022-02-12 LAB — PHOSPHORUS: PHOSPHORUS: 3.1 mg/dL (ref 2.4–5.1)

## 2022-02-12 LAB — MAGNESIUM: MAGNESIUM: 1.7 mg/dL (ref 1.6–2.6)

## 2022-02-12 MED ADMIN — apixaban (ELIQUIS) tablet 5 mg: 5 mg | ORAL | @ 01:00:00

## 2022-02-12 MED ADMIN — senna (SENOKOT) tablet 1 tablet: 1 | ORAL | @ 12:00:00

## 2022-02-12 MED ADMIN — carvediloL (COREG) tablet 6.25 mg: 6.25 mg | ORAL | @ 01:00:00

## 2022-02-12 MED ADMIN — gadobenate dimeglumine (MULTIHANCE) 529 mg/mL (0.1mmol/0.2mL) solution 10 mL: 10 mL | INTRAVENOUS | @ 16:00:00 | Stop: 2022-02-12

## 2022-02-12 MED ADMIN — apixaban (ELIQUIS) tablet 5 mg: 5 mg | ORAL | @ 12:00:00

## 2022-02-12 MED ADMIN — ferrous sulfate tablet 325 mg: 325 mg | ORAL | @ 12:00:00

## 2022-02-12 MED ADMIN — ammonium lactate (LAC-HYDRIN) 12 % lotion 1 application.: 1 | TOPICAL | @ 12:00:00

## 2022-02-12 MED ADMIN — ammonium lactate (LAC-HYDRIN) 12 % lotion 1 application.: 1 | TOPICAL | @ 01:00:00

## 2022-02-12 MED ADMIN — aztreonam (AZACTAM) 2 g in sodium chloride 0.9 % (NS) 100 mL IVPB-MBP: 2 g | INTRAVENOUS | @ 11:00:00 | Stop: 2022-02-12

## 2022-02-12 MED ADMIN — carvediloL (COREG) tablet 6.25 mg: 6.25 mg | ORAL | @ 12:00:00

## 2022-02-12 MED ADMIN — vancomycin (VANCOCIN) 1500 mg in sodium chloride (NS) 0.9 % 500 mL IVPB (premix): 1500 mg | INTRAVENOUS | @ 01:00:00 | Stop: 2022-02-17

## 2022-02-12 MED ADMIN — oxyCODONE (ROXICODONE) immediate release tablet 5 mg: 5 mg | ORAL | @ 18:00:00 | Stop: 2022-02-23

## 2022-02-12 MED ADMIN — hydroCHLOROthiazide (HYDRODIURIL) tablet 25 mg: 25 mg | ORAL | @ 12:00:00

## 2022-02-12 MED ADMIN — senna (SENOKOT) tablet 1 tablet: 1 | ORAL | @ 01:00:00

## 2022-02-12 MED ADMIN — potassium chloride ER tablet 40 mEq: 40 meq | ORAL | @ 12:00:00 | Stop: 2022-02-12

## 2022-02-12 NOTE — Unmapped (Signed)
Vancomycin Therapeutic Monitoring Pharmacy Note    Adam Hoover is a 76 y.o. male continuing vancomycin. Date of therapy initiation: 02/09/22    Indication: Skin and Soft Tissue Infection (SSTI)    Prior Dosing Information: Current regimen 1500 mg IV q24 hrs     Goals:  Therapeutic Drug Levels  Vancomycin trough goal: 10-15 mg/L    Additional Clinical Monitoring/Outcomes  Renal function, volume status (intake and output)    Results: Vancomycin level: 19.3 mg/L, drawn ~1 hr early (true trough ~18.5 mg/L)    Wt Readings from Last 1 Encounters:   02/11/22 (!) 116.1 kg (256 lb)     Creatinine   Date Value Ref Range Status   02/12/2022 1.55 (H) 0.60 - 1.10 mg/dL Final   16/02/9603 5.40 (H) 0.60 - 1.10 mg/dL Final   98/03/9146 8.29 (H) 0.60 - 1.10 mg/dL Final        Pharmacokinetic Considerations and Significant Drug Interactions:  Adult (estimated initial): Vd = 82 L, ke = 0.033 hr-1  Concurrent nephrotoxic meds: not applicable    Assessment/Plan:  Recommendation(s)  Change current regimen to 1g IV q24 hrs.  Estimated trough on recommended regimen:  10-15 mg/L    Follow-up  Level due: prior to 3rd dose as safety check - sooner if renal function worsens  A pharmacist will continue to monitor and order levels as appropriate    Please page service pharmacist with questions/clarifications.    Riccardo Dubin, PharmD

## 2022-02-12 NOTE — Unmapped (Signed)
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 *           Adam Hoover is a 76 y.o. male who presented to [facility] with Venous stasis dermatitis of both lower extremities.    Worsening venous stasis dermatitis: Presented from clinic with worsening lower extremity edema and increased warmth, tenderness, drainage from chronic lower extremity venous stasis ulcers. Patient does have a known history of venous stasis dermatitis that has been managed conservatively outpatient.  No evidence of systemic infection normal WBCs and no fevers.  Wounds do not appear obviously infected and no definitive cellulitis, therefore, will discontinue antibiotic therapy today, 9/16.  -Discontinue IV vancomycin and aztreonam today, 9/16  -Appreciate WOCN consult   -Start localized wound care with Vashe and ammonium lactate twice daily  -Previously had home health with compression wraps would likely benefit from resumption  -Pain control with home oxycodone  -Referral to lymphedema clinic at discharge  -Elevate BLE as able    Hypokalemia, hypomagnesemia: Likely secondary to diuretics.  Magnesium level improved today, 9/16, though noted mild hypokalemia.  -Will give 40 mEq of p.o. potassium chloride today, 9/16  -BMP, magnesium level in the a.m.    Metastatic hormone sensitive prostate cancer: Follows with Dr. Philomena Course and Barkley Bruns, NP in addition to palliative care.  Most recent PSA stable.  Currently on androgen deprivation therapy (ADT) plus darolutamide. He does not have his home darolutamide with him.  Reports his cancer related pain is not well controlled. CT abdomen on admission with lesion in kidney and liver that do not look like they were seen on prior imaging.  Discussed with patient's outpatient primary oncologist who recommended MRI imaging for further evaluation.  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  -Consider inpatient palliative care consult if cancer related pain is difficult to control  -Continue home oxycodone q6 PRN  -Could resume home darolutamide if he brings it to the hospital  -Has outpatient follow-up scheduled for 05/09/22    CKD: Baseline creatinine around ~1.6.  Slightly up from baseline on admission at 2.3, however gradually improving.  -Monitor periodically    Hypertension: Currently normotensive.  -Continue home Coreg, HCTZ    History of PE:  -Continue home Eliquis    Chronic colonic dilation:  Present on scans dating back to 2020 and again seen on CT A/P in the ED with slight increase compared to March 2023 scan.  Continues passing flatus and having bowel movements without significant GI symptoms.  Abdomen quite distended on exam but soft and nontender.  -Monitor    Deconditioning: Related to age, lower extremity wounds, metastatic prostate cancer, pain, obesity with BMI greater than 30  - PT/OT    Disposition: PT and OT recommending 5 times low weekly.  Patient declining SNF placement at this time.  Case management following.  Anticipate discharge home tomorrow, 9/17, with home health/PT/OT.    I personally spent 35 minutes face-to-face and non-face-to-face in  the care of this patient, which includes all pre, intra, and post visit time on the date of service.  All documented time was specific to the E/M visit and does not include any procedures that may have been performed.     ___________________________________________________________________  Subjective:  Patient seen and examined.  No acute events overnight.  Patient seen after MRI.  Lying in bed watching television.  Reports having moderate pain in his BLE after his MRI, and inquiring about his as needed oxycodone.  Denies any chest pain or shortness of breath.  Denies any nausea, vomiting, or abdominal pain.  Lunch tray delivered during examination.  Patient reports having a bowel movement this morning.  MRI findings discussed with patient.  Discussed discharge home today with home health.  Patient reporting that he would be more comfortable staying, 1 more night  with discharge tomorrow.  Encouraged patient to be out of bed as tolerated today with anticipation for discharge tomorrow.  Patient verbalized understanding.  Discussed compression therapy for his BLE today, and patient continues to decline.  Per patient request, attempted to call patient's daughter [name] today, 9/16, though was unable to reach.  Patient appears in no acute distress.    Labs/Studies:  Labs and Studies from the last 24hrs per EMR and Reviewed    Objective:  Temp:  [36.4 ??C (97.5 ??F)-37 ??C (98.6 ??F)] 36.7 ??C (98.1 ??F)  Heart Rate:  [82-88] 85  Resp:  [16-18] 16  BP: (120-141)/(74-87) 141/85  SpO2:  [98 %-100 %] 100 %    General: awake, alert. No acute distress.  Lying in bed watching television.  Skin: Warm, dry, and intact.  Head: Normocephalic.  Eyes: Conjunctivae clear without exudates or hemorrhage. Eyelids normal in appearance without swelling or lesions.  Cardiac: Heart rate and rhythm normal, no edema in extremities, 2+ radial pulses bilaterally  Psychiatric: appropriate mood and affect  Extremities: 2+ nonpitting edema of BLE with chronic venous stasis related skin changes.  Noted small ulcerations on BLE without active drainage.  No warmth to touch of BLE.  Neurological: Awake, alert with normal speech.  No gross neurological deficits.  GU: Deferred  GI: Abdomen soft and symmetric with moderate distention, bowel sounds present and normoactive in all four quadrants. No wincing or grimacing with palpation.  Respiratory: chest wall symmetric and without deformity, no respiratory distress, lung sounds CTA.

## 2022-02-12 NOTE — Unmapped (Signed)
Problem: Adult Inpatient Plan of Care  Goal: Plan of Care Review  Outcome: Progressing  Flowsheets (Taken 02/12/2022 0445)  Progress: improving  Plan of Care Reviewed With: patient  Goal: Absence of Hospital-Acquired Illness or Injury  Intervention: Identify and Manage Fall Risk  Recent Flowsheet Documentation  Taken 02/12/2022 0445 by Clemens Catholic, RN  Safety Interventions:   fall reduction program maintained   lighting adjusted for tasks/safety   low bed   nonskid shoes/slippers when out of bed  Intervention: Prevent and Manage VTE (Venous Thromboembolism) Risk  Recent Flowsheet Documentation  Taken 02/12/2022 0445 by Clemens Catholic, RN  Activity Management:   activity adjusted per tolerance   activity encouraged  Goal: Optimal Comfort and Wellbeing  Intervention: Monitor Pain and Promote Comfort  Recent Flowsheet Documentation  Taken 02/12/2022 0445 by Clemens Catholic, RN  Pain Management Interventions: awakened for pain meds per patient request     Problem: Impaired Wound Healing  Goal: Optimal Wound Healing  Outcome: Progressing  Intervention: Promote Wound Healing  Flowsheets (Taken 02/12/2022 0445)  Activity Management:   activity adjusted per tolerance   activity encouraged  Pain Management Interventions: awakened for pain meds per patient request  Sleep/Rest Enhancement:   awakenings minimized   noise level reduced   room darkened     Problem: Fall Injury Risk  Goal: Absence of Fall and Fall-Related Injury  Outcome: Progressing  Intervention: Identify and Manage Contributors  Flowsheets (Taken 02/12/2022 0445)  Medication Review/Management:   medications reviewed   high-risk medications identified  Self-Care Promotion: independence encouraged  Intervention: Promote Injury-Free Environment  Flowsheets (Taken 02/12/2022 0445)  Safety Interventions:   fall reduction program maintained   lighting adjusted for tasks/safety   low bed   nonskid shoes/slippers when out of bed     Problem: Self-Care Deficit  Goal: Improved Ability to Complete Activities of Daily Living  Outcome: Progressing  Intervention: Promote Activity and Functional Independence  Flowsheets (Taken 02/12/2022 0445)  Activity Assistance Provided: assistance, 1 person  Self-Care Promotion: independence encouraged

## 2022-02-13 LAB — BASIC METABOLIC PANEL
ANION GAP: 10 mmol/L (ref 5–14)
BLOOD UREA NITROGEN: 28 mg/dL — ABNORMAL HIGH (ref 9–23)
BUN / CREAT RATIO: 17
CALCIUM: 8.9 mg/dL (ref 8.7–10.4)
CHLORIDE: 110 mmol/L — ABNORMAL HIGH (ref 98–107)
CO2: 21 mmol/L (ref 20.0–31.0)
CREATININE: 1.61 mg/dL — ABNORMAL HIGH
EGFR CKD-EPI (2021) MALE: 45 mL/min/{1.73_m2} — ABNORMAL LOW (ref >=60)
GLUCOSE RANDOM: 112 mg/dL (ref 70–179)
POTASSIUM: 3.3 mmol/L — ABNORMAL LOW (ref 3.4–4.8)
SODIUM: 141 mmol/L (ref 135–145)

## 2022-02-13 LAB — MAGNESIUM: MAGNESIUM: 1.7 mg/dL (ref 1.6–2.6)

## 2022-02-13 MED ADMIN — apixaban (ELIQUIS) tablet 5 mg: 5 mg | ORAL

## 2022-02-13 MED ADMIN — ammonium lactate (LAC-HYDRIN) 12 % lotion 1 application.: 1 | TOPICAL | @ 13:00:00 | Stop: 2022-02-13

## 2022-02-13 MED ADMIN — oxyCODONE (ROXICODONE) immediate release tablet 5 mg: 5 mg | ORAL | Stop: 2022-02-23

## 2022-02-13 MED ADMIN — potassium chloride ER tablet 40 mEq: 40 meq | ORAL | @ 13:00:00 | Stop: 2022-02-13

## 2022-02-13 MED ADMIN — apixaban (ELIQUIS) tablet 5 mg: 5 mg | ORAL | @ 13:00:00 | Stop: 2022-02-13

## 2022-02-13 MED ADMIN — hydroCHLOROthiazide (HYDRODIURIL) tablet 25 mg: 25 mg | ORAL | @ 13:00:00 | Stop: 2022-02-13

## 2022-02-13 MED ADMIN — ammonium lactate (LAC-HYDRIN) 12 % lotion 1 application.: 1 | TOPICAL

## 2022-02-13 MED ADMIN — carvediloL (COREG) tablet 6.25 mg: 6.25 mg | ORAL

## 2022-02-13 MED ADMIN — oxyCODONE (ROXICODONE) immediate release tablet 5 mg: 5 mg | ORAL | @ 16:00:00 | Stop: 2022-02-13

## 2022-02-13 MED ADMIN — melatonin tablet 3 mg: 3 mg | ORAL

## 2022-02-13 MED ADMIN — senna (SENOKOT) tablet 1 tablet: 1 | ORAL | @ 13:00:00 | Stop: 2022-02-13

## 2022-02-13 MED ADMIN — carvediloL (COREG) tablet 6.25 mg: 6.25 mg | ORAL | @ 13:00:00 | Stop: 2022-02-13

## 2022-02-13 NOTE — Unmapped (Signed)
Physician Discharge Summary [facility]  [address] [facility]  [address]  [place] [address]  Dept: [phone]  Loc: [phone]     Identifying Information:   Adam Hoover  27-Jul-1945  [id number]    Primary Care Physician: Mills Koller, MD     Code Status: Full Code    Admit Date: 02/09/2022    Discharge Date: 02/13/2022     Discharge To: Home with Home Health and/or PT/OT    Discharge Service: [facility] - Hospitalist [place] APP     Discharge Attending Physician: Jeanene Erb, FNP    Discharge Diagnoses:  Principal Problem:    Venous stasis dermatitis of both lower extremities (POA: Yes)  Active Problems:    History of pulmonary embolism, 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 *      Outpatient Provider Follow Up Issues:   [ ]  Referral sent to outpatient lymphedema clinic at discharge  [ ]  Intermittently monitor BMP and magnesium level while on diuretic therapy  [ ]  Consider standing potassium supplementation while on diuretic therapy, though caution with CKD    Hospital Course:   Adam Hoover is a 76 y.o. male who presented to [facility] with Venous stasis dermatitis of both lower extremities.     Worsening venous stasis dermatitis: Presented from clinic with worsening lower extremity edema and increased warmth, tenderness, drainage from chronic lower extremity venous stasis ulcers.  No evidence of systemic infection normal WBCs and no fevers.  Wounds did not appear obviously infected and no definitive cellulitis although difficult to be certain given increased warmth and worsening pain.  Patient was initially started on IV antibiotic therapy with vancomycin and aztreonam.  WOCN was consulted.  Secondary to low suspicion of infection, antibiotics were subsequently discontinued.  Patient was given a one-time dose of IV Lasix for assistance with lower extremity edema.  WOCN recommended cleansing with Vashe twice daily followed by ammonium lactate.  Patient noncompliant with compression therapy while hospitalized.  Referral was sent to lymphedema clinic at discharge for ongoing management.  Patient advised to continue compression therapy and elevation of BLE as an outpatient.  Patient strongly advised to call his PCP to arrange for close outpatient follow-up for ongoing monitoring, however, informed writer he would likely not proceed with doing this, and would prefer to just keep his December 20th appointment at this time.      Hypokalemia, hypomagnesemia: Likely secondary to diuretic therapy.  Replaced with PO and IV supplementation while hospitalized.  Consider standing potassium supplementation while on diuretic therapy, though caution with CKD.    Metastatic hormone sensitive prostate cancer: Follows with Dr. Philomena Course and Barkley Bruns, NP in addition to palliative care.  Most recent PSA stable.  He did not have his home darolutamide with him. CT abdomen on admission showed right kidney lower pole intermediate attenuation lesion, which demonstrating internal septations and hepatic segment VIII lesion, measuring up to 1.5 cm further evaluation with MRI was recommended. Discussed with patient's outpatient primary oncologist who recommended MRI imaging for further evaluation.  MRI showed previously described lesion on right kidney is likely a septated cyst, and hepatic lesions seen on CT imaging indeterminate,  with recommendation for close interval follow-up.  Continued home oxycodone q6 PRN. Has outpatient follow-up scheduled for 05/09/22.     CKD: Baseline creatinine around 1.6.  Slightly up from baseline on admission at 2.3 but resolved on recheck.      Hypertension: Continued home Coreg, HCTZ.     History of PE: Continued home Eliquis.     Chronic colonic dilation:  Present on scans dating back to 2020 and again seen on CT A/P in the ED with slight increase compared to March 2023 scan.  Continued passing flatus and having bowel movements without significant GI symptoms.  Abdomen quite distended on exam but remain soft and nontender.    Deconditioning: Related to age, lower extremity wounds, metastatic prostate cancer, pain, and obesity with BMI greater than 30.  He was evaluated by PT/OT who recommended 5 times low weekly, however patient declined discharge to SNF.  Case management was consulted, and patient discharged home with home health/PT/OT.      Procedures:  No admission procedures for hospital encounter.  ______________________________________________________________________  Discharge Medications:     Your Medication List        START taking these medications      ammonium lactate 12 % lotion  Commonly known as: LAC-HYDRIN  Apply 1 application. topically Two (2) times a day.            CONTINUE taking these medications      carvediloL 3.125 MG tablet  Commonly known as: COREG  Take 2 tablets (6.25 mg total) by mouth Two (2) times a day.     darolutamide 300 mg tablet  Commonly known as: NUBEQA  Take 2 tablets (600 mg total) by mouth in the morning and 2 tablets (600 mg total) in the evening. Take with meals. Take with food. Swallow tablets whole.Marland Kitchen     ELIQUIS 5 mg Tab  Generic drug: apixaban  Take 1 tablet by mouth two (2) times a day.     FeroSuL 325 (65 FE) MG tablet  Generic drug: ferrous sulfate  Take 1 tablet (325 mg total) by mouth every other day.     hydroCHLOROthiazide 25 MG tablet  Commonly known as: HYDRODIURIL  Take 1 tablet (25 mg total) by mouth daily.     naloxone 4 mg/actuation nasal spray  Commonly known as: NARCAN  One spray in either nostril once for known/suspected opioid overdose. May repeat every 2-3 minutes in alternating nostril til EMS arrives     oxyCODONE 5 MG immediate release tablet  Commonly known as: ROXICODONE  Take one-half to (1) one (2.5-5 mg total) by mouth every eight (8) hours as needed for pain.              Allergies:  Acetaminophen, Penicillins, and Lisinopril  ______________________________________________________________________  Pending Test Results (if blank, then none):      Most Recent Labs:  All lab results last 24 hours -   Recent Results (from the past 24 hour(s))   Basic Metabolic Panel    Collection Time: 02/13/22  4:04 AM   Result Value Ref Range    Sodium 141 135 - 145 mmol/L    Potassium 3.3 (L) 3.4 - 4.8 mmol/L    Chloride 110 (H) 98 - 107 mmol/L    CO2 21.0 20.0 - 31.0 mmol/L    Anion Gap 10 5 - 14 mmol/L    BUN 28 (H) 9 - 23 mg/dL    Creatinine 3.66 (H) 0.60 - 1.10 mg/dL  BUN/Creatinine Ratio 17     eGFR CKD-EPI (2021) Male 45 (L) >=60 mL/min/1.1m2    Glucose 112 70 - 179 mg/dL    Calcium 8.9 8.7 - 16.1 mg/dL   Magnesium Level    Collection Time: 02/13/22  4:04 AM   Result Value Ref Range    Magnesium 1.7 1.6 - 2.6 mg/dL       Relevant Studies/Radiology (if blank, then none):  MRI Abdomen W Wo Contrast    Result Date: 02/12/2022  EXAM: MRI abdomen with and without contrast DATE: 02/12/2022 10:42 AM ACCESSION: [id number] UN DICTATED: 02/12/2022 11:58 AM INTERPRETATION LOCATION: [facility] CLINICAL INDICATION: 76 years old Male with history of prostate cancer, with concerns for metastasis on abdominal imaging upon admission.  COMPARISON: CT of the abdomen and pelvis with contrast dated 02/09/2022. TECHNIQUE: MRI of the abdomen was obtained with and without IV contrast. Multisequence, multiplanar images were obtained.    CONTRAST: 10 mL of  Multihance FINDINGS: This examination is significantly compromised by arm positioning, patient motion and marked colonic gaseous distention causing significant artifacts. LOWER CHEST: Mild right lower lung atelectasis. HEPATOBILIARY: Multiple T2 hyperintense lesions scattered throughout the liver measuring up to 1.4 cm without evidence of enhancement consistent with simple hepatic cysts. In the anterior aspect of segment VIII (13:15), there is a T2 hyperintense lesion, although less intense than the adjacent segment VIII hepatic cyst. This corresponds to the lesion described on recent CT. This lesion demonstrates T1 signal hypointensity but no definite restricted diffusion (there is T2 shine through (9:96; 10:13). Postcontrast images are limited by significant motion and artifact from extensive gaseous distention of the colon. The lesion is difficult to delineate on arterial phase imaging, but appears to enhance homogeneously on subtraction series 1022:38 lesion is not well delineated on delayed venous sequences. There is no intrahepatic biliary ductal dilation. Layering sludge is present within the gallbladder. PANCREAS: Obscured by artifact. SPLEEN: Normal size. ADRENAL GLANDS: Nodular thickening of the left greater than right adrenal gland. KIDNEYS/URETERS: Lobulated, septated cyst at the anterior medial right lower pole measuring 3.4 cm (5:33) this corresponds to lesion described on CT from 02/09/2022. Lesion demonstrates T2 signal hyperintensity and T1 hypointensity without enhancement. Multiple additional simple and hemorrhagic/proteinaceous cysts are present bilaterally. No hydronephrosis. BOWEL/PERITONEUM/RETROPERITONEUM: Markedly dilated colon measuring up to 14 cm is better evaluated on recent CT in largely unchanged. Markedly dilated colon obscures small bowel. VASCULATURE: Hepatic and portal veins appear patent. Normal caliber aorta. LYMPH NODES: No pathologic adenopathy. BONES/SOFT TISSUES: Innumerable T1 hypointense lesions throughout the thoracolumbar spine with varying degrees of enhancement consistent with known metastatic osseous disease.     --Technically compromised examination due to multiple factors as above. --Previously described lesion in segment VIII is difficult to characterize due to significant artifacts. It appears T2 hyperintense with enhancement noted on subtraction arterial phase imaging, which suggests capillary hemangioma, although the lesion is not well delineated on venous phase imaging, and there is suggestion of signal suppression on out of phase chemical shift sequences. Therefore, this lesion remains indeterminate, and continued close interval follow-up is recommended. --Septated cyst at the lower pole of the right kidney corresponds to lesion described on recent CT. No solid component or enhancement. --Markedly dilated colon measuring up to 14 cm in diameter, similar to prior --Diffuse sclerotic osseous metastases.    ED POCUS Chest Bilateral    Result Date: 02/12/2022  Limited Cardiac Ultrasound (CPT 19147-82) Indication: A focused ultrasound exam of the heart was performed to evaluate for pericardial  effusion, tamponade, severe hypovolemia, or gross abnormalities of cardiac anatomy or function in this patient. The ultrasound was performed with the following indications, as noted in the H&P: Other indications as noted in the H&P Identified structures: The pericardial sac, myocardium, and 4 chambers were identified using the following views: subxiphoid, parasternal long axis, parasternal short axis, and apical 4-chamber Findings: Exam of the above structures revealed the following findings:  Pericardial effusion: Absent   Pericardial tamponade: N/A  Global LV function: Normal  Right ventricular size: Normal   Signs of RV strain: N/A  IVC: Normal  Limitations: None. Impression: No sonographic evidence of significant cardiac dysfunction, No sonographic evidence of significant pericardial effusion, and Normal RV Interpreted by: Peggyann Juba, MD Quality Assurance  After review of the point-of-care ultrasound performed in this case I assess the overall image quality as: Image quality: Minimal criteria met for diagnosis, recognizable structures but with some technical or other flaws  The accuracy of interpretation of images as presented reflects a true negative. This study does  meet minimum criteria for credentialing and billing. Katherina Mires, MD     ECG 12 Lead    Result Date: 02/10/2022  NORMAL SINUS RHYTHM NORMAL ECG WHEN COMPARED WITH ECG OF 30-Sep-2020 22:22, NO SIGNIFICANT CHANGE WAS FOUND Confirmed by Mariane Baumgarten ([id number]) on 02/10/2022 4:34:07 PM    CT Abdomen Pelvis Wo Contrast    Result Date: 02/09/2022  EXAM: CT ABDOMEN PELVIS WO CONTRAST DATE: 02/09/2022 4:31 PM ACCESSION: [id number] UN DICTATED: 02/09/2022 4:44 PM INTERPRETATION LOCATION: [facility] CLINICAL INDICATION: 76 years old with abdominal distention ; Bowel obstruction suspected, COMPARISON: CT abdomen pelvis 08/10/2021 TECHNIQUE: A spiral CT scan was obtained without IV contrast from the lung bases to the pubic symphysis.  Images were reconstructed in the axial plane. Coronal and sagittal reformatted images were also provided for further evaluation. Evaluation of the solid organs and vasculature is limited in the absence of intravenous contrast. FINDINGS: LOWER CHEST: Right basilar atelectasis versus scarring. No focal consolidation, pleural effusion, or pericardial effusion. LIVER: Normal liver contour. Several hypoattenuating lesions, measuring up to 1.9 cm in, similar to prior. Several additional subcentimeter hypoattenuating lesions too small to characterize. Intermediate attenuation lesion within hepatic segment VIII, measuring up to 1.5 cm (2:43), within the area previously described hyperenhancing focus. BILIARY: The gallbladder is normal in appearance with dependent hyperattenuating material, likely biliary sludge. No intrahepatic biliary ductal dilatation. SPLEEN: Redemonstrated nodular thickening of the left greater than right adrenal glands. PANCREAS: Normal pancreatic contour without signs of inflammation or gross ductal dilatation. ADRENAL GLANDS: Normal appearance of the adrenal glands. KIDNEYS/URETERS: Similar bilateral mild perinephric fat stranding. Several bilateral hypoattenuating lesions, measuring up to 5.3 cm in the left interpolar kidney. Small slightly hyperattenuating left interpolar kidney lesion, measuring up to 1.4 cm (2:70), likely reflecting hemorrhagic versus proteinaceous cyst. Redemonstrated irregular hypoattenuating lesion in the right kidney inferior pole, measuring up to 3.4 cm, previously 2.9 cm. This lesion demonstrated possible internal septations on 08/10/2021. No nephrolithiasis.  No ureteral dilatation or collecting system distention. BLADDER: Circumferential wall thickening, likely related related to incomplete distention. REPRODUCTIVE ORGANS: Normal prostate. GI TRACT: Small sliding-type hiatal hernia. Normal course of the stomach and duodenum. Markedly dilated colon, extending from the cecum through the proximal descending colon with gradual tapering with the colon measuring up to 16.1 cm, previously 15.3 cm. No evidence of volvulus or definitive obstructing mass. In the absence of intravenous contrast, limited evaluation for signs of ischemia. Normal appendix. PERITONEUM/RETROPERITONEUM AND  MESENTERY: No free air. No ascites. No fluid collection. VASCULATURE: Normal caliber aorta with mild calcified atherosclerosis. Otherwise, limited evaluation without contrast. LYMPH NODES: Numerous subcentimeter retroperitoneal/periaortic lymph nodes, but no lymphadenopathy. BONES and SOFT TISSUES: Similar caliber and distribution of numerous sclerotic metastases with diffuse sclerotic changes of the pelvis. For example: -Posterior L2 vertebral body, measuring up to 3.3 cm, previously 3.2 cm -Anterior L5 vertebral body, measuring 2.1 m, previously 2.1 cm. Multilevel degenerative changes of the spine with grade 1 anterolisthesis of L4 on L5. Small fat-containing umbilical hernia. Moderate bilateral fat-containing inguinal hernias.     --Slightly increased caliber of diffuse colonic dilatation, measuring up to 16.1 cm, previously 15.3 on 08/10/2021, to the level of the proximal descending colon. Dating back to 10/01/2020 and 04/02/2019, there appears to be progressively increased gaseous colonic dilatation. Findings can be seen with chronic ileus. Colonoscopy can be considered if not already performed. 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 --Right kidney lower pole intermediate attenuation lesion, which demonstrating internal septations and CT abdomen pelvis 08/10/2021 and demonstrates slight interval increase in size. Recommend nonemergent, outpatient contrast enhanced MRI for further evaluation is not previously performed. --Intermediate attenuation hepatic segment VIII lesion, measuring up to 1.5 cm, which is nonspecific, but could be further evaluated on MRI as above. --Additional chronic and incidental findings as characterized in the body of the report.     XR Abdomen 2 Views    Result Date: 02/09/2022  EXAM: XR ABDOMEN 2 VIEWS DATE: 02/09/2022 12:26 PM ACCESSION: [id number] UN DICTATED: 02/09/2022 1:06 PM INTERPRETATION LOCATION: [facility] CLINICAL INDICATION: 76 years old Male with ABDOMINAL PAIN  -  UNSPECIFIED SITE  COMPARISON: 08/10/2021 CT abdomen and  pelvis TECHNIQUE: Supine and upright views of the abdomen. FINDINGS and IMPRESSION: Limited assessment of the lung bases due to limited field of view. No evidence of pneumoperitoneum. Overall unchanged appearance of markedly and diffusely dilated loops of colon is again identified. This finding is not specific and could be secondary to paralytic ileus of the colon/pseudoobstruction since this finding is also noted on the prior CT from 08/10/2021. Assessment of small bowel loops is limited in the presence of dilated colonic loops but visualized small bowel loops are nondilated. Surgical clips overlying the left inferior pubic bone and adjacent soft tissues. Diffuse sclerotic metastatic disease is again identified.     XR Chest 2 views    Result Date: 02/09/2022  EXAM: XR CHEST 2 VIEWS DATE: 02/09/2022 12:07 PM ACCESSION: [id number] UN DICTATED: 02/09/2022 12:41 PM INTERPRETATION LOCATION: [facility] CLINICAL INDICATION: 76 years old Male with DYSPNEA  TECHNIQUE: AP and Lateral Chest Radiographs. COMPARISON: 08/10/2021 FINDINGS: Lungs are low in volume with bibasilar bronchovascular crowding. No focal consolidation. No pleural effusion or pneumothorax. Cardiac silhouette is normal in size.  Persistent marked gaseous distention of colon. Diffuse osteoblastic skeletal metastatic disease appears unchanged.     No acute cardiopulmonary abnormality.   ______________________________________________________________________  Discharge Instructions:     Diet Instructions       Discharge diet (specify)      Discharge Nutrition Therapy: Heart Healthy            Follow Up instructions and Outpatient Referrals     Ambulatory referral to Home Health      Is this a [facility] or [facility] Patient?: No    Physician to follow patient's care: PCP    Disciplines requested:  Physical Therapy  Occupational Therapy       Physical Therapy  requested:  Home safety evaluation  Evaluate and treat       Occupational Therapy Requested:  Home safety evaluation  Evaluate and treat       Requested SOC Date:  Comment - within 48 hours of discharge    Do you want ongoing co-management?: Yes    Care coordination required?: No    Ambulatory referral to Lymphedema Clinic      Call MD for:  difficulty breathing, headache or visual disturbances      Call MD for:  extreme fatigue      Call MD for:  redness, tenderness, or signs of infection (pain, swelling,   redness, odor or green/yellow discharge around incision site)      Call MD for:  severe uncontrolled pain      Call MD for: Temperature > 38.5 Celsius ( > 101.3 Fahrenheit)      Discharge instructions          Other Instructions       Call MD for:  difficulty breathing, headache or visual disturbances      Call MD for:  extreme fatigue Call MD for:  redness, tenderness, or signs of infection (pain, swelling, redness, odor or green/yellow discharge around incision site)      Call MD for:  severe uncontrolled pain      Call MD for: Temperature > 38.5 Celsius ( > 101.3 Fahrenheit)      Discharge instructions      You have been referred to the lymphedema clinic as an outpatient for ongoing management of the swelling in your legs.  They will be calling you next week to schedule an initial appointment.  If you do not hear from them within the next week, please call [phone] to follow-up.  Please keep your legs elevated as much as tolerable.  Continue to wear your compression wraps as much as tolerable.  Please keep the skin clean by using the Vashe wash that you were provided at discharge twice daily.  After the Vashe runs out, you may use Dial soap to keep your legs clean.  It is also important to keep your legs moisturized.  If your legs are dry and cracked, this increases your risk of infection.  After you wash your legs twice daily, ensure that you moisturize after with the ammonium lactate that you were provided at discharge. This can also be obtained over the counter at your local grocery or drugstore. Please monitor your sodium intake, as excessive sodium can also worsen your swelling.  Please return to the hospital for shortness of breath, chest pain, worsening swelling of your lower extremities, redness or warmth of your lower extremities, sweats, chills, or fevers.      How to manage lymphedema  Know the symptoms  Learn to recognize symptoms of lymphedema so that you can get treatment right away. Symptoms include:   Feeling as though your clothes, rings, or other jewelry are too tight.   A feeling of fullness in your arm or leg.   Less flexibility in your wrist, hand, or ankle.   Heaviness and swelling of the chest area where the breast was removed.  Keep lymph fluid moving  Do all you can to help keep the lymph fluid moving so that it doesn't collect in your arm or leg.   Prop up your arm or leg on a pillow anytime you sit or lie down. Try to keep the limb above the level of your heart whenever you can.  Try to limit the use of a blood pressure cuff on your affected arm. If you are in the hospital, make sure that your nurse and other hospital staff know about your condition.   If your leg is affected, try not to cross your legs when you sit. Don't sit in one position longer than 30 minutes.   Keep your clothing loose around the limb that is affected. For example, don't wear shirts with elastic cuffs. Wear the right size panty hose and stockings. Don't wear garters or knee-high or thigh-high stockings.   Don't use heating pads on the area. And stay out of saunas and hot tubs. Heat may increase the blood flow and make swelling worse.   Follow your doctor's advice about what daily exercises you should do. Exercises can help drain the lymph fluid.   See a physical therapist. He or she can teach you how to do special massages that can help move fluid out of your arm or leg. You also can learn what activities would be best for you.  Protect your arm or leg  Do all you can to protect your arm or leg from injury and infection.   Ask your doctor how to treat any cuts, scratches, insect bites, or other injuries that you may get.   Use sunscreen and insect repellent to protect your skin from sunburn and insect bites.   Protect your arm or leg from needle injections-no blood draws or shots, including chemotherapy. If you are in the hospital, make sure that your nurse and other hospital staff know about your condition.   Keep your feet clean, and wear clean socks or stockings every day.   Don't walk barefoot, especially outside.   Check your feet often for cuts, blisters, or signs of infection.   Take good care of your skin and nails. Use a mild soap that has a moisturizer, or use a moisturizer separately. Skin that is dry and cracked can get infected. Be careful when you clip your nails. Don't cut your cuticles.   Use an electric razor if you shave an arm or leg that is affected.   Call your doctor at the first sign of a rash or inflammation on your arm or leg.   Follow your doctor's advice about wearing a special bandage or compression garment. These specially fitted stockings or sleeves are designed to help keep fluid from pooling in the leg or arm.            Appointments which have been scheduled for you      May 09, 2022  8:30 AM  (Arrive by 8:00 AM)  LAB ONLY [place] with ADULT ONC LAB  [facility] ADULT ONCOLOGY LAB DRAW STATION [facility] REGION) [address]  [place] [address]  [phone]        May 09, 2022  9:30 AM  (Arrive by 9:00 AM)  RETURN ACTIVE [place] with Chancy Hurter, ANP  [place] ONCOLOGY MULTIDISCIPLINARY 2ND FLR CANCER HOSP [facility] REGION) [address]  [place] [address]  [phone]        May 09, 2022 10:30 AM  (Arrive by 10:00 AM)  Danelle Earthly NURSE with [facility] NURSE MULTI  [place] ONCOLOGY MULTIDISCIPLINARY 2ND FLR CANCER HOSP [facility] REGION) [address]  [place] [address]  [phone]        May 18, 2022 10:00 AM  (Arrive by 9:45 AM)  RETURN ANTICOAG with Scot Jun  Lorin Picket, [place]  [facility] INTERNAL MEDICINE [place]  [facility] REGION) [address]  [place] [address]  [phone]             ______________________________________________________________________  Discharge Day Services:  BP 102/66  - Pulse 87  - Temp 36.6 ??C (97.9 ??F) (Oral)  - Resp 18  - Ht 180.3 cm (5' 11)  - Wt (!) 116.1 kg (256 lb)  - SpO2 96%  - BMI 35.70 kg/m??   Pt seen on the day of discharge and determined appropriate for discharge.    Condition at Discharge: fair    Length of Discharge: I spent greater than 30 mins in the discharge of this patient.

## 2022-02-13 NOTE — Unmapped (Signed)
Problem: Adult Inpatient Plan of Care  Goal: Plan of Care Review  Outcome: Progressing  Goal: Patient-Specific Goal (Individualized)  Outcome: Progressing  Goal: Absence of Hospital-Acquired Illness or Injury  Outcome: Progressing  Intervention: Identify and Manage Fall Risk  Recent Flowsheet Documentation  Taken 02/13/2022 0800 by Lexxus Underhill, RN  Safety Interventions:   fall reduction program maintained   low bed  Intervention: Prevent Skin Injury  Recent Flowsheet Documentation  Taken 02/13/2022 0900 by Riordan Walle, RN  Skin Protection: adhesive use limited  Taken 02/13/2022 0800 by Rilen Shukla, RN  Skin Protection: adhesive use limited  Intervention: Prevent and Manage VTE (Venous Thromboembolism) Risk  Recent Flowsheet Documentation  Taken 02/13/2022 0800 by Araeya Lamb, RN  Activity Management: activity adjusted per tolerance  Goal: Optimal Comfort and Wellbeing  Outcome: Progressing  Goal: Readiness for Transition of Care  Outcome: Progressing  Goal: Rounds/Family Conference  Outcome: Progressing     Problem: Impaired Wound Healing  Goal: Optimal Wound Healing  Outcome: Progressing  Intervention: Promote Wound Healing  Recent Flowsheet Documentation  Taken 02/13/2022 0800 by Arrion Broaddus, RN  Activity Management: activity adjusted per tolerance     Problem: Fall Injury Risk  Goal: Absence of Fall and Fall-Related Injury  Outcome: Progressing  Intervention: Promote Injury-Free Environment  Recent Flowsheet Documentation  Taken 02/13/2022 0800 by Grayce Budden, RN  Safety Interventions:   fall reduction program maintained   low bed     Problem: Self-Care Deficit  Goal: Improved Ability to Complete Activities of Daily Living  Outcome: Progressing

## 2022-02-13 NOTE — Unmapped (Signed)
Pt VSS, pain controlled with PRN pain medication.Free from falls during shift.  Call bell in reach, HOB elevated, weight shifting encouraged o    Neuro Ax4 HOH   RESP: RMA productive cough  Voiding see I&O's  Skin see Wound LDA  Wound care performed per orders  Plan of care ongoing   Problem: Adult Inpatient Plan of Care  Goal: Plan of Care Review  Outcome: Progressing  Flowsheets (Taken 02/13/2022 0230)  Progress: improving  Plan of Care Reviewed With: patient  Goal: Absence of Hospital-Acquired Illness or Injury  Intervention: Identify and Manage Fall Risk  Recent Flowsheet Documentation  Taken 02/13/2022 0230 by Clemens Catholic, RN  Safety Interventions:   fall reduction program maintained   nonskid shoes/slippers when out of bed   room near unit station  Intervention: Prevent and Manage VTE (Venous Thromboembolism) Risk  Recent Flowsheet Documentation  Taken 02/13/2022 0230 by Clemens Catholic, RN  Activity Management:   activity adjusted per tolerance   dorsiflexion/plantar flexion performed  Goal: Optimal Comfort and Wellbeing  Intervention: Monitor Pain and Promote Comfort  Recent Flowsheet Documentation  Taken 02/13/2022 0230 by Clemens Catholic, RN  Pain Management Interventions:   care clustered   medication offered   pain management plan reviewed with patient/caregiver     Problem: Impaired Wound Healing  Goal: Optimal Wound Healing  Outcome: Progressing  Intervention: Promote Wound Healing  Flowsheets (Taken 02/13/2022 0230)  Activity Management:   activity adjusted per tolerance   dorsiflexion/plantar flexion performed  Pain Management Interventions:   care clustered   medication offered   pain management plan reviewed with patient/caregiver  Sleep/Rest Enhancement:   awakenings minimized   room darkened   relaxation techniques promoted     Problem: Fall Injury Risk  Goal: Absence of Fall and Fall-Related Injury  Outcome: Progressing  Intervention: Identify and Manage Contributors  Flowsheets (Taken 02/13/2022 0230)  Medication Review/Management: medications reviewed  Self-Care Promotion: independence encouraged  Intervention: Promote Injury-Free Environment  Flowsheets (Taken 02/13/2022 0230)  Safety Interventions:   fall reduction program maintained   nonskid shoes/slippers when out of bed   room near unit station     Problem: Self-Care Deficit  Goal: Improved Ability to Complete Activities of Daily Living  Outcome: Progressing  Intervention: Promote Activity and Functional Independence  Flowsheets (Taken 02/13/2022 0230)  Activity Assistance Provided: assistance, 1 person  Self-Care Promotion: independence encouraged

## 2022-02-13 NOTE — Unmapped (Signed)
Problem: Adult Inpatient Plan of Care  Goal: Plan of Care Review  Outcome: Progressing  Goal: Patient-Specific Goal (Individualized)  Outcome: Progressing  Goal: Absence of Hospital-Acquired Illness or Injury  Outcome: Progressing  Intervention: Identify and Manage Fall Risk  Recent Flowsheet Documentation  Taken 02/13/2022 0800 by Hilbert Corrigan, RN  Safety Interventions:   fall reduction program maintained   low bed  Intervention: Prevent Skin Injury  Recent Flowsheet Documentation  Taken 02/13/2022 0900 by Hilbert Corrigan, RN  Skin Protection: adhesive use limited  Taken 02/13/2022 0800 by Hilbert Corrigan, RN  Skin Protection: adhesive use limited  Intervention: Prevent and Manage VTE (Venous Thromboembolism) Risk  Recent Flowsheet Documentation  Taken 02/13/2022 0800 by Hilbert Corrigan, RN  Activity Management: activity adjusted per tolerance  Goal: Optimal Comfort and Wellbeing  Outcome: Progressing  Goal: Readiness for Transition of Care  Outcome: Progressing  Goal: Rounds/Family Conference  Outcome: Progressing     Problem: Impaired Wound Healing  Goal: Optimal Wound Healing  Outcome: Progressing  Intervention: Promote Wound Healing  Recent Flowsheet Documentation  Taken 02/13/2022 0800 by Hilbert Corrigan, RN  Activity Management: activity adjusted per tolerance     Problem: Fall Injury Risk  Goal: Absence of Fall and Fall-Related Injury  Outcome: Progressing  Intervention: Promote Injury-Free Environment  Recent Flowsheet Documentation  Taken 02/13/2022 0800 by Hilbert Corrigan, RN  Safety Interventions:   fall reduction program maintained   low bed     Problem: Self-Care Deficit  Goal: Improved Ability to Complete Activities of Daily Living  Outcome: Progressing

## 2022-02-14 DIAGNOSIS — Z09 Encounter for follow-up examination after completed treatment for conditions other than malignant neoplasm: Principal | ICD-10-CM

## 2022-02-15 NOTE — Unmapped (Signed)
The PAC has received an incoming call regarding a request for a verbal order  ROUTE TO PROVIDER, NOT NURSING POOL    Type of order requested (PT, OT, Speech, Home Health Nurse): physical therapy  Length of service: 8 weeks    Frequency of service: 1 time every 2 weeks   Other instructions or special requests from caller: requesting nursing evaluation  for wound care     Return call to:     Name: [facility]  Phone number: [phone]

## 2022-02-15 NOTE — Unmapped (Signed)
[facility] Refill Coordination Note    Specialty Medication(s) to be Shipped:   Hematology/Oncology: Nubeqa    Other medication(s) to be shipped: No additional medications requested for fill at this time     Adam Hoover, DOB: 01/22/1946  Phone: [phone] (home)       All above HIPAA information was verified with patient's family member, Adam Hoover.     Was a Nurse, learning disability used for this call? No    Completed refill call assessment today to schedule patient's medication shipment from the [facility] [phone]).  All relevant notes have been reviewed.     Specialty medication(s) and dose(s) confirmed: Regimen is correct and unchanged.   Changes to medications: Adam Hoover reports no changes at this time.  Changes to insurance: No  New side effects reported not previously addressed with a pharmacist or physician: None reported  Questions for the pharmacist: No    Confirmed patient received a Conservation officer, historic buildings and a Surveyor, mining with first shipment. The patient will receive a drug information handout for each medication shipped and additional FDA Medication Guides as required.       DISEASE/MEDICATION-SPECIFIC INFORMATION        N/A    SPECIALTY MEDICATION ADHERENCE     Medication Adherence    Patient reported X missed doses in the last month: 0  Specialty Medication: Nubeqa 300mg   Patient is on additional specialty medications: No  Informant: Adam Hoover/children                       Were doses missed due to medication being on hold? No    Nubeqa 300mg : 10 days of medicine on hand        REFERRAL TO PHARMACIST     Referral to the pharmacist: Not needed      [facility]     Shipping address confirmed in Epic.     Delivery Scheduled: Yes, Expected medication delivery date: 02/22/22.     Medication will be delivered via Next Day Courier to the prescription address in Epic [place].    Adam Hoover   [facility] Specialty Technician

## 2022-02-18 DIAGNOSIS — R54 Age-related physical debility: Principal | ICD-10-CM

## 2022-02-23 ENCOUNTER — Ambulatory Visit: Admit: 2022-02-23 | Discharge: 2022-02-24 | Payer: MEDICARE

## 2022-02-23 DIAGNOSIS — E876 Hypokalemia: Principal | ICD-10-CM

## 2022-02-23 DIAGNOSIS — I10 Essential (primary) hypertension: Principal | ICD-10-CM

## 2022-02-23 DIAGNOSIS — C7951 Secondary malignant neoplasm of bone: Principal | ICD-10-CM

## 2022-02-23 DIAGNOSIS — N1832 Stage 3b chronic kidney disease (CMS-HCC): Principal | ICD-10-CM

## 2022-02-23 DIAGNOSIS — I872 Venous insufficiency (chronic) (peripheral): Principal | ICD-10-CM

## 2022-02-23 DIAGNOSIS — Z09 Encounter for follow-up examination after completed treatment for conditions other than malignant neoplasm: Principal | ICD-10-CM

## 2022-02-23 DIAGNOSIS — Z86711 Personal history of pulmonary embolism: Principal | ICD-10-CM

## 2022-02-23 LAB — BASIC METABOLIC PANEL
ANION GAP: 8 mmol/L (ref 5–14)
BLOOD UREA NITROGEN: 29 mg/dL — ABNORMAL HIGH (ref 9–23)
BUN / CREAT RATIO: 16
CALCIUM: 9.5 mg/dL (ref 8.7–10.4)
CHLORIDE: 105 mmol/L (ref 98–107)
CO2: 24.9 mmol/L (ref 20.0–31.0)
CREATININE: 1.76 mg/dL — ABNORMAL HIGH
EGFR CKD-EPI (2021) MALE: 40 mL/min/{1.73_m2} — ABNORMAL LOW (ref >=60)
GLUCOSE RANDOM: 103 mg/dL (ref 70–179)
POTASSIUM: 2.8 mmol/L — ABNORMAL LOW (ref 3.4–4.8)
SODIUM: 138 mmol/L (ref 135–145)

## 2022-02-23 LAB — MAGNESIUM: MAGNESIUM: 1.5 mg/dL — ABNORMAL LOW (ref 1.6–2.6)

## 2022-02-23 MED ORDER — MAGNESIUM OXIDE 400 MG (241.3 MG MAGNESIUM) TABLET
ORAL_TABLET | Freq: Every evening | ORAL | 1 refills | 120 days | Status: CP
Start: 2022-02-23 — End: 2023-02-23
  Filled 2022-02-25: qty 120, 120d supply, fill #0

## 2022-02-23 MED ORDER — VASHE WOUND THERAPY 0.033 % IRRIGATION SOLUTION
Freq: Every day | 3 refills | 0 days | Status: CP
Start: 2022-02-23 — End: ?

## 2022-02-23 MED ORDER — POTASSIUM CHLORIDE ER 20 MEQ TABLET,EXTENDED RELEASE(PART/CRYST)
ORAL_TABLET | ORAL | 1 refills | 31 days | Status: CP
Start: 2022-02-23 — End: 2022-03-28
  Filled 2022-02-25: qty 34, 31d supply, fill #0

## 2022-02-23 MED ORDER — AMMONIUM LACTATE 12 % LOTION
Freq: Two times a day (BID) | TOPICAL | 3 refills | 200 days | Status: CP
Start: 2022-02-23 — End: ?
  Filled 2022-02-23: qty 400, 30d supply, fill #0

## 2022-02-23 NOTE — Unmapped (Addendum)
Thank you for attending your hospital follow-up appointment today. Here is a summary of our visit.  Refills of your the lotion for your legs was sent to [facility] pharmacy.  I do not believe that insurance will cover the Vashe solution which is available at most pharmacies.  We sent a prescription for this to [facility] pharmacy and they will have to order it, and can tell you the price.      Resources:   Call [facility] to get scheduled: [phone]

## 2022-02-23 NOTE — Unmapped (Signed)
Internal Medicine Hospital Follow-Up Visit    ASSESSMENT / PLAN:  1. Hospital discharge follow-up    2. Primary hypertension    3. Stage 3b chronic kidney disease (CMS-HCC)    4. Venous stasis dermatitis of both lower extremities    5. 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 Hypokalemia    8. Hypomagnesemia      Hospital discharge follow-up for venous stasis dermatitis  -Refilled lac hydrin, Vashe wash is not covered and was too expensive.  He plans to get off amaz[employer], continue BID dressing changes with wash and lac-hydrin  -provided number for lymphedema clinic    Hypomagnesemia and hypokalemia  -checked BMP, mg today, results showed K 2.6, and magnesium 1.5  -tried to call patient twice, and left a mychart message to start magnesium nightly and KCL 20 meq BID for 3 days then once daily, plan to recheck labs in a week    CKD  -stable    HTN  - BP elevated today 158/92, was under better control in hospital, possibly related to diet or compliance.  He reports taking meds this morning.     Malignant neoplasm of prostate (CMS-HCC) - Bone metastasis (CMS-HCC) - Cancer related pain  -followed by Dr. Philomena Course, does not need refills of pain meds    Follow-up with Dr. [name] in 6-12 weeks    Medication adjustments:   Start KDur 20 meq BID x 3 days then daily  Start magnesium oxide 400 mg at bedtime      Patient provided with an updated and reconciled medications list.    Follow Up: Return in about 3 months (around 05/25/2022).  Future Appointments   Date Time Provider Department Center   05/09/2022  8:30 AM ADULT ONC LAB [facility]   05/09/2022  9:30 AM Dunn, Carlyon Shadow, ANP [facility]   05/09/2022 10:00 AM Burnis Kingfisher, FNP [facility]   05/09/2022 10:30 AM ONCHEM NURSE MULTI [facility]   05/18/2022 10:00 AM Julieanne Cotton, PA [facility]   06/03/2022 10:00 AM Amburn, Baruch Goldmann, MD [facility]     Medication adherence and barriers to the treatment plan have been addressed. Opportunities to optimize healthy behaviors have been discussed. Patient / caregiver voiced understanding.      CHIEF COMPLAINT/HISTORY OF PRESENT ILLNESS:   Hospital Follow-Up for Hospitalization Follow-up    History obtained from: Family member and Patient  Date of Hospitalization Discharge:  02/13/22     HPI:  He is here today with his daughter.  He reports his legs are looking better than when he left.  He is getting home PT.    He is doing the dressing changes  He does report urinating more than usual  He has no new complaints      MEDICATIONS AND ALLERGIES:   Reviewed and updated in Epic.  Medication and allergy review was completed by the pharmacist and discussed during this visit. Please see their note within this encounter.    SOCIAL/FAMILY HISTORY:  Social History:  Reviewed in Epic.      REVIEW OF SYSTEMS:    All other systems reviewed are negative except as noted here or in HPI.    PHYSICAL EXAM  Vitals:    Vitals:    02/23/22 1354   BP: 158/92   Pulse: 73   Resp: 16   Temp: 35.8 ??C (96.5 ??  F)   SpO2: 99%       Exam:  General:  NAD  Lungs:  CTA B  Heart:  RRR  Ext:  Legs wrapped    Labs:  Reviewed from discharge.  See Epic labs.   Radiology: Reviewed radiology studies from discharge. See in Epic.   Reviewed: Discharge summary and Labs; See results in Epic  Discussed care with: Pharmacist  Interactive Contact by phone or other method (Encounter type: Patient Outreach):     Patient Outreach History (Since 02/09/2022)       [facility] Refill Coordination       Date Method of Outreach Associated Actions User Next Outreach    02/15/2022 11:39 AM Telephone  Delane Ginger Lucious Groves M 03/15/2022              Transition of Care       Date Method of Outreach Associated Actions User Next Outreach    02/15/2022 12:02 PM Patient Portal Message  Duwaine Maxin, RN     02/14/2022  9:02 AM Telephone  Darin Engels, Katheren Shams, RN Successful contact made or 2 unsuccessful attempts within 2 business days    SUMMARY OF ASSOCIATED HOSPITALIZATION  Abdirashid Drennon is a 76 y.o. male who presented to [facility] with Venous stasis dermatitis of both lower extremities.     Worsening venous stasis dermatitis: Presented from clinic with worsening lower extremity edema and increased warmth, tenderness, drainage from chronic lower extremity venous stasis ulcers.  No evidence of systemic infection normal WBCs and no fevers.  Wounds did not appear obviously infected and no definitive cellulitis although difficult to be certain given increased warmth and worsening pain.  Patient was initially started on IV antibiotic therapy with vancomycin and aztreonam.  WOCN was consulted.  Secondary to low suspicion of infection, antibiotics were subsequently discontinued.  Patient was given a one-time dose of IV Lasix for assistance with lower extremity edema.  WOCN recommended cleansing with Vashe twice daily followed by ammonium lactate.  Patient noncompliant with compression therapy while hospitalized.  Referral was sent to lymphedema clinic at discharge for ongoing management.  Patient advised to continue compression therapy and elevation of BLE as an outpatient.  Patient strongly advised to call his PCP to arrange for close outpatient follow-up for ongoing monitoring, however, informed writer he would likely not proceed with doing this, and would prefer to just keep his December 20th appointment at this time.      Hypokalemia, hypomagnesemia: Likely secondary to diuretic therapy.  Replaced with PO and IV supplementation while hospitalized.  Consider standing potassium supplementation while on diuretic therapy, though caution with CKD.     Metastatic hormone sensitive prostate cancer: Follows with Dr. Philomena Course and Barkley Bruns, NP in addition to palliative care.  Most recent PSA stable.  He did not have his home darolutamide with him. CT abdomen on admission showed right kidney lower pole intermediate attenuation lesion, which demonstrating internal septations and hepatic segment VIII lesion, measuring up to 1.5 cm further evaluation with MRI was recommended. Discussed with patient's outpatient primary oncologist who recommended MRI imaging for further evaluation.  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  Continued home oxycodone q6 PRN. Has outpatient follow-up scheduled for 05/09/22.     CKD: Baseline creatinine around 1.6.  Slightly up from baseline on admission at 2.3 but resolved on recheck.  Hypertension: Continued home Coreg, HCTZ.     History of PE: Continued home Eliquis.     Chronic colonic dilation:  Present on scans dating back to 2020 and again seen on CT A/P in the ED with slight increase compared to March 2023 scan.  Continued passing flatus and having bowel movements without significant GI symptoms.  Abdomen quite distended on exam but remain soft and nontender.     Deconditioning: Related to age, lower extremity wounds, metastatic prostate cancer, pain, and obesity with BMI greater than 30.  He was evaluated by PT/OT who recommended 5 times low weekly, however patient declined discharge to SNF.  Case management was consulted, and patient discharged home with home health/PT/OT.

## 2022-02-23 NOTE — Unmapped (Signed)
[place] [facility] at [facility]     Type of visit: face to face    Are you located in [place]? (for virtual visits only) N/A    Reason for visit: Hospital Follow up    Questions / Concerns that need to be addressed:       Omron BPs (complete if screening BP has a systolic  > 130 or diastolic > 80)  BP#1 165/94   BP#2 159/62  BP#3 150/90    Average BP 158/92   75  (please note this as a comment in vitals)     PTHomeBP     HCDM reviewed and updated in Epic:    We are working to make sure all of our patients??? wishes are updated in Epic and part of that is documenting a Environmental health practitioner for each patient  A Health Care Decision Maker is someone you choose who can make health care decisions for you if you are not able - who would you most want to do this for you????  is already up to date.    HCDM (patient stated preference): Zahki, Cypert - Daughter - [phone]    BPAs completed:      COVID-19 Vaccine Summary  Which COVID-19 Vaccine was administered  Pfizer  Type:  Dates Given:  04/02/2021                   If no: Are you interested in scheduling?     Immunization History   Administered Date(s) Administered    COVID-19 VAC,BIVALENT,MODERNA(BLUE CAP) 04/02/2021    COVID-19 VAC,MRNA,TRIS(12Y UP)(PFIZER)(GRAY CAP) 11/20/2020    COVID-19 VACC,MRNA,(PFIZER)(PF) 11/20/2020    COVID-19 VACCINE,MRNA(MODERNA)(PF) 07/02/2019, 07/30/2019, 05/18/2020    DTaP / IPV 01/28/2013    INFLUENZA QUAD ADJUVANTED 62YR UP(FLUAD) 02/19/2021    Influenza Vaccine Quad (IIV4 PF) 36mo+ injectable 03/18/2016, 03/22/2017, 03/20/2018, 02/27/2019    Influenza Virus Vaccine, unspecified formulation 05/31/2015, 03/11/2019    PNEUMOCOCCAL POLYSACCHARIDE 23 04/13/2016, 04/08/2019    Pneumococcal Conjugate 13-Valent 06/30/2014    TdaP 08/20/2011       __________________________________________________________________________________________    SCREENINGS COMPLETED IN FLOWSHEETS    HARK Screening       AUDIT  AUDIT - C Score (Part 1): 3    PHQ2       PHQ9          P4 Suicidality Screener                GAD7       COPD Assessment       Falls Risk

## 2022-02-23 NOTE — Unmapped (Signed)
Reason for Visit: Hospital Follow-up Medication Management      Assessment and Plan:     # Hypertension uncontrolled per clinic average today of 158/92 mmHg. Caution with diuretic therapy given history of CKD, lymphedema, and risk of electrolyte abnormality.   Future consideration: ARB if tolerated, amlodipine 2.5-5 mg       # Medication Management   Encouraged use of medication pill box and reviewed appropriate use. Reviewed the indication, dose, and frequency of each medication with patient.       Recommendations and medication-related problems were discussed directly with the patient's transitions of care physician, Dr. Rose Fillers, immediately following the pharmacist visit prior to the physician visit. Questions/concerns were addressed to the patient's satisfaction. I spent a total of 20 minutes face to face with the patient delivering clinical care and providing education/counseling.    Karalee Height, PharmD CPP  Clinical Pharmacist      History of Present Illness:  Adam Hoover is a 76 y.o. male with a past medical history of HTN, LEE, CKD, prostate cancer, history of PE/DVT (2017) who was recently hospitalized from 02/09/22 to 02/13/22 for worsening venous stasis dermatitis. Pt presents to the Internal Medicine Hospital Follow up Clinic for follow-up without all of his medication bottles. Presents to visit with daughter [name].    At discharge, start ammonium lactate 12% lotion apply BID    Outpatient Provider Follow Up Issues:   [ ]  Referral sent to outpatient lymphedema clinic at discharge  [ ]  Intermittently monitor BMP and magnesium level while on diuretic therapy  [ ]  Consider standing potassium supplementation while on diuretic therapy, though caution with CKD    Since discharge, swelling has gone down; feels alright; weighing himself at home. Transportation is a barrier- depends on daughter [name].     Medication Adherence and Access:  Missed doses?: no  Uses pillbox?: no  Anyone else assist with medication organization? no  Current insurance coverage: Medicare Medicaid  Preferred Pharmacy: [facility], [facility]    Medications affordable?: yes  Needs refills? yes - ammonium lactate used twice daily; solution for wound (not covered by insurance)    Allergies:   Allergies   Allergen Reactions    Acetaminophen Hives     Per pt report Only, tolerated well.     Penicillins Hives    Lisinopril      Angioedema        Medications: Medications reviewed in EPIC medication station and updated today by the clinical pharmacist practitioner.      Future Appointments   Date Time Provider Department Center   02/23/2022  1:30 PM MEDMED HOSPITAL FOLLOW-UP [facility]   05/09/2022  8:30 AM ADULT ONC LAB [facility]   05/09/2022  9:30 AM Dunn, Carlyon Shadow, ANP [facility]   05/09/2022 10:00 AM Burnis Kingfisher, FNP [facility]   05/09/2022 10:30 AM ONCHEM NURSE MULTI [facility]   05/18/2022 10:00 AM Lorin Picket, Scot Jun, PA [facility]     ___________________________________________________

## 2022-02-23 NOTE — Unmapped (Signed)
I called Mr. Pipitone, but was unable to contact him so contacted his daugher, and reviewed the plan for potassium and magnesium replacement along with plan to have labs drawn next week to recheck.  The labs have been ordered.    Clyda Hurdle

## 2022-03-02 ENCOUNTER — Ambulatory Visit: Admit: 2022-03-02 | Discharge: 2022-03-03 | Payer: MEDICARE

## 2022-03-02 DIAGNOSIS — E876 Hypokalemia: Principal | ICD-10-CM

## 2022-03-02 LAB — BASIC METABOLIC PANEL
ANION GAP: 11 mmol/L (ref 5–14)
BLOOD UREA NITROGEN: 36 mg/dL — ABNORMAL HIGH (ref 9–23)
BUN / CREAT RATIO: 21
CALCIUM: 9.7 mg/dL (ref 8.7–10.4)
CHLORIDE: 108 mmol/L — ABNORMAL HIGH (ref 98–107)
CO2: 22.8 mmol/L (ref 20.0–31.0)
CREATININE: 1.69 mg/dL — ABNORMAL HIGH
EGFR CKD-EPI (2021) MALE: 42 mL/min/{1.73_m2} — ABNORMAL LOW (ref >=60)
GLUCOSE RANDOM: 99 mg/dL (ref 70–99)
POTASSIUM: 3.4 mmol/L (ref 3.4–4.8)
SODIUM: 142 mmol/L (ref 135–145)

## 2022-03-02 LAB — MAGNESIUM: MAGNESIUM: 1.7 mg/dL (ref 1.6–2.6)

## 2022-03-17 NOTE — Unmapped (Signed)
The PAC has received an incoming clinical call:    Caller name: Rae Halsted OT   Best callback number: [phone]  Relationship to Patient: Occupational therapists   Describe the reason for the call: Caller just wanted to inform the provider that patient is refusing OT.

## 2022-03-22 NOTE — Unmapped (Signed)
[facility] Refill Coordination Note    Specialty Medication(s) to be Shipped:   Hematology/Oncology: Nubeqa    Other medication(s) to be shipped: No additional medications requested for fill at this time     Adam Hoover, DOB: [birth year]  Phone: [phone] (home)       All above HIPAA information was verified with patient's family member, Adam Hoover.     Was a Nurse, learning disability used for this call? No    Completed refill call assessment today to schedule patient's medication shipment from the [facility] [phone]).  All relevant notes have been reviewed.     Specialty medication(s) and dose(s) confirmed: Regimen is correct and unchanged.   Changes to medications: Adam Hoover reports no changes at this time.  Changes to insurance: No  New side effects reported not previously addressed with a pharmacist or physician: None reported  Questions for the pharmacist: No    Confirmed patient received a Conservation officer, historic buildings and a Surveyor, mining with first shipment. The patient will receive a drug information handout for each medication shipped and additional FDA Medication Guides as required.       DISEASE/MEDICATION-SPECIFIC INFORMATION        N/A    SPECIALTY MEDICATION ADHERENCE     Medication Adherence    Patient reported X missed doses in the last month: 1  Specialty Medication: Nubeqa 300mg   Patient is on additional specialty medications: No  Patient is on more than two specialty medications: No  Any gaps in refill history greater than 2 weeks in the last 3 months: no  Demonstrates understanding of importance of adherence: yes  Informant: patient                 Were doses missed due to medication being on hold? No    Nubeqa 300mg : 10 days of medicine on hand        REFERRAL TO PHARMACIST     Referral to the pharmacist: Not needed      [facility]     Shipping address confirmed in Epic.     Delivery Scheduled: Yes, Expected medication delivery date: 04/07/22 .     Medication will be delivered via Next Day Courier to the prescription address in Epic WAM.    Adam Hoover   [facility] Specialty Technician

## 2022-03-30 ENCOUNTER — Ambulatory Visit: Admit: 2022-03-30 | Payer: MEDICARE
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
## 2022-04-06 MED FILL — NUBEQA 300 MG TABLET: ORAL | 30 days supply | Qty: 120 | Fill #1

## 2022-04-20 MED FILL — ELIQUIS 5 MG TABLET: ORAL | 30 days supply | Qty: 60 | Fill #0

## 2022-04-29 DIAGNOSIS — I10 Essential (primary) hypertension: Principal | ICD-10-CM

## 2022-04-29 MED ORDER — CARVEDILOL 3.125 MG TABLET
ORAL_TABLET | Freq: Two times a day (BID) | ORAL | 11 refills | 30 days
Start: 2022-04-29 — End: 2023-04-29

## 2022-05-02 MED ORDER — CARVEDILOL 3.125 MG TABLET
ORAL_TABLET | Freq: Two times a day (BID) | ORAL | 1 refills | 90 days | Status: CP
Start: 2022-05-02 — End: 2022-08-28
  Filled 2022-05-10: qty 360, 90d supply, fill #0

## 2022-05-03 MED FILL — HYDROCHLOROTHIAZIDE 25 MG TABLET: ORAL | 90 days supply | Qty: 90 | Fill #2

## 2022-05-05 NOTE — Unmapped (Signed)
Patient's daughter denied refills for Nubeqa at this time, she is requesting a call back in two weeks.

## 2022-05-09 ENCOUNTER — Ambulatory Visit: Admit: 2022-05-09 | Discharge: 2022-05-09 | Payer: MEDICARE

## 2022-05-09 ENCOUNTER — Ambulatory Visit
Admit: 2022-05-09 | Discharge: 2022-05-09 | Payer: MEDICARE | Attending: Nurse Practitioner | Primary: Nurse Practitioner

## 2022-05-09 ENCOUNTER — Institutional Professional Consult (permissible substitution): Admit: 2022-05-09 | Discharge: 2022-05-09 | Payer: MEDICARE

## 2022-05-09 ENCOUNTER — Other Ambulatory Visit: Admit: 2022-05-09 | Discharge: 2022-05-09 | Payer: MEDICARE

## 2022-05-09 DIAGNOSIS — C61 Malignant neoplasm of prostate: Principal | ICD-10-CM

## 2022-05-09 DIAGNOSIS — Z515 Encounter for palliative care: Principal | ICD-10-CM

## 2022-05-09 DIAGNOSIS — I872 Venous insufficiency (chronic) (peripheral): Principal | ICD-10-CM

## 2022-05-09 DIAGNOSIS — G893 Neoplasm related pain (acute) (chronic): Principal | ICD-10-CM

## 2022-05-09 LAB — COMPREHENSIVE METABOLIC PANEL
ALBUMIN: 3.5 g/dL (ref 3.4–5.0)
ALKALINE PHOSPHATASE: 84 U/L (ref 46–116)
ALT (SGPT): 8 U/L — ABNORMAL LOW (ref 10–49)
ANION GAP: 9 mmol/L (ref 5–14)
AST (SGOT): 14 U/L (ref <=34)
BILIRUBIN TOTAL: 0.4 mg/dL (ref 0.3–1.2)
BLOOD UREA NITROGEN: 57 mg/dL — ABNORMAL HIGH (ref 9–23)
BUN / CREAT RATIO: 26
CALCIUM: 9.4 mg/dL (ref 8.7–10.4)
CHLORIDE: 107 mmol/L (ref 98–107)
CO2: 23 mmol/L (ref 20.0–31.0)
CREATININE: 2.19 mg/dL — ABNORMAL HIGH
EGFR CKD-EPI (2021) MALE: 31 mL/min/{1.73_m2} — ABNORMAL LOW (ref >=60)
GLUCOSE RANDOM: 118 mg/dL (ref 70–179)
POTASSIUM: 3.5 mmol/L (ref 3.5–5.1)
PROTEIN TOTAL: 7.9 g/dL (ref 5.7–8.2)
SODIUM: 139 mmol/L (ref 135–145)

## 2022-05-09 LAB — CBC W/ AUTO DIFF
BASOPHILS ABSOLUTE COUNT: 0 10*9/L (ref 0.0–0.1)
BASOPHILS RELATIVE PERCENT: 0.5 %
EOSINOPHILS ABSOLUTE COUNT: 0.1 10*9/L (ref 0.0–0.5)
EOSINOPHILS RELATIVE PERCENT: 1.1 %
HEMATOCRIT: 32.4 % — ABNORMAL LOW (ref 39.0–48.0)
HEMOGLOBIN: 10.9 g/dL — ABNORMAL LOW (ref 12.9–16.5)
LYMPHOCYTES ABSOLUTE COUNT: 1 10*9/L — ABNORMAL LOW (ref 1.1–3.6)
LYMPHOCYTES RELATIVE PERCENT: 21 %
MEAN CORPUSCULAR HEMOGLOBIN CONC: 33.6 g/dL (ref 32.0–36.0)
MEAN CORPUSCULAR HEMOGLOBIN: 31.1 pg (ref 25.9–32.4)
MEAN CORPUSCULAR VOLUME: 92.5 fL (ref 77.6–95.7)
MEAN PLATELET VOLUME: 8.4 fL (ref 6.8–10.7)
MONOCYTES ABSOLUTE COUNT: 0.5 10*9/L (ref 0.3–0.8)
MONOCYTES RELATIVE PERCENT: 11 %
NEUTROPHILS ABSOLUTE COUNT: 3.1 10*9/L (ref 1.8–7.8)
NEUTROPHILS RELATIVE PERCENT: 66.4 %
PLATELET COUNT: 245 10*9/L (ref 150–450)
RED BLOOD CELL COUNT: 3.5 10*12/L — ABNORMAL LOW (ref 4.26–5.60)
RED CELL DISTRIBUTION WIDTH: 15.5 % — ABNORMAL HIGH (ref 12.2–15.2)
WBC ADJUSTED: 4.7 10*9/L (ref 3.6–11.2)

## 2022-05-09 LAB — PSA: PROSTATE SPECIFIC ANTIGEN: 8.61 ng/mL — ABNORMAL HIGH (ref 0.00–4.00)

## 2022-05-09 MED ORDER — OXYCODONE 5 MG TABLET
ORAL_TABLET | Freq: Three times a day (TID) | ORAL | 0 refills | 20 days | Status: CP | PRN
Start: 2022-05-09 — End: ?
  Filled 2022-05-09: qty 60, 20d supply, fill #0

## 2022-05-09 MED ADMIN — leuprolide acetate (6 month) (ELIGARD) injection 45 mg: 45 mg | SUBCUTANEOUS | @ 16:00:00 | Stop: 2022-05-09

## 2022-05-09 NOTE — Unmapped (Signed)
Patient received Eligard 45mg   SQ in Injection site: right lower abdomen. Band aid and guaze applied. Patient tolerated injection without complications.

## 2022-05-09 NOTE — Unmapped (Signed)
OUTPATIENT ONCOLOGY PALLIATIVE CARE    Principal Diagnosis: Mr. Passanisi is a 76 y.o. 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 Complicated by co-morbid acute and chronic conditions including low back pain, acute on chronic kidney disease, PE, tracheal stenosis, alcohol use disorder, and chronic dilated transverse colon.     Assessment/Plan:     # Pain: Bilateral knee and leg pain due to edema. Hx of back pain. usually related to exertion, and self-limiting.   - Continue oxycodone 2.5-5 mg every 8 hours for pain or shortness of breath.  Prescription sent today.  - No PO NSAIDs given AC use and cardiac hx  - No APAP given allergy    # Leg edema  - Plan is for lymphedema clinic in 06/03/22     # ACP:   -Completed MOST and DNR/DNI form on 05/09/2022  -Limited medical interventions, antibiotics if needed, IV fluids if indicated and no feeding tube.  -Wants to have peace in his life and his illness not to be a hardship for his daughter.    Primary value is independence. Does have the prepare for your care document.    Confirmed Health Care Decision Maker as of 05/09/2022    HCDM (patient stated preference): Kamai, Nowicki - Daughter - [phone]         #Controlled substances risk management:  Patient has a signed pain medication agreement with Outpatient Palliative Care, completed on 11/02/20, as per standard of care.  [place]CCS[place] database was reviewed today and it was appropriate.  Urine drug screen was not performed at this visit. Findings: not applicable.  Patient has received information about safe storage and administration of medications.  Patient has received a prescription for narcan; is not applicable.     F/u: 3 months-clinic    ----------------------------------------  Referring Provider: Dr. 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     Current cancer-directed therapy:  Nubeqa     Symptom Review: Initial visit  General: Overall slowly feeling better since being discharged from the hospital  Pain: Pain generally well controlled right now. Tightness in legs is his biggest concern. Taking MS Contin 30mg  every 12 hours. Taking Oxycodone 5mg  as needed. Over the last 2 days he has only taken one oxycodone. Has 10 tabs left.   Fatigue: Some days he feels slugish but other days feels ok. Feels like he has to take his time with getting things done. But is still able to do some work in the yard and do some walking.    Mobility: Independent  Sleep: Good  Appetite: Stable, has not lost significant weight.  Thinks he has gained weight since discharge.   Nausea: None   Bowel function: Having BMs every day. Has been using Miralax every other day. Not taking senna currenlty.   Dyspnea: Mild with exertion, none at rest.    Secretions: None  Mood: Ok overall.     Interval hx 05/09/22 CK, Mr. Brintnall and daughter [name]      Symptom Review:  General: has good days and bad days.  Still able to live independently.  Daughter checks in once a week.  Feels that leg edema overall has improved.  No drainage from legs and home health team is no longer coming out.  Pain: Prior pain in his knees due to edema.  Can go a week sometimes without taking oxycodone.  Fatigue: Moderate  Mobility: Uses wheelchair at [facility],   Sleep: Overall okay  Bowel function: No issues.  Has a bowel movement daily does not need MiraLAX or Senokot.  Dyspnea: Can have shortness of breath if he overexerts himself.  Mood: Okay        Palliative Performance Scale: 70% - Ambulation: Reduced / unable to do normal work, some evidence of disease / Self-Care: Full / Intake: Normal or reduced / Level of Conscious: Full    Coping/Support Issues: Patient is close to his daughter [name], and likes to think of the positive side of things, accept responsibility for his role in his illness, and take things day by day.     Goals of Care: Cancer-directed therapy: durolutamide    Social History: Lives alone (daughter lives 15 minutes away); Worked as a Nutritional therapist.  Name of primary support: Daughter [name]  Hobbies: Cooking, yardwork  Current residence / distance from [facility]: [place]      Objective     Allergies:   Allergies   Allergen Reactions    Acetaminophen Hives     Per pt report Only, tolerated well.     Penicillins Hives    Lisinopril      Angioedema        Family History:  Cancer-related family history includes Cancer in his father.  He indicated that his mother is deceased. He indicated that his father is deceased. He indicated that all of his three brothers are alive. He indicated that his daughter is alive. He indicated that the status of his neg hx is unknown.      REVIEW OF SYSTEMS:  A comprehensive review of 14 systems was negative except for pertinent positives noted in HPI.    PHYSICAL EXAM:   VS reviewed in EPIC.  GEN: Obese, well-developed, sitting in a transport chair  PSYCH: calm  CV: Reg rate and rhythm   LUNGS: Scattered rhonchi  ABD: Abdomen distended and taut.  SKIN: See photo for left leg ulcer.  NEURO: Conversant, alert/oriented     Lab Results   Component Value Date    CREATININE 1.69 (H) 03/02/2022     Lab Results   Component Value Date    ALKPHOS 93 02/09/2022    BILITOT 0.3 02/09/2022    BILIDIR 0.20 08/11/2021    PROT 8.0 02/09/2022    ALBUMIN 3.6 02/09/2022    ALT <7 (L) 02/09/2022    AST 13 02/09/2022       Pam Drown, FNP-BC, [facility]  Outpatient Oncology Palliative Care Service  [facility]  [address] [place], [place] [address]  [phone]     I personally spent 40 minutes face-to-face and non-face-to-face in the care of this patient, which includes all pre, intra, and post visit time on the date of service.  All documented time was specific to the E/M visit and does not include any procedures that may have been performed.

## 2022-05-09 NOTE — Unmapped (Signed)
Injection today, again in 6 months  Continue cancer medication for now  Follow up with your primary care provider  Next week when you see PCP: recheck renal function, get kidney ultrasound- will let you know the results  PSA is stable- continue current cancer medication  Drink more water and double void (pee then go back in 15 minutes and pee again)    [name], NP-C, OCN  Adult Nurse Practitioner  Urology and Medical Oncology    Notice: Many test results will be automatically released into MyChart. This may happen before your provider has a chance to review them. Your results will either be reviewed with you at your scheduled appointment or your provider or someone from their office will reach out to you to discuss the results. Thank you for your understanding    If you have been prescribed a medication today, always read the package insert that comes with the medication.    In the event of an emergency, always call 911    Urology:  Main clinic & [place]: [phone]  Fax: [phone]    Cancer Hospital:  Phone: [phone]  Fax: [phone]    After hours/nights/weekends:  Hospital Operator: [phone]    [email or web address]  [email or web address]
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
## 2022-05-18 ENCOUNTER — Ambulatory Visit: Admit: 2022-05-18 | Discharge: 2022-05-19 | Payer: MEDICARE

## 2022-05-18 DIAGNOSIS — I872 Venous insufficiency (chronic) (peripheral): Principal | ICD-10-CM

## 2022-05-18 DIAGNOSIS — I10 Essential (primary) hypertension: Principal | ICD-10-CM

## 2022-05-18 DIAGNOSIS — Z86711 Personal history of pulmonary embolism: Principal | ICD-10-CM

## 2022-05-18 NOTE — Unmapped (Signed)
Jillene Bucks, DMSc, PA-C    Assessment/Plan:     Body mass index is 35.57 kg/m??.    DOAC: Eliquis 5 mg twice daily    Dose appropriate based on indication, age, weight, renal/liver function, bleed risk, BMI, concomitant meds: yes  DOAC trough indicated: no  GI protection needed based on age, h/o GI complication, concomitant antiplatelet tx: no    1.  History of PE-creatinine is greater than 1.5 however we will continue Eliquis 5 mg twice daily based on age and weight. Given his active cancer, we will continue anticoagulation until instructed otherwise by heme-onc.  He had labs last week through oncology and hemoglobin appears stable, LFTs unremarkable.  2.  Hypertension-SBP borderline, continue regimen.  3.  Venous stasis dermatitis-starts PT in the near future.    Follow up: 6 months    Subject/Objective:     Patient: Adam Hoover 76 y.o.    Medical Record Number:  [id number]    PRIMARY CARE PHYSICIAN: Ocie Doyne, MD    UX:[id number] VTE, htn    PMHx:   Patient Active Problem List   Diagnosis    Age-related cataract    Tracheal stenosis    History of pulmonary embolism, DVT/PE 07/17/15    Diverticulosis of large intestine    History of lower GI bleeding    Hypertension    Macrocytic anemia    Alcohol abuse    Hypomagnesemia    Bleeding internal hemorrhoids    Angioedema    Lower extremity edema    Low back pain    Chronic kidney disease (CKD)    Weight loss    Malignant neoplasm of prostate (CMS-HCC)    Cancer related pain    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 topically Two (2) times a day. 400 g 3    apixaban (ELIQUIS) 5 mg Tab Take 1 tablet by mouth two (2) times a day. 60 tablet 11    carvediloL (COREG) 3.125 MG tablet Take 2 tablets (6.25 mg total) by mouth two (2) times a day. 360 tablet 1    darolutamide (NUBEQA) 300 mg tablet Take 2 tablets (600 mg total) by mouth in the morning and 2 tablets (600 mg total) in the evening. Take with meals. Take with food. Swallow tablets whole.. 120 tablet 11    ferrous sulfate 325 (65 FE) MG tablet Take 1 tablet (325 mg total) by mouth every other day. 15 tablet 11    hydroCHLOROthiazide (HYDRODIURIL) 25 MG tablet Take 1 tablet (25 mg total) by mouth daily. 90 tablet 3    magnesium oxide (MAG-OX) 400 mg (241.3 mg elemental magnesium) tablet Take 1 tablet (400 mg total) by mouth nightly. 120 tablet 1    naloxone (NARCAN) 4 mg nasal spray One spray in either nostril once for known/suspected opioid overdose. May repeat every 2-3 minutes in alternating nostril til EMS arrives 2 each 0    oxyCODONE (ROXICODONE) 5 MG immediate release tablet Take 1/2 to 1 tablet (2.5-5 mg total) by mouth every eight (8) hours as needed for pain. 60 tablet 0     No current facility-administered medications on file prior to visit.  General:  Well appearing, no acute distress    Signs/Symptoms bleeding or bruising: no    Signs/Symptoms of Potential Embolic Events: no       Medications reviewed including OTC and herbals:yes    The patient set a personal goal for anticoagulation management today:Take warfarin daily as recommended.    Medication adherence and barriers to the treatment plan have been addressed. Opportunities to optimize healthy behaviors have been discussed. Patient / caregiver voiced understanding.      Additional History:       Note - This record has been created using [employer]. Chart creation errors have been sought, but may not always have been located. Such creation errors do not reflect on the standard of medical care.

## 2022-05-18 NOTE — Unmapped (Signed)
inf

## 2022-06-03 ENCOUNTER — Ambulatory Visit: Admit: 2022-06-03 | Payer: MEDICARE

## 2022-06-03 ENCOUNTER — Ambulatory Visit
Admit: 2022-06-03 | Discharge: 2022-06-04 | Payer: MEDICARE | Attending: Student in an Organized Health Care Education/Training Program | Primary: Student in an Organized Health Care Education/Training Program

## 2022-06-03 DIAGNOSIS — N189 Chronic kidney disease, unspecified: Principal | ICD-10-CM

## 2022-06-03 DIAGNOSIS — N179 Acute kidney failure, unspecified: Principal | ICD-10-CM

## 2022-06-03 DIAGNOSIS — I10 Essential (primary) hypertension: Principal | ICD-10-CM

## 2022-06-03 DIAGNOSIS — E876 Hypokalemia: Principal | ICD-10-CM

## 2022-06-03 LAB — BASIC METABOLIC PANEL
ANION GAP: 7 mmol/L (ref 5–14)
BLOOD UREA NITROGEN: 40 mg/dL — ABNORMAL HIGH (ref 9–23)
BUN / CREAT RATIO: 24
CALCIUM: 9.9 mg/dL (ref 8.7–10.4)
CHLORIDE: 106 mmol/L (ref 98–107)
CO2: 26.1 mmol/L (ref 20.0–31.0)
CREATININE: 1.67 mg/dL — ABNORMAL HIGH
EGFR CKD-EPI (2021) MALE: 43 mL/min/{1.73_m2} — ABNORMAL LOW (ref >=60)
GLUCOSE RANDOM: 105 mg/dL (ref 70–179)
POTASSIUM: 3.3 mmol/L — ABNORMAL LOW (ref 3.4–4.8)
SODIUM: 139 mmol/L (ref 135–145)

## 2022-06-03 LAB — MAGNESIUM: MAGNESIUM: 1.8 mg/dL (ref 1.6–2.6)

## 2022-06-03 MED ORDER — POTASSIUM CHLORIDE ER 20 MEQ TABLET,EXTENDED RELEASE(PART/CRYST)
ORAL_TABLET | Freq: Every day | ORAL | 3 refills | 90 days | Status: CP
Start: 2022-06-03 — End: 2023-06-03
  Filled 2022-06-03: qty 90, 90d supply, fill #0

## 2022-06-03 MED ORDER — CARVEDILOL 12.5 MG TABLET
ORAL_TABLET | Freq: Two times a day (BID) | ORAL | 3 refills | 90 days | Status: CP
Start: 2022-06-03 — End: 2023-06-03
  Filled 2022-06-03: qty 180, 90d supply, fill #0

## 2022-06-03 NOTE — Unmapped (Signed)
Thank you for visiting the clinic today! Here is what we discussed:    Your blood pressure is still a little on the higher side today. I would like you to increase the dose of carvedilol (Coreg) to 12.5mg  two times a day. I have sent in a new prescription for you.  You should continue with the treatments for your lower extremity wounds and continue to elevate your legs at home as you are able.   At your last lab check your creatinine (marker of kidney function) was elevated, so we will recheck this today. I will follow up these results with you.

## 2022-06-03 NOTE — Unmapped (Addendum)
The [facility] has made a third and final attempt to reach this patient to refill the following medication:Nubeqa.      We have left voicemails on the following phone numbers: [phone], have been unable to leave messages on the following phone numbers: [phone], have sent a text message to the following phone numbers: [phone], and have sent a Mychart questionnaire..    Dates contacted: 12/20,27,1/5  Last scheduled delivery: 04/06/22    The patient may be at risk of non-compliance with this medication. The patient should call the [facility] at [phone]  Option 4, then Option 1 (oncology) to refill medication.    [name] Hulda Humphrey   [facility] Specialty Technician

## 2022-06-03 NOTE — Unmapped (Signed)
Internal Medicine Clinic Visit    Reason for visit: Follow up    A/P:         1. Essential hypertension    2. Acute kidney injury superimposed on chronic kidney disease (CMS-HCC)    3. Hypomagnesemia    4. Hypokalemia      1. Essential hypertension  His blood pressure is mildly elevated in clinic today at 142/81. He does not routinely check blood pressure at home, but I do suspect this is accurate as most of his BP checks in clinic have been elevated in 130-150s/70-90s over the past 3 months. Although he would benefit from RAAS inhibition, he has had angioedema with lisinopril in the past. We will continue to titrate his medications to try and optimize his blood pressure.   -Continue hydrochlorothiazide 25mg  daily   -Increase carvedilol dose to 12.5mg  BID   -Encouraged home BP monitoring to ensure accurate measurements    2. AKI on CKD  At most recent blood draw with oncology noted to have elevated creatinine to 2.19 from baseline 1.6. No obvious etiology was identified at that time. He does not endorse any urinary symptoms today. We will plan to recheck chemistry today and continue to monitor closely.   -Check BMP today     3. Hypomagnesemia  He continues on nightly replacement. We will check serum magnesium level to ensure stability on this regimen.   -Continue magnesium oxide 400mg  nightly   -Check magnesium level    4. Hypokalemia  He was previously on potassium replacement but had completed therapy. We will plan to recheck electrolytes and add back as indicated.  -Check BMP today   -If K is low, will add back KCl daily       Return in about 6 weeks (around 07/15/2022) for BP recheck .    Staffed with Dr. Marin Roberts, seen and discussed    __________________________________________________________    HPI:    He has been doing well since his last appointment and has no acute concerns today. He continues to have swelling of the lower extremities but has been following with lymphedema clinic and has been performing wound care daily. He attempts to elevate his legs at home and notes that this does improve symptoms. He does not routinely check blood pressure at home. He has been taking medications as prescribed.   __________________________________________________________        Medications:  Reviewed in EPIC  __________________________________________________________    Physical Exam:   Vital Signs:  Vitals:    06/03/22 1015   BP: 142/81   Pulse: 70   Resp: 18   Temp: 36.5 ??C (97.7 ??F)   TempSrc: Temporal   SpO2: 97%   Weight: (!) 119.8 kg (264 lb 3.2 oz)   Height: 180.3 cm (5' 11)      PTHomeBP    The patient???s Average Home Blood Pressure during the last two weeks is :   /   based on  readings    Gen: Well appearing, in NAD, daughter accompanying him to visit  CV: RRR, no murmurs  Pulm: CTA bilaterally, no crackles or wheezes  Abd: Soft, NTND, normal BS  Ext: BLE with compression wrappings, persistent 1-2+ pitting edema to level of knee bilaterally    PHQ-9 Score:     GAD-7 Score:       Medication adherence and barriers to the treatment plan have been addressed. Opportunities to optimize healthy behaviors have been discussed. Patient / caregiver voiced understanding.  Ocie Doyne, MD  [facility] Internal Medicine - PGY-3  Pager: [phone]
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
## 2022-06-03 NOTE — Unmapped (Signed)
[facility] Internal Medicine at [facility]     Reason for visit: follow up.    Questions / Concerns that need to be addressed: Routine check.    151/86, 72    Omron BPs (complete if screening BP has a systolic  > 129 or diastolic > 79)  BP#1 144/82   BP#2 145/82  BP#3  138/80    Average BP  142/81  (please note this as a comment in vitals)         Allergies reviewed: Yes    Medication reviewed: Yes  Pended refills? No        HCDM reviewed and updated in Epic:    We are working to make sure all of our patients??? wishes are updated in Epic and part of that is documenting a Environmental health practitioner for each patient  A Health Care Decision Maker is someone you choose who can make health care decisions for you if you are not able - who would you most want to do this for you????  was updated.        BPAs completed:        COVID-19 Vaccine Summary  Which COVID-19 Vaccine was administered  Pfizer  Type:  Dates Given:                   If no: Are you interested in scheduling?     Immunization History   Administered Date(s) Administered    COVID-19 VAC,BIVALENT,MODERNA(BLUE CAP) 04/02/2021    COVID-19 VAC,MRNA,TRIS(12Y UP)(PFIZER)(GRAY CAP) 11/20/2020    COVID-19 VACC,MRNA,(PFIZER)(PF) 11/20/2020    COVID-19 VACCINE,MRNA(MODERNA)(PF) 07/02/2019, 07/30/2019, 05/18/2020    DTaP / IPV 01/28/2013    INFLUENZA QUAD ADJUVANTED 85YR UP(FLUAD) 02/19/2021, 05/18/2022    Influenza Vaccine Quad(IM)6 MO-Adult(PF) 03/18/2016, 03/22/2017, 03/20/2018, 02/27/2019    Influenza Virus Vaccine, unspecified formulation 05/31/2015, 03/11/2019    PNEUMOCOCCAL POLYSACCHARIDE 23-VALENT 04/13/2016, 04/08/2019    Pneumococcal Conjugate 13-Valent 06/30/2014    TdaP 08/20/2011       __________________________________________________________________________________________    SCREENINGS COMPLETED IN FLOWSHEETS    HARK Screening       AUDIT       PHQ2       PHQ9          Link for Multi-language PHQ/GAD screeners: [email or web address]    P4 Suicidality Screener                GAD7       COPD Assessment       Falls Risk       .imcres

## 2022-06-10 NOTE — Unmapped (Signed)
[facility] Clinical Assessment & Refill Coordination Note    Adam Hoover, Adam Hoover: Mar 02, 1946  Phone: [phone] (home)     All above HIPAA information was verified with patient.     Was a Nurse, learning disability used for this call? No    Specialty Medication(s):   Hematology/Oncology: Adam Hoover     Current Outpatient Medications   Medication Sig Dispense Refill    ammonium lactate (LAC-HYDRIN) 12 % lotion Apply 1 application. topically Two (2) times a day. 400 g 3    apixaban (ELIQUIS) 5 mg Tab Take 1 tablet by mouth two (2) times a day. 60 tablet 11    carvedilol (COREG) 12.5 MG tablet Take 1 tablet (12.5 mg total) by mouth two (2) times a day. 180 tablet 3    darolutamide (NUBEQA) 300 mg tablet Take 2 tablets (600 mg total) by mouth in the morning and 2 tablets (600 mg total) in the evening. Take with meals. Take with food. Swallow tablets whole.. 120 tablet 11    ferrous sulfate 325 (65 FE) MG tablet Take 1 tablet (325 mg total) by mouth every other day. 15 tablet 11    hydroCHLOROthiazide (HYDRODIURIL) 25 MG tablet Take 1 tablet (25 mg total) by mouth daily. 90 tablet 3    magnesium oxide (MAG-OX) 400 mg (241.3 mg elemental magnesium) tablet Take 1 tablet (400 mg total) by mouth nightly. 120 tablet 1    naloxone (NARCAN) 4 mg nasal spray One spray in either nostril once for known/suspected opioid overdose. May repeat every 2-3 minutes in alternating nostril til EMS arrives (Patient not taking: Reported on 06/03/2022) 2 each 0    oxyCODONE (ROXICODONE) 5 MG immediate release tablet Take 1/2 to 1 tablet (2.5-5 mg total) by mouth every eight (8) hours as needed for pain. 60 tablet 0    potassium chloride 20 MEQ ER tablet Take 1 tablet (20 mEq total) by mouth daily. 90 tablet 3     No current facility-administered medications for this visit.        Changes to medications: Adam Hoover reports no changes at this time.    Allergies   Allergen Reactions    Acetaminophen Hives     Per pt report Only, tolerated well.     Penicillins Hives    Lisinopril      Angioedema        Changes to allergies: No    SPECIALTY MEDICATION ADHERENCE     Nubeqa 300 mg: about 16 days of medicine on hand     Medication Adherence    Patient reported X missed doses in the last month: 1  Specialty Medication: Nubeqa 300mg  - 2 tablets twice daily  Patient is on additional specialty medications: No  Informant: patient                  Confirmed plan for next specialty medication refill: delivery by pharmacy  Refills needed for supportive medications: not needed          Specialty medication(s) dose(s) confirmed: Regimen is correct and unchanged.     Are there any concerns with adherence? No    Adherence counseling provided? Not needed    CLINICAL MANAGEMENT AND INTERVENTION      Clinical Benefit Assessment:    Do you feel the medicine is effective or helping your condition? Yes    Clinical Benefit counseling provided? Not needed    Adverse Effects Assessment:    Are you experiencing any side effects?  No new or worsening side effects    Are you experiencing difficulty administering your medicine? No    Quality of Life Assessment:    Quality of Life    Rheumatology  Oncology  1. What impact has your specialty medication had on the reduction of your daily pain or discomfort level?: None  2. On a scale of 1-10, how would you rate your ability to manage side effects associated with your specialty medication? (1=no issues, 10 = unable to take medication due to side effects): 1  Dermatology  Cystic Fibrosis          How many days over the past month did your prostate cancer  keep you from your normal activities? For example, brushing your teeth or getting up in the morning. 0    Have you discussed this with your provider? Not needed    Acute Infection Status:    Acute infections noted within Epic:  No active infections  Patient reported infection: None    Therapy Appropriateness:    Is therapy appropriate and patient progressing towards therapeutic goals? Yes, therapy is appropriate and should be continued    DISEASE/MEDICATION-SPECIFIC INFORMATION      N/A    Oncology: Is the patient receiving adequate infection prevention treatment? Not applicable  Does the patient have adequate nutritional support? Not applicable    PATIENT SPECIFIC NEEDS     Does the patient have any physical, cognitive, or cultural barriers? No    Is the patient high risk? No    Did the patient require a clinical intervention? No    Does the patient require physician intervention or other additional services (i.e., nutrition, smoking cessation, social work)? No    SOCIAL DETERMINANTS OF HEALTH     At the [facility], we have learned that life circumstances - like trouble affording food, housing, utilities, or transportation can affect the health of many of our patients.   That is why we wanted to ask: are you currently experiencing any life circumstances that are negatively impacting your health and/or quality of life? Patient declined to answer    Social Determinants of Health     Financial Resource Strain: Low Risk  (02/10/2022)    Overall Financial Resource Strain (CARDIA)     Difficulty of Paying Living Expenses: Not hard at all   Internet Connectivity: Not on file   Food Insecurity: No Food Insecurity (02/10/2022)    Hunger Vital Sign     Worried About Running Out of Food in the Last Year: Never true     Ran Out of Food in the Last Year: Never true   Tobacco Use: Medium Risk (06/03/2022)    Patient History     Smoking Tobacco Use: Former     Smokeless Tobacco Use: Never     Passive Exposure: Never   Housing/Utilities: Low Risk  (02/10/2022)    Housing/Utilities     Within the past 12 months, have you ever stayed: outside, in a car, in a tent, in an overnight shelter, or temporarily in someone else's home (i.e. couch-surfing)?: No     Are you worried about losing your housing?: No     Within the past 12 months, have you been unable to get utilities (heat, electricity) when it was really needed?: No   Alcohol Use: Not At Risk (02/23/2022)    Alcohol Use     How often do you have a drink containing alcohol?: 2 - 4 times per month  How many drinks containing alcohol do you have on a typical day when you are drinking?: 3 - 4     How often do you have 5 or more drinks on one occasion?: Never   Transportation Needs: No Transportation Needs (02/10/2022)    PRAPARE - Transportation     Lack of Transportation (Medical): No     Lack of Transportation (Non-Medical): No   Substance Use: Low Risk  (10/14/2020)    Substance Use     Taken prescription drugs for non-medical reasons: Never     Taken illegal drugs: Never     Patient indicated they have taken drugs in the past year for non-medical reasons: Yes, [positive answer(s)]: Not on file   Health Literacy: Low Risk  (10/17/2021)    Health Literacy     : Never   Physical Activity: Sufficiently Active (11/20/2020)    Exercise Vital Sign     Days of Exercise per Week: 7 days     Minutes of Exercise per Session: 40 min   Interpersonal Safety: Not at risk (06/03/2022)    Interpersonal Safety     Unsafe Where You Currently Live: No     Physically Hurt by Anyone: No     Abused by Anyone: No   Stress: No Stress Concern Present (11/20/2020)    [employer] of Occupational Health - Occupational Stress Questionnaire     Feeling of Stress : Not at all   Intimate Partner Violence: Not At Risk (02/23/2022)    Humiliation, Afraid, Rape, and Kick questionnaire     Fear of Current or Ex-Partner: No     Emotionally Abused: No     Physically Abused: No     Sexually Abused: No   Depression: Not at risk (02/23/2022)    PHQ-2     PHQ-2 Score: 0   Social Connections: Moderately Isolated (11/20/2020)    Social Connection and Isolation Panel [NHANES]     Frequency of Communication with Friends and Family: More than three times a week     Frequency of Social Gatherings with Friends and Family: More than three times a week     Attends Religious Services: 1 to 4 times per year Active Member of [employer] or Organizations: No     Attends Banker Meetings: Never     Marital Status: Separated     Would you be willing to receive help with any of the needs that you have identified today? Not applicable     SHIPPING     Specialty Medication(s) to be Shipped:   Hematology/Oncology: Nubeqa    Other medication(s) to be shipped: No additional medications requested for fill at this time     Changes to insurance: No    Delivery Scheduled: Yes, Expected medication delivery date: 06/24/22.     Medication will be delivered via Next Day Courier to the confirmed prescription address in [facility].    The patient will receive a drug information handout for each medication shipped and additional FDA Medication Guides as required.  Verified that patient has previously received a Conservation officer, historic buildings and a Surveyor, mining.    The patient or caregiver noted above participated in the development of this care plan and knows that they can request review of or adjustments to the care plan at any time.      All of the patient's questions and concerns have been addressed.    Kermit Balo, [facility]   [facility]  Pharmacy Specialty Pharmacist

## 2022-06-23 MED FILL — NUBEQA 300 MG TABLET: ORAL | 30 days supply | Qty: 120 | Fill #2

## 2022-06-28 MED FILL — MAGNESIUM OXIDE 400 MG (241.3 MG MAGNESIUM) TABLET: ORAL | 120 days supply | Qty: 120 | Fill #0

## 2022-07-04 NOTE — Unmapped (Signed)
COMPLEX CASE MANAGEMENT   Brief Note    This patient has been reviewed for Complex Case Management services and is not eligible at this time due to: Insurance no longer Hotel manager . To have this patient reevaluated for Complex Case Management please place AMB Referral for Case Management to the Personal Health Advocate Department.      Deliah Goody - High Risk Care Coordinator   [facility]  [address]  [place], [place] [address]  P: [phone] F: [phone]  [email or web address]

## 2022-07-08 ENCOUNTER — Ambulatory Visit
Admit: 2022-07-08 | Discharge: 2022-07-09 | Payer: MEDICARE | Attending: Student in an Organized Health Care Education/Training Program | Primary: Student in an Organized Health Care Education/Training Program

## 2022-07-08 DIAGNOSIS — I10 Essential (primary) hypertension: Principal | ICD-10-CM

## 2022-07-08 MED ORDER — CARVEDILOL 25 MG TABLET
ORAL_TABLET | Freq: Two times a day (BID) | ORAL | 3 refills | 90 days | Status: CP
Start: 2022-07-08 — End: 2023-07-08
  Filled 2022-07-13: qty 180, 90d supply, fill #0

## 2022-07-08 NOTE — Unmapped (Signed)
Internal Medicine Clinic Visit    Reason for visit: Follow up    A/P:         1. Primary hypertension    His blood pressure remains elevated within similar range compared to recent values. He has tolerated increased dose of carvedilol, and we will plan to increase again today. If heart rate does not tolerate this, we will discuss additional agents, though his history of angioedema to ACEi and existing lower extremity edema (venous stasis) may complicate his options.   -Continue hydrochlorothiazide 25mg  daily   -Increase carvedilol to 25mg  BID   -Encourage home BP monitoring     Return in about 3 months (around 10/06/2022).    Staffed with Dr. Lucita Ferrara, discussed    __________________________________________________________    HPI:    He has been doing well since his last visit. He has no acute concerns or complaints. He has tolerated his medications without issue and notes that his cancer pain is generally well-controlled. He has not been checking blood pressure at home.   __________________________________________________________        Medications:  Reviewed in EPIC  __________________________________________________________    Physical Exam:   Vital Signs:  Vitals:    07/08/22 1100   BP: 142/80   BP Site: L Arm   BP Position: Sitting   BP Cuff Size: Large   Pulse: 64   Temp: 35.1 ??C (95.2 ??F)   TempSrc: Temporal   SpO2: 99%   Weight: (!) 118.7 kg (261 lb 9.6 oz)   Height: 180.3 cm (5' 10.98)      PTHomeBP    The patient???s Average Home Blood Pressure during the last two weeks is :   /   based on  readings    Gen: Well appearing, in NAD, in wheelchair  CV: RRR, no murmurs  Pulm: CTA bilaterally, no crackles or wheezes  Abd: firm, non-distended, non-tender  Ext: 1+ pitting edema of BLE with compression wraps in place    PHQ-9 Score:  PHQ-9 TOTAL SCORE: 2  GAD-7 Score:  GAD-7 Total Score: 0  Medication adherence and barriers to the treatment plan have been addressed. Opportunities to optimize healthy behaviors have been discussed. Patient / caregiver voiced understanding.    Ocie Doyne, MD  [facility] Internal Medicine - PGY-3  Pager: [phone]

## 2022-07-08 NOTE — Unmapped (Addendum)
Thank you for visiting the clinic today! Here is what we discussed:    Everything looks great today!   Your blood pressure is still on the high side, so we will increase the dose of carvedilol again to 25mg .   You should keep all your other appointments as scheduled.   Please let me know if you need anything else!

## 2022-07-09 NOTE — Unmapped (Signed)
Immediately after or during the visit, I reviewed with the resident the medical history and the resident’s findings on physical examination.  I discussed with the resident the patient’s diagnosis and concur with the treatment plan as documented in the resident note. Tamina Cyphers R Iyannah Blake, MD

## 2022-07-13 ENCOUNTER — Ambulatory Visit: Admit: 2022-07-13 | Payer: MEDICARE
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
## 2022-07-21 MED FILL — ELIQUIS 5 MG TABLET: ORAL | 30 days supply | Qty: 60 | Fill #1

## 2022-07-21 MED FILL — FEROSUL 325 MG (65 MG IRON) TABLET: ORAL | 30 days supply | Qty: 15 | Fill #0

## 2022-07-26 NOTE — Unmapped (Signed)
The [facility] has made a third and final attempt to reach this patient to refill the following medication:Nubeqa.      We have left voicemails on the following phone numbers: [phone], have been unable to leave messages on the following phone numbers: [phone], have sent a text message to the following phone numbers: [phone], and have sent a Mychart questionnaire..    Dates contacted: 2/15,21,26  Last scheduled delivery: 06/23/22    The patient may be at risk of non-compliance with this medication. The patient should call the [facility] at [phone]  Option 4, then Option 1 (oncology) to refill medication.    [name] Hulda Humphrey   [facility] Specialty Technician

## 2022-08-08 ENCOUNTER — Ambulatory Visit: Admit: 2022-08-08 | Payer: MEDICARE

## 2022-08-08 ENCOUNTER — Ambulatory Visit: Admit: 2022-08-08 | Payer: MEDICARE | Attending: Nurse Practitioner | Primary: Nurse Practitioner

## 2022-08-15 NOTE — Unmapped (Signed)
Called patient to resch missed appt on 3/11. Lvm with call back number if he would like to reschedule them.

## 2022-08-16 NOTE — Unmapped (Signed)
Upon investigation, one additional attempt (4th attempt) for patient contact was warranted.  The [facility] reached out to patient on 08/16/22 and LVM as well as sent a text message to refill Nubeqa.  Last filled by [facility] on 06/23/22 x 30 day supply.Kermit Balo, [facility]  [facility] Specialty Pharmacist

## 2022-08-18 NOTE — Unmapped (Signed)
Called patient's daughter to follow up on his missed appointment with Barkley Bruns, NP.  She states that he missed his appointment because she was unable to bring him because she was sick.    Message sent to return scheduling to call daughter [name] to reschedule patient.

## 2022-08-22 DIAGNOSIS — C61 Malignant neoplasm of prostate: Principal | ICD-10-CM

## 2022-08-29 DIAGNOSIS — I10 Essential (primary) hypertension: Principal | ICD-10-CM

## 2022-08-29 MED ORDER — HYDROCHLOROTHIAZIDE 25 MG TABLET
ORAL_TABLET | Freq: Every day | ORAL | 3 refills | 90 days | Status: CP
Start: 2022-08-29 — End: 2023-07-09

## 2022-09-01 NOTE — Unmapped (Signed)
Adam Hoover,     Can you reorder that ultrasound please?

## 2022-09-06 MED FILL — POTASSIUM CHLORIDE ER 20 MEQ TABLET,EXTENDED RELEASE(PART/CRYST): ORAL | 90 days supply | Qty: 90 | Fill #1

## 2022-09-08 ENCOUNTER — Ambulatory Visit
Admit: 2022-09-08 | Discharge: 2022-09-09 | Payer: MEDICARE | Attending: Nurse Practitioner | Primary: Nurse Practitioner

## 2022-09-08 ENCOUNTER — Other Ambulatory Visit: Admit: 2022-09-08 | Discharge: 2022-09-09 | Payer: MEDICARE

## 2022-09-08 DIAGNOSIS — N179 Acute kidney failure, unspecified: Principal | ICD-10-CM

## 2022-09-08 DIAGNOSIS — C61 Malignant neoplasm of prostate: Principal | ICD-10-CM

## 2022-09-08 LAB — CBC W/ AUTO DIFF
BASOPHILS ABSOLUTE COUNT: 0 10*9/L (ref 0.0–0.1)
BASOPHILS RELATIVE PERCENT: 0.7 %
EOSINOPHILS ABSOLUTE COUNT: 0.1 10*9/L (ref 0.0–0.5)
EOSINOPHILS RELATIVE PERCENT: 2.3 %
HEMATOCRIT: 32.5 % — ABNORMAL LOW (ref 39.0–48.0)
HEMOGLOBIN: 11.1 g/dL — ABNORMAL LOW (ref 12.9–16.5)
LYMPHOCYTES ABSOLUTE COUNT: 1.1 10*9/L (ref 1.1–3.6)
LYMPHOCYTES RELATIVE PERCENT: 27.2 %
MEAN CORPUSCULAR HEMOGLOBIN CONC: 34.1 g/dL (ref 32.0–36.0)
MEAN CORPUSCULAR HEMOGLOBIN: 31.4 pg (ref 25.9–32.4)
MEAN CORPUSCULAR VOLUME: 92.1 fL (ref 77.6–95.7)
MEAN PLATELET VOLUME: 8.7 fL (ref 6.8–10.7)
MONOCYTES ABSOLUTE COUNT: 0.5 10*9/L (ref 0.3–0.8)
MONOCYTES RELATIVE PERCENT: 12.1 %
NEUTROPHILS ABSOLUTE COUNT: 2.4 10*9/L (ref 1.8–7.8)
NEUTROPHILS RELATIVE PERCENT: 57.7 %
PLATELET COUNT: 224 10*9/L (ref 150–450)
RED BLOOD CELL COUNT: 3.53 10*12/L — ABNORMAL LOW (ref 4.26–5.60)
RED CELL DISTRIBUTION WIDTH: 15.5 % — ABNORMAL HIGH (ref 12.2–15.2)
WBC ADJUSTED: 4.1 10*9/L (ref 3.6–11.2)

## 2022-09-08 LAB — COMPREHENSIVE METABOLIC PANEL
ALBUMIN: 3.5 g/dL (ref 3.4–5.0)
ALKALINE PHOSPHATASE: 80 U/L (ref 46–116)
ALT (SGPT): 9 U/L — ABNORMAL LOW (ref 10–49)
ANION GAP: 11 mmol/L (ref 5–14)
AST (SGOT): 14 U/L (ref <=34)
BILIRUBIN TOTAL: 0.3 mg/dL (ref 0.3–1.2)
BLOOD UREA NITROGEN: 51 mg/dL — ABNORMAL HIGH (ref 9–23)
BUN / CREAT RATIO: 22
CALCIUM: 9.7 mg/dL (ref 8.7–10.4)
CHLORIDE: 108 mmol/L — ABNORMAL HIGH (ref 98–107)
CO2: 19 mmol/L — ABNORMAL LOW (ref 20.0–31.0)
CREATININE: 2.32 mg/dL — ABNORMAL HIGH
EGFR CKD-EPI (2021) MALE: 29 mL/min/{1.73_m2} — ABNORMAL LOW (ref >=60)
GLUCOSE RANDOM: 98 mg/dL (ref 70–179)
POTASSIUM: 3.6 mmol/L (ref 3.5–5.1)
PROTEIN TOTAL: 7.9 g/dL (ref 5.7–8.2)
SODIUM: 138 mmol/L (ref 135–145)

## 2022-09-08 LAB — PSA: PROSTATE SPECIFIC ANTIGEN: 6.54 ng/mL — ABNORMAL HIGH (ref 0.00–4.00)

## 2022-09-08 LAB — TESTOSTERONE: TESTOSTERONE TOTAL: 26 ng/dL — ABNORMAL LOW
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
## 2022-09-08 NOTE — Unmapped (Signed)
Shot again in June (come back June 10)  Continue cancer medication for now  Follow up with your primary care provider  Will let you know your labs  Drink more water and double void (pee then go back in 15 minutes and pee again)    [name], NP-C, OCN  Adult Nurse Practitioner  Urology and Medical Oncology    Notice: Many test results will be automatically released into MyChart. This may happen before your provider has a chance to review them. Your results will either be reviewed with you at your scheduled appointment or your provider or someone from their office will reach out to you to discuss the results. Thank you for your understanding    If you have been prescribed a medication today, always read the package insert that comes with the medication.    In the event of an emergency, always call 911    Urology:  Main clinic & [place]: [phone]  Fax: [phone]    Cancer Hospital:  Phone: [phone]  Fax: [phone]    After hours/nights/weekends:  Hospital Operator: [phone]    [email or web address]  [email or web address]

## 2022-09-09 NOTE — Unmapped (Signed)
I could not lvm with patient Adam Hoover to confirm appointments on the following date(s):       Need his ultrasound scheduled STAT!! Has medicare and does not need pre-auth  Raegine Ann Held

## 2022-09-13 ENCOUNTER — Ambulatory Visit: Admit: 2022-09-13 | Discharge: 2022-09-14 | Payer: MEDICARE

## 2022-09-13 DIAGNOSIS — N179 Acute kidney failure, unspecified: Principal | ICD-10-CM

## 2022-09-13 LAB — BASIC METABOLIC PANEL
ANION GAP: 8 mmol/L (ref 5–14)
BLOOD UREA NITROGEN: 45 mg/dL — ABNORMAL HIGH (ref 9–23)
BUN / CREAT RATIO: 19
CALCIUM: 9.5 mg/dL (ref 8.7–10.4)
CHLORIDE: 108 mmol/L — ABNORMAL HIGH (ref 98–107)
CO2: 21.1 mmol/L (ref 20.0–31.0)
CREATININE: 2.37 mg/dL — ABNORMAL HIGH
EGFR CKD-EPI (2021) MALE: 28 mL/min/{1.73_m2} — ABNORMAL LOW (ref >=60)
GLUCOSE RANDOM: 100 mg/dL — ABNORMAL HIGH (ref 70–99)
POTASSIUM: 3.4 mmol/L (ref 3.4–4.8)
SODIUM: 137 mmol/L (ref 135–145)

## 2022-09-13 NOTE — Unmapped (Signed)
I spoke with [name], patient's daughter.  I explained that based on lab work that her father's kidney function is still poor.  I informed her that Barkley Bruns, NP request that patient get fluids this week and repeat BMP the next day.    [name] states that she can bring Mr. Andrus in on Friday for fluids.  Message sent to infusion to schedule patient for 1L of fluid infusion over 2 hours on this Friday 09/16/22.  Once scheduled I will make [name] aware and ask that she have her father repeat BMP on Saturday at the hospital if possible.

## 2022-09-13 NOTE — Unmapped (Signed)
[facility] Clinical Assessment & Refill Coordination Note    Adam Hoover, [place]: May 30, 1946  Phone: [phone] (home)     All above HIPAA information was verified with patient's family member, daughter.     Was a Nurse, learning disability used for this call? No    Specialty Medication(s):   Hematology/Oncology: Adam Hoover     Current Outpatient Medications   Medication Sig Dispense Refill    ammonium lactate (LAC-HYDRIN) 12 % lotion Apply 1 application. topically Two (2) times a day. 400 g 3    apixaban (ELIQUIS) 5 mg Tab Take 1 tablet by mouth two (2) times a day. 60 tablet 11    carvedilol (COREG) 25 MG tablet Take 1 tablet (25 mg total) by mouth two (2) times a day. 180 tablet 3    darolutamide (NUBEQA) 300 mg tablet Take 2 tablets (600 mg total) by mouth in the morning and 2 tablets (600 mg total) in the evening. Take with meals. Take with food. Swallow tablets whole.. 120 tablet 11    hydroCHLOROthiazide (HYDRODIURIL) 25 MG tablet Take 1 tablet (25 mg total) by mouth daily. 90 tablet 3    magnesium oxide (MAG-OX) 400 mg (241.3 mg elemental magnesium) tablet Take 1 tablet (400 mg total) by mouth nightly. 120 tablet 1    naloxone (NARCAN) 4 mg nasal spray One spray in either nostril once for known/suspected opioid overdose. May repeat every 2-3 minutes in alternating nostril til EMS arrives 2 each 0    oxyCODONE (ROXICODONE) 5 MG immediate release tablet Take 1/2 to 1 tablet (2.5-5 mg total) by mouth every eight (8) hours as needed for pain. 60 tablet 0    potassium chloride 20 MEQ ER tablet Take 1 tablet (20 mEq total) by mouth daily. 90 tablet 3     No current facility-administered medications for this visit.        Changes to medications: Adam Hoover reports no changes at this time.    Allergies   Allergen Reactions    Acetaminophen Hives     Per pt report Only, tolerated well.     Penicillins Hives    Lisinopril      Angioedema        Changes to allergies: No    SPECIALTY MEDICATION ADHERENCE Nubeqa 300 mg: 21 days of medicine on hand     Medication Adherence    Patient reported X missed doses in the last month: 0  Specialty Medication: Nubeqa 600 mg twice daily  Informant: child/children  Confirmed plan for next specialty medication refill: delivery by pharmacy  Refills needed for supportive medications: not needed          Specialty medication(s) dose(s) confirmed:  Regimen is correct.  Patient did not refill meds due to receiving additional refill from an alternate source and so had medication on hand until now.      Are there any concerns with adherence? No    Adherence counseling provided? Not needed    CLINICAL MANAGEMENT AND INTERVENTION      Clinical Benefit Assessment:    Do you feel the medicine is effective or helping your condition? Yes    Clinical Benefit counseling provided? Not needed    Adverse Effects Assessment:    Are you experiencing any side effects? No    Are you experiencing difficulty administering your medicine? No    Quality of Life Assessment:    Quality of Life    Rheumatology  Oncology  1. What impact  has your specialty medication had on the reduction of your daily pain or discomfort level?: Some  2. On a scale of 1-10, how would you rate your ability to manage side effects associated with your specialty medication? (1=no issues, 10 = unable to take medication due to side effects): 1  Dermatology  Cystic Fibrosis          How many days over the past month did your prostate cancer  keep you from your normal activities? For example, brushing your teeth or getting up in the morning. 0    Have you discussed this with your provider? Not needed    Acute Infection Status:    Acute infections noted within Epic:  No active infections  Patient reported infection: None    Therapy Appropriateness:    Is therapy appropriate and patient progressing towards therapeutic goals? Yes, therapy is appropriate and should be continued    DISEASE/MEDICATION-SPECIFIC INFORMATION N/A    Oncology: Is the patient receiving adequate infection prevention treatment? Not applicable  Does the patient have adequate nutritional support? Not applicable    PATIENT SPECIFIC NEEDS     Does the patient have any physical, cognitive, or cultural barriers? No    Is the patient high risk? No    Did the patient require a clinical intervention? No    Does the patient require physician intervention or other additional services (i.e., nutrition, smoking cessation, social work)? No    SOCIAL DETERMINANTS OF HEALTH     At the [facility], we have learned that life circumstances - like trouble affording food, housing, utilities, or transportation can affect the health of many of our patients.   That is why we wanted to ask: are you currently experiencing any life circumstances that are negatively impacting your health and/or quality of life? Patient declined to answer    Social Determinants of Health     Financial Resource Strain: Low Risk  (07/08/2022)    Overall Financial Resource Strain (CARDIA)     Difficulty of Paying Living Expenses: Not hard at all   Internet Connectivity: No Internet connectivity concern identified (07/08/2022)    Internet Connectivity     Do you have access to internet services: Yes     How do you connect to the internet: Personal Device at home     Is your internet connection strong enough for you to watch video on your device without major problems?: Yes     Do you have enough data to get through the month?: Yes     Does at least one of the devices have a camera that you can use for video chat?: Yes   Food Insecurity: No Food Insecurity (07/08/2022)    Hunger Vital Sign     Worried About Running Out of Food in the Last Year: Never true     Ran Out of Food in the Last Year: Never true   Tobacco Use: Medium Risk (07/13/2022)    Patient History     Smoking Tobacco Use: Former     Smokeless Tobacco Use: Never     Passive Exposure: Never   Housing/Utilities: Low Risk  (07/08/2022) Housing/Utilities     Within the past 12 months, have you ever stayed: outside, in a car, in a tent, in an overnight shelter, or temporarily in someone else's home (i.e. 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)    Alcohol Use     How often do you have a drink containing alcohol?: 2 - 4 times per month     How many drinks containing alcohol do you have on a typical day when you are drinking?: 5 - 6     How often do you have 5 or more drinks on one occasion?: Monthly   Transportation Needs: No Transportation Needs (07/08/2022)    PRAPARE - Transportation     Lack of Transportation (Medical): No     Lack of Transportation (Non-Medical): No   Substance Use: Medium Risk (07/08/2022)    Substance Use     Taken prescription drugs for non-medical reasons: Never     Taken illegal drugs: Once or Twice Yearly     Patient indicated they have taken drugs in the past year for non-medical reasons: Yes, [positive answer(s)]: Yes   Health Literacy: Low Risk  (07/08/2022)    Health Literacy     : Never   Physical Activity: Unknown (07/08/2022)    Exercise Vital Sign     Days of Exercise per Week: 4 days     Minutes of Exercise per Session: Not on file   Interpersonal Safety: Not at risk (07/08/2022)    Interpersonal Safety     Unsafe Where You Currently Live: No     Physically Hurt by Anyone: No     Abused by Anyone: No   Stress: No Stress Concern Present (07/08/2022)    [employer] of Occupational Health - Occupational Stress Questionnaire     Feeling of Stress : Not at all   Intimate Partner Violence: Not At Risk (07/08/2022)    Humiliation, Afraid, Rape, and Kick questionnaire     Fear of Current or Ex-Partner: No     Emotionally Abused: No     Physically Abused: No     Sexually Abused: No   Depression: Not at risk (07/08/2022)    PHQ-2     PHQ-2 Score: 0   Social Connections: Moderately Isolated (07/08/2022)    Social Connection and Isolation Panel [NHANES]     Frequency of Communication with Friends and Family: More than three times a week     Frequency of Social Gatherings with Friends and Family: Three times a week     Attends Religious Services: 1 to 4 times per year     Active Member of Clubs or Organizations: No     Attends Banker Meetings: Never     Marital Status: Separated     Would you be willing to receive help with any of the needs that you have identified today? Not applicable       SHIPPING     Specialty Medication(s) to be Shipped:   Hematology/Oncology: Nubeqa    Other medication(s) to be shipped: No additional medications requested for fill at this time     Changes to insurance: No    Delivery Scheduled: Yes, Expected medication delivery date: 09/23/22.     Medication will be delivered via Next Day Courier to the confirmed prescription address in [facility].    The patient will receive a drug information handout for each medication shipped and additional FDA Medication Guides as required.  Verified that patient has previously received a Conservation officer, historic buildings and a Surveyor, mining.    The patient or caregiver noted above participated in  the development of this care plan and knows that they can request review of or adjustments to the care plan at any time.      All of the patient's questions and concerns have been addressed.    Kermit Balo, [facility]   [facility] Specialty Pharmacist

## 2022-09-13 NOTE — Unmapped (Signed)
Adam Hoover contacted [facility] and left a message to refill Nubeqa.  [facility] has made several attempts to contact him regarding a refill assessment.  The last dispensed date for Nubeqa is 06/22/22 x 30 day supply.  I called patient back on his mobile number but had to LVM.  We will wait for patient to call back to schedule his delivery.    [name], PharmD  [facility]

## 2022-09-14 DIAGNOSIS — C61 Malignant neoplasm of prostate: Principal | ICD-10-CM

## 2022-09-16 ENCOUNTER — Ambulatory Visit: Admit: 2022-09-16 | Payer: MEDICARE

## 2022-09-16 ENCOUNTER — Ambulatory Visit: Admit: 2022-09-16 | Discharge: 2022-09-17 | Payer: MEDICARE

## 2022-09-16 DIAGNOSIS — I890 Lymphedema, not elsewhere classified: Principal | ICD-10-CM

## 2022-09-16 MED ADMIN — sodium chloride 0.9% (NS) bolus 1,000 mL: 1000 mL | INTRAVENOUS | @ 20:00:00 | Stop: 2022-09-16
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
## 2022-09-16 NOTE — Unmapped (Signed)
The Venous Access Team (VAT) received a request for PIV access.   PIV obtained to my arrival.        PIV Workup / Procedure Time:  15 minutes    Care RN was notified.     Thank you,     Oriyah Lamphear Jean Rosenthal, RN Venous Access Team

## 2022-09-17 NOTE — Unmapped (Signed)
RED ZONE Means: RED ZONE: Take action now!     You need to be seen right away  Symptoms are at a severe level of discomfort    Call 911 or go to your nearest  Hospital for help     - Bleeding that will not stop    - Hard to breathe    - New seizure - Chest pain  - Fall or passing out  -Thoughts of hurting    yourself or others      Call 911 if you are going into the RED ZONE                  YELLOW ZONE Means:     Please call with any new or worsening symptom(s), even if not on this list.  Call [phone]  After hours, weekends, and holidays - you will reach a long recording with specific instructions, If not in an emergency such as above, please listen closely all the way to the end and choose the option that relates to your need.   You can be seen by a provider the same day through our Same Day Acute Care for Patients with Cancer program.      YELLOW ZONE: Take action today     Symptoms are new or worsening  You are not within your goal range for:    - Pain    - Shortness of breath    - Bleeding (nose, urine, stool, wound)    - Feeling sick to your stomach and throwing up    - Mouth sores/pain in your mouth or throat    - Hard stool or very loose stools (increase in       ostomy output)    - No urine for 12 hours    - Feeding tube or other catheter/tube issue    - Redness or pain at previous IV or port/catheter site    - Depressed or anxiety   - Swelling (leg, arm, abdomen,     face, neck)  - Skin rash or skin changes  - Wound issues (redness, drainage,    re-opened)  - Confusion  - Vision changes  - Fever >100.4 F or chills  - Worsening cough with mucus that is    green, yellow, or bloody  - Pain or burning when going to the    bathroom  - Home Infusion Pump Issue- call    [phone]         Call your healthcare provider if you are going into the YELLOW ZONE     GREEN ZONE Means:  Your symptoms are under controls  Continue to take your medicine as ordered  Keep all visits to the provider GREEN ZONE: You are in control  No increase or worsening symptoms  Able to take your medicine  Able to drink and eat    - DO NOT use MyChart messages to report red or yellow symptoms. Allow up to 3    business days for a reply.  -MyChart is for non-urgent medication refills, scheduling requests, or other general questions.         [id number] Rev. 11/26/2021  Approved by Oncology Patient Education Committee

## 2022-09-17 NOTE — Unmapped (Signed)
Patient presented for iv fluids today. Patient denied any pain, shortness of breath or nausea during treatment. Patient was stable at time of discharge; was escorted to lobby, via wheelchair, by family.

## 2022-09-19 ENCOUNTER — Ambulatory Visit: Admit: 2022-09-19 | Discharge: 2022-09-20 | Payer: MEDICARE

## 2022-09-19 LAB — BASIC METABOLIC PANEL
ANION GAP: 11 mmol/L (ref 5–14)
BLOOD UREA NITROGEN: 48 mg/dL — ABNORMAL HIGH (ref 9–23)
BUN / CREAT RATIO: 22
CALCIUM: 9.4 mg/dL (ref 8.7–10.4)
CHLORIDE: 107 mmol/L (ref 98–107)
CO2: 20 mmol/L (ref 20.0–31.0)
CREATININE: 2.23 mg/dL — ABNORMAL HIGH
EGFR CKD-EPI (2021) MALE: 30 mL/min/{1.73_m2} — ABNORMAL LOW (ref >=60)
GLUCOSE RANDOM: 95 mg/dL (ref 70–179)
POTASSIUM: 3.3 mmol/L — ABNORMAL LOW (ref 3.4–4.8)
SODIUM: 138 mmol/L (ref 135–145)

## 2022-09-20 DIAGNOSIS — N1832 Stage 3b chronic kidney disease (CMS-HCC): Principal | ICD-10-CM

## 2022-09-20 DIAGNOSIS — N179 Acute kidney failure, unspecified: Principal | ICD-10-CM

## 2022-09-21 NOTE — Unmapped (Signed)
Call patient's daughter and advised that Girtha Rm, NP has placed a referral for patient to see nephrologist.  I explained that someone from nephology scheduling would be calling them to schedule.  Patient's daughter voiced understanding.

## 2022-09-23 MED FILL — NUBEQA 300 MG TABLET: ORAL | 30 days supply | Qty: 120 | Fill #3

## 2022-10-03 DIAGNOSIS — N1832 Stage 3b chronic kidney disease (CMS-HCC): Principal | ICD-10-CM

## 2022-10-03 NOTE — Unmapped (Signed)
[facility] nephrology to schedule patient.  They will call patient with an appointment once the providers schedules are put out.

## 2022-10-04 DIAGNOSIS — N179 Acute kidney failure, unspecified: Principal | ICD-10-CM

## 2022-10-04 DIAGNOSIS — N1832 Stage 3b chronic kidney disease (CMS-HCC): Principal | ICD-10-CM

## 2022-10-04 NOTE — Unmapped (Signed)
Thank you for your e-Consult.    To summarize: Adam Hoover is a 77 y.o. 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 Briefly, the patient's creatinine started to rise most notably late in 2023, but on review of his creatinine trend over the last several years, his baseline kidney function has been slowly worsening with intermittent episodes of AKI. Late in 2023 he had an episode of swelling and increased shortness of breath associated with worsening creatinine, although this later improved.     He has been evaluated with kidney ultrasound and given additional IV fluids in the oncology infusion center. His ultrasound was not remarkable for any obstruction. His creatinine did not improve with fluids significantly. He does not appear to have been started on any new medications in the last 6 months, although he has been increasing his antihypertensive regimen for better control. Overall, he is not on a long list of concerning medications from the standpoint of his kidney function. His prostate cancer therapy is not noted to have significant kidney-related side effects.         My recommendations are as follows:   I suspect that based on his creatinine trend above, this is likely progression of his underlying CKD. However, there may be factors contributing to this rise over the last year in particular that have not yet been evaluated.     Careful history for OTC medications given his known cancer pain and allergy to Tylenol - would ask specifically about NSAIDs including Goody Powder/BC powder and any supplements, etc.   Urinalysis and urine albumin/creatinine ratio  Evaluation in the nephrology clinic, at minimum for evaluation of urine sediment and further CKD management.     I spent 5-10 minutes in medical consultative discussion and review of medical records, including a written report to the treating provider via electronic health record regarding the condition of this patient.    This e-Consult did include an answerable clinical question and did recommend a clinic visit.    Ernestina Columbia, MD    The recommendations provided in this eConsult are based on the clinical data available to me and are furnished without the benefit of a comprehensive in-person evaluation of the patient. Any new clinical issues or changes in patient status not available to me will need to be taken into account when assessing these recommendations. The ongoing management of this patient is the responsibility of the referring clinician. Please contact me if you have further questions.

## 2022-10-14 ENCOUNTER — Ambulatory Visit
Admit: 2022-10-14 | Discharge: 2022-10-15 | Payer: MEDICARE | Attending: Student in an Organized Health Care Education/Training Program | Primary: Student in an Organized Health Care Education/Training Program

## 2022-10-14 DIAGNOSIS — N179 Acute kidney failure, unspecified: Principal | ICD-10-CM

## 2022-10-14 DIAGNOSIS — N1832 Stage 3b chronic kidney disease (CMS-HCC): Principal | ICD-10-CM

## 2022-10-14 LAB — BASIC METABOLIC PANEL
ANION GAP: 7 mmol/L (ref 5–14)
BLOOD UREA NITROGEN: 40 mg/dL — ABNORMAL HIGH (ref 9–23)
BUN / CREAT RATIO: 17
CALCIUM: 9.5 mg/dL (ref 8.7–10.4)
CHLORIDE: 110 mmol/L — ABNORMAL HIGH (ref 98–107)
CO2: 22.7 mmol/L (ref 20.0–31.0)
CREATININE: 2.3 mg/dL — ABNORMAL HIGH
EGFR CKD-EPI (2021) MALE: 29 mL/min/{1.73_m2} — ABNORMAL LOW (ref >=60)
GLUCOSE RANDOM: 75 mg/dL (ref 70–179)
POTASSIUM: 3.3 mmol/L — ABNORMAL LOW (ref 3.4–4.8)
SODIUM: 140 mmol/L (ref 135–145)

## 2022-10-14 LAB — URINALYSIS WITH MICROSCOPY
BILIRUBIN UA: NEGATIVE
BLOOD UA: NEGATIVE
GLUCOSE UA: NEGATIVE
HYALINE CASTS: 4 /LPF — ABNORMAL HIGH (ref 0–1)
KETONES UA: NEGATIVE
NITRITE UA: NEGATIVE
PH UA: 6 (ref 5.0–9.0)
PROTEIN UA: NEGATIVE
RBC UA: 3 /HPF — ABNORMAL HIGH (ref <3)
SPECIFIC GRAVITY UA: 1.015 (ref 1.005–1.030)
SQUAMOUS EPITHELIAL: 5 /HPF (ref 0–5)
UROBILINOGEN UA: 0.2
WBC UA: >100 /HPF — ABNORMAL HIGH (ref <2)

## 2022-10-14 LAB — PROTEIN / CREATININE RATIO, URINE
CREATININE, URINE: 119.3 mg/dL
PROTEIN URINE: 43 mg/dL
PROTEIN/CREAT RATIO, URINE: 0.36

## 2022-10-14 MED ORDER — CARVEDILOL 25 MG TABLET
ORAL_TABLET | Freq: Two times a day (BID) | ORAL | 3 refills | 90 days | Status: CP
Start: 2022-10-14 — End: 2023-10-14
  Filled 2022-10-14: qty 180, 90d supply, fill #0

## 2022-10-14 MED ORDER — APIXABAN 5 MG TABLET
ORAL_TABLET | Freq: Two times a day (BID) | ORAL | 11 refills | 30 days | Status: CP
Start: 2022-10-14 — End: ?
  Filled 2022-10-14: qty 60, 30d supply, fill #0

## 2022-10-14 NOTE — Unmapped (Signed)
Thank you for visiting the clinic today! Here is what we discussed:    It was great seeing you! I'm glad your symptoms have been well-controlled lately.   We will do some more investigation into your kidney numbers as they have been elevated in the last few months. I will follow these results up with you through MyChart.   You should hold off on taking hydrochlorothiazide and should work on staying well hydrated.

## 2022-10-14 NOTE — Unmapped (Signed)
[facility] Internal Medicine at [facility]     Reason for visit: Follow up.    Questions / Concerns that need to be addressed: Routine check.        Diabetes:  Regularly checking blood sugars?:   If yes, when? Complete log for past 7 days  Date Before Breakfast After Breakfast Before Lunch After Lunch Before Dinner After Dinner Before Bed                                                                                                                                     Hypertension:  Have blood pressure cuff at home?: no  Regularly checking blood pressure?: no  If patient has a  record of recent blood pressures, please enter into flowsheets using this link     PTHomeBP          Omron BPs (complete if screening BP has a systolic  > 129 or diastolic > 79)  BP#1    BP#2   BP#3     Average BP   (please note this as a comment in vitals)       Allergies reviewed: Yes    Medication reviewed: Yes  Pended refills? Yes        HCDM reviewed and updated in Epic:    We are working to make sure all of our patients??? wishes are updated in Epic and part of that is documenting a Environmental health practitioner for each patient  A Health Care Decision Maker is someone you choose who can make health care decisions for you if you are not able - who would you most want to do this for you????  was updated.        BPAs completed:        COVID-19 Vaccine Summary  Which COVID-19 Vaccine was administered  Pfizer  Type:  Dates Given:                   If no: Are you interested in scheduling?     Immunization History   Administered Date(s) Administered    COVID-19 VAC,BIVALENT,MODERNA(BLUE CAP) 04/02/2021    COVID-19 VACC,MRNA,(PFIZER)(PF) 11/20/2020    COVID-19 VACCINE,MRNA(MODERNA)(PF) 07/02/2019, 07/30/2019, 05/18/2020    DTaP / IPV 01/28/2013    INFLUENZA QUAD ADJUVANTED 67YR UP(FLUAD) 02/19/2021, 05/18/2022    Influenza Vaccine Quad(IM)6 MO-Adult(PF) 03/18/2016, 03/22/2017, 03/20/2018, 02/27/2019    Influenza Virus Vaccine, unspecified formulation 05/31/2015, 03/11/2019    PNEUMOCOCCAL POLYSACCHARIDE 23-VALENT 04/13/2016, 04/08/2019    Pneumococcal Conjugate 13-Valent 06/30/2014    TdaP 08/20/2011       __________________________________________________________________________________________    SCREENINGS COMPLETED IN FLOWSHEETS    HARK Screening       AUDIT       PHQ2       PHQ9          Link for Multi-language PHQ/GAD screeners: [email or web address].phqscreeners.com/select-screener    P4 Suicidality Screener  GAD7       COPD Assessment       Falls Risk       .imcres

## 2022-10-14 NOTE — Unmapped (Signed)
Internal Medicine Clinic Visit    Reason for visit: Follow up     A/P:         1. Stage 3b chronic kidney disease (CMS-HCC)    2. AKI (acute kidney injury) (CMS-HCC)      1.  AKI on Stage 3b chronic kidney disease (CMS-HCC) -  He has a baseline creatinine of about 1.5-1.7 in the past year, though he has had intermittent AKIs over the years as well. He is asymptomatic from this regard and his work up has included a renal ultrasound which was notable for mildly atrophic/echogenic right kidney and unremarkable left kidney with no evidence of hydronephrosis. His medication list includes hydrochlorothiazide which has been held but no other obvious offending agents. We will broaden his work up to include urinalysis, UPCR, and repeat BMP today. Given lack of symptoms we will hold off on additional testing pending these initial labs and if bleeding is prominent we can further explore a nephritis evaluation. He has had eConsult with nephrology and will establish with them going forward.   -Hold hydrochlorothiazide 25mg  daily (removed from med list)  -Check: BMP  -Check: Urinalysis, urine protein/creatinine ratio     Return in about 6 weeks (around 11/25/2022).    Staffed with Dr. Marin Roberts, discussed    __________________________________________________________    HPI:    He presents today for routine follow up. He has been following with his oncology team as scheduled and doing well from their standpoint. He has had elevated creatinine of unclear etiology over the past few months. He does not have any flank pain, hematuria, dysuria, or changes in urinary frequency or other characteristics. He has had an ultrasound of the kidneys but has not seen nephrology.   __________________________________________________________    Medications:  Reviewed in EPIC  __________________________________________________________    Physical Exam:   Vital Signs:  Vitals:    10/14/22 1049   BP: 116/68   Pulse: 64   Resp: 16   Temp: 36.4 ??C (97.5 ??F)   TempSrc: Temporal   SpO2: 100%   Weight: (!) 117.8 kg (259 lb 9.6 oz)   Height: 180.3 cm (5' 11)      PTHomeBP    The patient???s Average Home Blood Pressure during the last two weeks is :   /   based on  readings    Gen: Well appearing, sitting in clinic wheelchair, in NAD, daughter accompanying him  CV: RRR, no murmurs  Pulm: CTA bilaterally, no crackles or wheezes  Abd: Soft, NTND, normal BS, no CVA tenderness  Ext: Stable lower extremity edema, bandaged    PHQ-9 Score:     GAD-7 Score:       Medication adherence and barriers to the treatment plan have been addressed. Opportunities to optimize healthy behaviors have been discussed. Patient / caregiver voiced understanding.    Ocie Doyne, MD  [facility] Internal Medicine - PGY-3  Pager: [phone]

## 2022-10-27 NOTE — Unmapped (Signed)
[facility] Refill Coordination Note    Specialty Medication(s) to be Shipped:   Hematology/Oncology: Nubeqa    Other medication(s) to be shipped: No additional medications requested for fill at this time     Adam Hoover, DOB: 02-11-1946  Phone: [phone] (home)       All above HIPAA information was verified with patient's family member, Child.     Was a Nurse, learning disability used for this call? No    Completed refill call assessment today to schedule patient's medication shipment from the [facility] [phone]).  All relevant notes have been reviewed.     Specialty medication(s) and dose(s) confirmed: Regimen is correct and unchanged.   Changes to medications: [name] reports no changes at this time.  Changes to insurance: No  New side effects reported not previously addressed with a pharmacist or physician: None reported  Questions for the pharmacist: No    Confirmed patient received a Conservation officer, historic buildings and a Surveyor, mining with first shipment. The patient will receive a drug information handout for each medication shipped and additional FDA Medication Guides as required.       DISEASE/MEDICATION-SPECIFIC INFORMATION        N/A    SPECIALTY MEDICATION ADHERENCE     Medication Adherence    Patient reported X missed doses in the last month: 0  Specialty Medication: Nubeqa 300 mg  Patient is on additional specialty medications: No  Informant: child/children     Were doses missed due to medication being on hold? No    Nubeqa 300mg : 10 days of medicine on hand        REFERRAL TO PHARMACIST     Referral to the pharmacist: Not needed      [facility]     Shipping address confirmed in Epic.     Delivery Scheduled: Yes, Expected medication delivery date: 11/03/22 .     Medication will be delivered via Next Day Courier to the prescription address in Epic [place].    [name]   [facility] Specialty Technician

## 2022-11-02 MED FILL — NUBEQA 300 MG TABLET: ORAL | 30 days supply | Qty: 120 | Fill #4

## 2022-11-05 DIAGNOSIS — C61 Malignant neoplasm of prostate: Principal | ICD-10-CM

## 2022-11-07 ENCOUNTER — Other Ambulatory Visit: Admit: 2022-11-07 | Discharge: 2022-11-07 | Payer: MEDICARE

## 2022-11-07 ENCOUNTER — Ambulatory Visit: Admit: 2022-11-07 | Discharge: 2022-11-07 | Payer: MEDICARE

## 2022-11-07 ENCOUNTER — Institutional Professional Consult (permissible substitution): Admit: 2022-11-07 | Discharge: 2022-11-07 | Payer: MEDICARE

## 2022-11-07 ENCOUNTER — Ambulatory Visit
Admit: 2022-11-07 | Discharge: 2022-11-07 | Payer: MEDICARE | Attending: Nurse Practitioner | Primary: Nurse Practitioner

## 2022-11-07 LAB — COMPREHENSIVE METABOLIC PANEL
ALBUMIN: 3.3 g/dL — ABNORMAL LOW (ref 3.4–5.0)
ALKALINE PHOSPHATASE: 85 U/L (ref 46–116)
ALT (SGPT): <7 U/L — ABNORMAL LOW (ref 10–49)
ANION GAP: 13 mmol/L (ref 5–14)
AST (SGOT): 13 U/L (ref <=34)
BILIRUBIN TOTAL: 0.4 mg/dL (ref 0.3–1.2)
BLOOD UREA NITROGEN: 39 mg/dL — ABNORMAL HIGH (ref 9–23)
BUN / CREAT RATIO: 18
CALCIUM: 9.3 mg/dL (ref 8.7–10.4)
CHLORIDE: 112 mmol/L — ABNORMAL HIGH (ref 98–107)
CO2: 16 mmol/L — ABNORMAL LOW (ref 20.0–31.0)
CREATININE: 2.13 mg/dL — ABNORMAL HIGH
EGFR CKD-EPI (2021) MALE: 32 mL/min/{1.73_m2} — ABNORMAL LOW (ref >=60)
GLUCOSE RANDOM: 101 mg/dL (ref 70–179)
POTASSIUM: 4 mmol/L (ref 3.5–5.1)
PROTEIN TOTAL: 7.3 g/dL (ref 5.7–8.2)
SODIUM: 141 mmol/L (ref 135–145)

## 2022-11-07 LAB — CBC W/ AUTO DIFF
BASOPHILS ABSOLUTE COUNT: 0 10*9/L (ref 0.0–0.1)
BASOPHILS RELATIVE PERCENT: 0.2 %
EOSINOPHILS ABSOLUTE COUNT: 0.1 10*9/L (ref 0.0–0.5)
EOSINOPHILS RELATIVE PERCENT: 2 %
HEMATOCRIT: 31.6 % — ABNORMAL LOW (ref 39.0–48.0)
HEMOGLOBIN: 10.4 g/dL — ABNORMAL LOW (ref 12.9–16.5)
LYMPHOCYTES ABSOLUTE COUNT: 0.9 10*9/L — ABNORMAL LOW (ref 1.1–3.6)
LYMPHOCYTES RELATIVE PERCENT: 19.3 %
MEAN CORPUSCULAR HEMOGLOBIN CONC: 33.1 g/dL (ref 32.0–36.0)
MEAN CORPUSCULAR HEMOGLOBIN: 30.7 pg (ref 25.9–32.4)
MEAN CORPUSCULAR VOLUME: 92.9 fL (ref 77.6–95.7)
MEAN PLATELET VOLUME: 8.5 fL (ref 6.8–10.7)
MONOCYTES ABSOLUTE COUNT: 0.5 10*9/L (ref 0.3–0.8)
MONOCYTES RELATIVE PERCENT: 10.7 %
NEUTROPHILS ABSOLUTE COUNT: 3 10*9/L (ref 1.8–7.8)
NEUTROPHILS RELATIVE PERCENT: 67.8 %
PLATELET COUNT: 236 10*9/L (ref 150–450)
RED BLOOD CELL COUNT: 3.4 10*12/L — ABNORMAL LOW (ref 4.26–5.60)
RED CELL DISTRIBUTION WIDTH: 15.4 % — ABNORMAL HIGH (ref 12.2–15.2)
WBC ADJUSTED: 4.5 10*9/L (ref 3.6–11.2)

## 2022-11-07 LAB — PSA: PROSTATE SPECIFIC ANTIGEN: 6.42 ng/mL — ABNORMAL HIGH (ref 0.00–4.00)

## 2022-11-07 MED ORDER — OXYCODONE 5 MG TABLET
ORAL_TABLET | Freq: Three times a day (TID) | ORAL | 0 refills | 20 days | Status: CP | PRN
Start: 2022-11-07 — End: ?
  Filled 2022-11-07: qty 60, 20d supply, fill #0

## 2022-11-07 MED ADMIN — leuprolide acetate (6 month) (ELIGARD) injection 45 mg: 45 mg | SUBCUTANEOUS | @ 16:00:00 | Stop: 2022-11-07

## 2022-11-07 NOTE — Unmapped (Addendum)
OUTPATIENT ONCOLOGY PALLIATIVE CARE    Principal Diagnosis: Adam Hoover is a 77 y.o. 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 Complicated by co-morbid acute and chronic conditions including low back pain, acute on chronic kidney disease, PE, tracheal stenosis, alcohol use disorder, and chronic dilated transverse colon.     Assessment/Plan:     # Pain: Bilateral knee and leg pain due to lymphedema. Hx of back pain.  Rare use-last prescription for oxycodone 60 tablets was written in December 2023.  - Continue oxycodone 2.5-5 mg every 8 hours for pain or shortness of breath.  Prescription sent today.  - No PO NSAIDs given CKD and cardiac hx-not using  - No APAP given allergy    # Leg edema-lymphedema-still present-remains 3+ edema extending up to thighs. In the process of trying to get compression hose.  Lymphedema affects both his functional status and pain.  -Order placed to physical therapy lymphedema clinic for compression Velcro support stockings     # ACP:   -Confirmed DNR/DNI. They think the yellow form is in the home. If not, I can send a form to them via the mail.    -At prior visits: Completed MOST and DNR/DNI form on 05/09/2022  -Limited medical interventions, antibiotics if needed, IV fluids if indicated and no feeding tube.  -Wants to have peace in his life and his illness not to be a hardship for his daughter.    Primary value is independence. Does have the prepare for your care document.    Confirmed Health Care Decision Maker as of 11/07/2022    HCDM (patient stated preference): Azmir, [place] - Daughter - [phone]         #Controlled substances risk management:  Patient has a signed pain medication agreement with Outpatient Palliative Care, completed on 11/02/20, as per standard of care.  NCCSRS database was reviewed today and it was appropriate.  Urine drug screen was not performed at this visit. Findings: not applicable.  Patient has received information about safe storage and administration of medications.  Patient has received a prescription for narcan; is not applicable.     F/u: 3 months-clinic    ----------------------------------------  Referring Provider: Dr. 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     Current cancer-directed therapy:  Nubeqa     Symptom Review: Initial visit  General: Overall slowly feeling better since being discharged from the hospital  Pain: Pain generally well controlled right now. Tightness in legs is his biggest concern. Taking MS Contin 30mg  every 12 hours. Taking Oxycodone 5mg  as needed. Over the last 2 days he has only taken one oxycodone. Has 10 tabs left.   Fatigue: Some days he feels slugish but other days feels ok. Feels like he has to take his time with getting things done. But is still able to do some work in the yard and do some walking.    Mobility: Independent  Sleep: Good  Appetite: Stable, has not lost significant weight.  Thinks he has gained weight since discharge.   Nausea: None   Bowel function: Having BMs every day. Has been using Miralax every other day. Not taking senna currenlty.   Dyspnea: Mild with exertion, none at rest.    Secretions: None  Mood: Ok overall.     Interval hx 11/07/2022 CK, [name] and daughter Adam Hoover      Symptom Review:  General:  ok-leg edema is about the same.  Pain: Primary pain continues to be in bilateral knees and legs.  Rare use of oxycodone 5 mg taking on average 1 to 2 tablets/week.  Not using any NSAIDs or Tylenol.  Fatigue: Sometimes has fatigue-energy overall okay.  Still able to live independently  Mobility: Uses a wheelchair when comes into [facility]  Sleep: Okay  Appetite: Okay  Nausea:   Bowel function: No issues with constipation or diarrhea.  Dyspnea: Stable-cough is about the same  Mood: Okay.  Last month was somewhat rough due to reflecting on his mother's birthday.      Palliative Performance Scale: 70% - Ambulation: Reduced / unable to do normal work, some evidence of disease / Self-Care: Full / Intake: Normal or reduced / Level of Conscious: Full    Coping/Support Issues: Patient is close to his daughter Adam Hoover, and likes to think of the positive side of things, accept responsibility for his role in his illness, and take things day by day.     Goals of Care: Cancer-directed therapy: durolutamide    Social History: Lives alone (daughter lives 15 minutes away); Worked as a Nutritional therapist.  Christian affiliation.  Reads the Bible  Name of primary support: Daughter Adam Hoover  Hobbies: Cooking, yardwork  Current residence / distance from [place]: [place]      Objective     Allergies:   Allergies   Allergen Reactions    Acetaminophen Hives     Per pt report Only, tolerated well.     Penicillins Hives    Lisinopril      Angioedema        Family History:  Cancer-related family history includes Cancer in his father.  He indicated that his mother is deceased. He indicated that his father is deceased. He indicated that all of his three brothers are alive. He indicated that his daughter is alive. He indicated that the status of his neg hx is unknown.      REVIEW OF SYSTEMS:  A comprehensive review of 14 systems was negative except for pertinent positives noted in HPI.    Vital signs for this encounter: VS reviewed in EPIC.  GEN: Awake and alert & in no acute distress  PSYCH: Alert and oriented to person, place and time. Euthymic.  HEENT: Pupils equally round without scleral icterus. No facial asymmetry.  CV: Regular rhythm with normal rate, no murmurs.  LUNGS: clear  ABD: Distended  EXT: 3+ BLE edema noted of the lower extremities  NEURO: Nonfocal        Lab Results   Component Value Date    CREATININE 2.30 (H) 10/14/2022     Lab Results   Component Value Date    ALKPHOS 80 09/08/2022    BILITOT 0.3 09/08/2022    BILIDIR 0.20 08/11/2021    PROT 7.9 09/08/2022    ALBUMIN 3.5 09/08/2022    ALT 9 (L) 09/08/2022    AST 14 09/08/2022       Pam Drown, FNP-BC, [facility]  Outpatient Oncology Palliative Care Service  [facility]  [address] [place], [place] [address]  [phone]     I personally spent 30 minutes face-to-face and non-face-to-face in the care of this patient, which includes all pre, intra, and post visit time on the date of service.  All documented time was specific to the E/M visit and does not include any procedures that may have been performed.
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
## 2022-11-18 ENCOUNTER — Ambulatory Visit: Admit: 2022-11-18 | Payer: MEDICARE

## 2022-11-18 DIAGNOSIS — I890 Lymphedema, not elsewhere classified: Principal | ICD-10-CM

## 2022-11-18 NOTE — Unmapped (Signed)
Order for compression garment faxed to:    [facility] (Ready to wear and custom, Medicaid)   [address] [place], [place] [address]   Phone: [phone]   Fax: [phone]   Contact: Delfina Redwood; [email or web address]   9-5 M-F (closed Sat/Sun)

## 2022-11-22 ENCOUNTER — Ambulatory Visit: Admit: 2022-11-22 | Discharge: 2022-11-23 | Payer: MEDICARE

## 2022-11-22 DIAGNOSIS — I10 Essential (primary) hypertension: Principal | ICD-10-CM

## 2022-11-22 DIAGNOSIS — Z86711 Personal history of pulmonary embolism: Principal | ICD-10-CM

## 2022-11-22 NOTE — Unmapped (Signed)
Jillene Bucks, DMSc, PA-C    Assessment/Plan:     Body mass index is 36.96 kg/m??.    DOAC: Eliquis 5 mg twice daily    1.  History of PE-current creatinine clearance 51, serum creatinine greater than 1.5 however we will continue Eliquis 5 mg twice daily for now given weight and age.  Given his cancer diagnosis, we will continue anticoagulation until instructed otherwise by heme-onc.  Remainder of his labs from 11/07/2022 are otherwise nonproblematic from a DOAC standpoint.  Reassess 6 months.  2.  Hypertension-above goal today, was at goal in May and April of this year.  HCTZ was discontinued months ago secondary to declining CKD.  Has PCP follow-up in the near future.    Follow up: 6 months    Subject/Objective:     Patient: Adam Hoover 77 y.o.    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 topically Two (2) times a day. 400 g 3    apixaban (ELIQUIS) 5 mg Tab Take 1 tablet by mouth two (2) times a day. 60 tablet 11    carvedilol (COREG) 25 MG tablet Take 1 tablet (25 mg total) by mouth two (2) times a day. 180 tablet 3    darolutamide (NUBEQA) 300 mg tablet Take 2 tablets (600 mg total) by mouth in the morning and 2 tablets (600 mg total) in the evening. Take with meals. Take with food. Swallow tablets whole.. 120 tablet 11    magnesium oxide (MAG-OX) 400 mg (241.3 mg elemental magnesium) tablet Take 1 tablet (400 mg total) by mouth nightly. 120 tablet 1    naloxone (NARCAN) 4 mg nasal spray One spray in either nostril once for known/suspected opioid overdose. May repeat every 2-3 minutes in alternating nostril til EMS arrives 2 each 0    oxyCODONE (ROXICODONE) 5 MG immediate release tablet Take 1/2 to 1 tablet (2.5-5 mg total) by mouth every eight (8) hours as needed for pain. 60 tablet 0    potassium chloride 20 MEQ ER tablet Take 1 tablet (20 mEq total) by mouth daily. 90 tablet 3     No current facility-administered medications on file prior to visit.        General:  Well appearing, no acute distress    Signs/Symptoms bleeding or bruising: no    Signs/Symptoms of Potential Embolic Events: no       Medications reviewed including OTC and herbals:yes  The patient set a personal goal for anticoagulation management today:Take warfarin daily as recommended.    Medication adherence and barriers to the treatment plan have been addressed. Opportunities to optimize healthy behaviors have been discussed. Patient / caregiver voiced understanding.      Additional History:       Note - This record has been created using [employer]. Chart creation errors have been sought, but may not always have been located. Such creation errors do not reflect on the standard of medical care.

## 2022-11-22 NOTE — Unmapped (Signed)
INR    1.1        SEC    13.5

## 2022-11-22 NOTE — Unmapped (Signed)
[facility] at [facility]     Type of visit: face to face    Are you located in [place]? (for virtual visits only) Yes    Reason for visit: Follow up          [employer] BPs (complete if screening BP has a systolic  > 130 or diastolic > 80)  BP#1 150/87 61  BP#2  152/86 53  BP#3  142/86 62    Average BP  148/86 62  (please note this as a comment in vitals)     HCDM reviewed and updated in Epic:    We are working to make sure all of our patients??? wishes are updated in Epic and part of that is documenting a Environmental health practitioner for each patient  A Health Care Decision Maker is someone you choose who can make health care decisions for you if you are not able - who would you most want to do this for you????  is already up to date.    HCDM (patient stated preference): Adam Hoover, Adam Hoover - Daughter - [phone]    BPAs completed:        Immunization History   Administered Date(s) Administered    COVID-19 VAC,BIVALENT,MODERNA(BLUE CAP) 04/02/2021    COVID-19 VACC,MRNA,(PFIZER)(PF) 11/20/2020    COVID-19 VACCINE,MRNA(MODERNA)(PF) 07/02/2019, 07/30/2019, 05/18/2020    DTaP / IPV 01/28/2013    INFLUENZA QUAD ADJUVANTED 80YR UP(FLUAD) 02/19/2021, 05/18/2022    Influenza Vaccine Quad(IM)6 MO-Adult(PF) 03/18/2016, 03/22/2017, 03/20/2018, 02/27/2019    Influenza Virus Vaccine, unspecified formulation 05/31/2015, 03/11/2019    PNEUMOCOCCAL POLYSACCHARIDE 23-VALENT 04/13/2016, 04/08/2019    Pneumococcal Conjugate 13-Valent 06/30/2014    TdaP 08/20/2011       __________________________________________________________________________________________    SCREENINGS COMPLETED IN FLOWSHEETS    HARK Screening       AUDIT       PHQ2       PHQ9          P4 Suicidality Screener                GAD7       COPD Assessment       Falls Risk

## 2022-12-02 ENCOUNTER — Ambulatory Visit: Admit: 2022-12-02 | Discharge: 2022-12-02 | Payer: MEDICARE

## 2022-12-02 DIAGNOSIS — Z86711 Personal history of pulmonary embolism: Principal | ICD-10-CM

## 2022-12-02 DIAGNOSIS — N1832 Stage 3b chronic kidney disease (CMS-HCC): Principal | ICD-10-CM

## 2022-12-02 DIAGNOSIS — I10 Essential (primary) hypertension: Principal | ICD-10-CM

## 2022-12-02 DIAGNOSIS — R601 Generalized edema: Principal | ICD-10-CM

## 2022-12-02 DIAGNOSIS — R140 Abdominal distension (gaseous): Principal | ICD-10-CM

## 2022-12-02 LAB — COMPREHENSIVE METABOLIC PANEL
ALBUMIN: 3.6 g/dL (ref 3.4–5.0)
ALKALINE PHOSPHATASE: 93 U/L (ref 46–116)
ALT (SGPT): <7 U/L — ABNORMAL LOW (ref 10–49)
ANION GAP: 10 mmol/L (ref 5–14)
AST (SGOT): 13 U/L (ref <=34)
BILIRUBIN TOTAL: 0.2 mg/dL — ABNORMAL LOW (ref 0.3–1.2)
BLOOD UREA NITROGEN: 22 mg/dL (ref 9–23)
BUN / CREAT RATIO: 12
CALCIUM: 9.6 mg/dL (ref 8.7–10.4)
CHLORIDE: 112 mmol/L — ABNORMAL HIGH (ref 98–107)
CO2: 18.2 mmol/L — ABNORMAL LOW (ref 20.0–31.0)
CREATININE: 1.9 mg/dL — ABNORMAL HIGH
EGFR CKD-EPI (2021) MALE: 37 mL/min/{1.73_m2} — ABNORMAL LOW (ref >=60)
GLUCOSE RANDOM: 104 mg/dL (ref 70–179)
POTASSIUM: 3.4 mmol/L (ref 3.4–4.8)
PROTEIN TOTAL: 7.8 g/dL (ref 5.7–8.2)
SODIUM: 140 mmol/L (ref 135–145)

## 2022-12-02 LAB — B-TYPE NATRIURETIC PEPTIDE: B-TYPE NATRIURETIC PEPTIDE: 50.9 pg/mL (ref <=100)

## 2022-12-02 MED FILL — POTASSIUM CHLORIDE ER 20 MEQ TABLET,EXTENDED RELEASE(PART/CRYST): ORAL | 90 days supply | Qty: 90 | Fill #2

## 2022-12-02 NOTE — Unmapped (Signed)
Patient states he drinks when he needs to or wants

## 2022-12-02 NOTE — Unmapped (Signed)
[facility] at [facility]     Reason for visit: Follow up    Questions / Concerns that need to be addressed: wants to know why he has fluid in body. Patient states it is coming through skin.         Omron BPs (complete if screening BP has a systolic  > 129 or diastolic > 79)  BP#1 109/63 p 74         Allergies reviewed: Yes    Medication reviewed: Yes  Pended refills? No        HCDM reviewed and updated in Epic:    We are working to make sure all of our patients??? wishes are updated in Epic and part of that is documenting a Environmental health practitioner for each patient  A Health Care Decision Maker is someone you choose who can make health care decisions for you if you are not able - who would you most want to do this for you????  is already up to date.        BPAs completed:  AUDIT - Alcohol Screen      COVID-19 Vaccine Summary  Which COVID-19 Vaccine was administered  Pfizer  Type:  Dates Given:                   If no: Are you interested in scheduling?     Immunization History   Administered Date(s) Administered    COVID-19 VAC,BIVALENT,MODERNA(BLUE CAP) 04/02/2021    COVID-19 VACC,MRNA,(PFIZER)(PF) 11/20/2020    COVID-19 VACCINE,MRNA(MODERNA)(PF) 07/02/2019, 07/30/2019, 05/18/2020    DTaP / IPV 01/28/2013    INFLUENZA QUAD ADJUVANTED 2YR UP(FLUAD) 02/19/2021, 05/18/2022    Influenza Vaccine Quad(IM)6 MO-Adult(PF) 03/18/2016, 03/22/2017, 03/20/2018, 02/27/2019    Influenza Virus Vaccine, unspecified formulation 05/31/2015, 03/11/2019    PNEUMOCOCCAL POLYSACCHARIDE 23-VALENT 04/13/2016, 04/08/2019    Pneumococcal Conjugate 13-Valent 06/30/2014    TdaP 08/20/2011       __________________________________________________________________________________________    SCREENINGS COMPLETED IN FLOWSHEETS    HARK Screening       AUDIT       PHQ2       PHQ9          Link for Multi-language PHQ/GAD screeners: [email or web address].phqscreeners.com/select-screener    P4 Suicidality Screener                GAD7 COPD Assessment       Falls Risk       .imcres

## 2022-12-02 NOTE — Unmapped (Signed)
Internal Medicine Clinic Visit    Reason for visit: 6 week return    A/P:         1. Stage 3b chronic kidney disease (CMS-HCC)    2. Primary hypertension    3. 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 Abdominal distension    5. Generalized edema        1.Acute on chronic abdominal distension  Most likely secondary to chronic ileus. Diffuse colonic dilatation was described in his most recent abdominal CT in September 2023. Patient is currently taking opioids 2-3x/week for cancer pain, but denies constipation. Last colonoscopy in 2020. There may also be a component of ascites, although there was no ascites on September 2023 imaging. No history of cirrhosis. Mild hypoalbuminemia on 6/10 labs.  Plan:  -Abdominal X-ray today to rule out obstruction  -BNP to evaluate for heart failure  -CMP to check LFTs, creatinine, albumin  -E-consult to GI for assistance with management    2.CKD with proteinuria  Most recent Cr was 2.1 on 6/10. Nephrology e-consult on 05/07 recommended follow up in nephrology clinic. Prior workup included a renal ultrasound negative for hydronephrosis and UPCR elevated at 0.36.   Plan:    - Placed nephrology consult to be seen in clinic, but discussed with the patient and his daughter that it would be acceptable if he preferred not to go see an additional new doctor.   - Recheck creatinine today    3. HTN, well controlled  BP much lower than prior at 109/63 today. Treated only with carvedilol 25 mg BID. Previously on hydrochlorothiazide, but this was discontinued due to recent AKI. 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 Would benefit from RAAS inhibition, but given history of angioedema and tracheal stenosis, the benefits do not outweigh the risks at this time.  Plan:  - Continue carvedilol 25 mg BID    4. Hx of PE on apixaban  On apixaban 5 mg BID. Current creatinine clearance is 40. Dose adjustment is not required.   Plan:   -Continue apixaban 5 mg BID.   -Consider having pt stop following with PA Arby Barrette for anticoag visits as the pt is frustrated that he sees too many medical providers.        Return in about 6 weeks (around 01/13/2023).    Staffed with Dr. Teressa Senter, seen and discussed    __________________________________________________________    HPI:  Adam Hoover is a 77 y/o M with PMHx including metastatic prostate CA, CKD stage 3b, HTN, and PE (on apixaban) who presents for six week follow up with his daughter Adam Hoover. Dr. Mills Koller was his prior PCP and last saw him on 10/14/22.      [name] abdomen is significantly distended today, and his daughter reports that it is the biggest that she has ever seen it. He has a history of alcohol use disorder but no history of cirrhosis. He denies any abdominal pain or discomfort from his distended abdomen, nausea, vomiting, fever, or dyspnea. He states that he is not constipated and has multiple bowel movements per day. He expresses frustration that doctors have not figured out what is causing his abdominal distension and feels that he is seeing too many different doctors.     Otherwise, he is doing well. His cancer pain is well controlled. He only occasionally takes oxycodone, about 2-3 times per week. He reports that he does not take any NSAIDs or any  OTC pain relievers. He has been struggling with lymphedema but is now scheduled next week for a fitting of compression stockings.     __________________________________________________________        Medications:  Reviewed in EPIC  __________________________________________________________    Physical Exam:   Vital Signs:  Vitals:    12/02/22 1036   BP: 109/63   BP Site: L Arm   BP Position: Sitting   BP Cuff Size: Medium   Pulse: 74   Resp: 20   Temp: 36.6 ??C (97.8 ??F)   TempSrc: Temporal   SpO2: 95%   Weight: (!) 119.9 kg (264 lb 6.4 oz)   Height: 179.1 cm (5' 10.51)          Gen: Well appearing, NAD  CV: RRR, no murmurs  Pulm: CTA bilaterally, no crackles or wheezes  Abd: Tense, distended abdomen. Normal bowel sounds. No abdominal tenderness. No discernable fluid wave.  Ext: 3+ bilateral lower extremity edema    Estimated Creatinine Clearance: 44.6 mL/min (A) (based on SCr of 1.9 mg/dL (H)).     Medication adherence and barriers to the treatment plan have been addressed. Opportunities to optimize healthy behaviors have been discussed. Patient and daughter voiced understanding.

## 2022-12-02 NOTE — Unmapped (Addendum)
It was nice to meet you today! I will reach out to you via MyChart with the results and whether I recommend any further workup.     I have placed an e-consult to gastroenterology to get their input on what they recommend for your dilated bowel.     I hope to see you again in 6 weeks.

## 2022-12-05 NOTE — Unmapped (Signed)
I saw and evaluated the patient, participating in the key portions of the service.  I reviewed the resident???s note.  I agree with the resident???s findings and plan. Stanford Scotland, MD.    Patient presents with concern for acute on chronic abdominal distension (per family; patient does not feel that abdomen is more distended). Abdomen soft and NT. Normal BM and passing gas per patient without hematochezia or melena. No fevers. Workup as detailed in resident note. Patient very hesitant to pursue further workup or see sub-specialist. He is agreeable to GI e-consult however.

## 2022-12-08 NOTE — Unmapped (Signed)
Faxed NP Kelly's note from 11/07/22 regarding patient's lymphedema to [employer] at [phone] so that he can get his compression stockings.

## 2022-12-11 DIAGNOSIS — K5981 Chronic pseudo-obstruction of colon: Principal | ICD-10-CM

## 2022-12-12 NOTE — Unmapped (Signed)
[facility] Ambulatory E-Consult Note         Thank you for your Ambulatory E-Consult.     To summarize: Patient is a 77 yo M with  PMH sign't for  metastatic prostate CA, CKD stage 3b, HTN, and PE (on apixaban), chronic intestinal pseudo-obstruction. Colonic dilation has been noted since 2020 although some variation in the overall diameter. Per recent notes patient denies n/v/constipation/abd pain.  Recent K  the 3s. Mg 1.8 6 months ago.Question of management.    My recommendations are as follows: This is a challenging process to manage. He would benefit from a GI clinic appointment to allow for a more comprehensive management plan. In the interim, could optimize electrolytes (K and Mg) and could empirically treat for bacterial overgrowth with xifaxan. If bowels are moving well, relistor may not provide a big benefit especially if if the patient is taking only minimal opiates. We are happy to see the patient in clinic - perhaps even once since I saw he would prefer not to see additional providers.         I spent 11-15 minutes in medical consultative verbal and/or internet discussion and review of medical records, including a written report to the treating provider via electronic health record regarding the condition of this patient.     I spent over >50% of the service in medical consultative verbal or Internet discussion.     This Ambulatory E-Consult did include an answerable clinical question and did recommend a clinic visit.    Liane Comber, MD      This Ambulatory E-Consult is based solely on the clinical information available to me in the patient's medical record and is provided without benefit of a comprehensive evaluation or physical examination of the patient. The information contained in this E-Consult must be interpreted in light of any clinical issues or changes in patient status that were not known to me at the time the E-Consult was completed. You must rely on your own informed clinical judgement for decision making. If necessary, refer the  patient for an in-office consultation. Please contact me if you have further questions.

## 2022-12-16 NOTE — Unmapped (Signed)
[facility] Clinical Assessment & Refill Coordination Note    Adam Hoover, : 12-08-1945  Phone: [phone] (home)     All above HIPAA information was verified with patient's family member, daughter.     Was a Nurse, learning disability used for this call? No    Specialty Medication(s):   Hematology/Oncology: [name]     Current Outpatient Medications   Medication Sig Dispense Refill    apixaban (ELIQUIS) 5 mg Tab Take 1 tablet by mouth two (2) times a day. 60 tablet 11    carvedilol (COREG) 25 MG tablet Take 1 tablet (25 mg total) by mouth two (2) times a day. 180 tablet 3    darolutamide (NUBEQA) 300 mg tablet Take 2 tablets (600 mg total) by mouth in the morning and 2 tablets (600 mg total) in the evening. Take with meals. Take with food. Swallow tablets whole.. 120 tablet 11    magnesium oxide (MAG-OX) 400 mg (241.3 mg elemental magnesium) tablet Take 1 tablet (400 mg total) by mouth nightly. 120 tablet 1    naloxone (NARCAN) 4 mg nasal spray One spray in either nostril once for known/suspected opioid overdose. May repeat every 2-3 minutes in alternating nostril til EMS arrives 2 each 0    oxyCODONE (ROXICODONE) 5 MG immediate release tablet Take 1/2 to 1 tablet (2.5-5 mg total) by mouth every eight (8) hours as needed for pain. 60 tablet 0    potassium chloride 20 MEQ ER tablet Take 1 tablet (20 mEq total) by mouth daily. 90 tablet 3     No current facility-administered medications for this visit.        Changes to medications: Adam Hoover reports no changes at this time.    Allergies   Allergen Reactions    Acetaminophen Hives     Per pt report Only, tolerated well.     Penicillins Hives    Lisinopril      Angioedema        Changes to allergies: No    SPECIALTY MEDICATION ADHERENCE     Nubeqa 300 mg: 7-8 days of medicine on hand     Medication Adherence    Patient reported X missed doses in the last month: 0  Specialty Medication: darolutamide 600 mg twice daily  Patient is on additional specialty medications: No  Informant: child/children  Confirmed plan for next specialty medication refill: delivery by pharmacy  Refills needed for supportive medications: not needed          Specialty medication(s) dose(s) confirmed: Regimen is correct and unchanged.     Are there any concerns with adherence?  No, denies missed doses.  [facility] last dispensed #30 day supply (120 tablets) on 11/02/22     Adherence counseling provided? Not needed    CLINICAL MANAGEMENT AND INTERVENTION      Clinical Benefit Assessment:    Do you feel the medicine is effective or helping your condition? Yes    Clinical Benefit counseling provided? Not needed    Adverse Effects Assessment:    Are you experiencing any side effects? No    Are you experiencing difficulty administering your medicine? No    Quality of Life Assessment:    Quality of Life    Rheumatology  Oncology  Dermatology  Cystic Fibrosis          How many days over the past month did your prostate cancer  keep you from your normal activities? For example, brushing your teeth or  getting up in the morning. 0    Have you discussed this with your provider? Not needed    Acute Infection Status:    Acute infections noted within Epic:  No active infections  Patient reported infection: None    Therapy Appropriateness:    Is therapy appropriate and patient progressing towards therapeutic goals? Yes, therapy is appropriate and should be continued    DISEASE/MEDICATION-SPECIFIC INFORMATION      N/A    Oncology: Is the patient receiving adequate infection prevention treatment? Not applicable  Does the patient have adequate nutritional support? Not applicable    PATIENT SPECIFIC NEEDS     Does the patient have any physical, cognitive, or cultural barriers? No    Is the patient high risk? No    Did the patient require a clinical intervention? No    Does the patient require physician intervention or other additional services (i.e., nutrition, smoking cessation, social work)? No    SOCIAL DETERMINANTS OF HEALTH     At the [facility], we have learned that life circumstances - like trouble affording food, housing, utilities, or transportation can affect the health of many of our patients.   That is why we wanted to ask: are you currently experiencing any life circumstances that are negatively impacting your health and/or quality of life? Patient declined to answer    Social Determinants of Health     Financial Resource Strain: Low Risk  (07/08/2022)    Overall Financial Resource Strain (CARDIA)     Difficulty of Paying Living Expenses: Not hard at all   Internet Connectivity: No Internet connectivity concern identified (07/08/2022)    Internet Connectivity     Do you have access to internet services: Yes     How do you connect to the internet: Personal Device at home     Is your internet connection strong enough for you to watch video on your device without major problems?: Yes     Do you have enough data to get through the month?: Yes     Does at least one of the devices have a camera that you can use for video chat?: Yes   Food Insecurity: No Food Insecurity (07/08/2022)    Hunger Vital Sign     Worried About Running Out of Food in the Last Year: Never true     Ran Out of Food in the Last Year: Never true   Tobacco Use: Medium Risk (12/02/2022)    Patient History     Smoking Tobacco Use: Former     Smokeless Tobacco Use: Never     Passive Exposure: Never   Housing/Utilities: Low Risk  (07/08/2022)    Housing/Utilities     Within the past 12 months, have you ever stayed: outside, in a car, in a tent, in an overnight shelter, or temporarily in someone else's home (i.e. couch-surfing)?: No     Are you worried about losing your housing?: No     Within the past 12 months, have you been unable to get utilities (heat, electricity) when it was really needed?: No   Alcohol Use: Alcohol Misuse (12/02/2022)    Alcohol Use     How often do you have a drink containing alcohol?: 4+ times per week     How many drinks containing alcohol do you have on a typical day when you are drinking?: 3 - 4     How often do you have 5 or more drinks on one occasion?: Daily  or almost daily   Transportation Needs: No Transportation Needs (07/08/2022)    PRAPARE - Transportation     Lack of Transportation (Medical): No     Lack of Transportation (Non-Medical): No   Substance Use: Medium Risk (07/08/2022)    Substance Use     Taken prescription drugs for non-medical reasons: Never     Taken illegal drugs: Once or Twice Yearly     Patient indicated they have taken drugs in the past year for non-medical reasons: Yes, [positive answer(s)]: Yes   Health Literacy: Low Risk  (07/08/2022)    Health Literacy     : Never   Physical Activity: Unknown (07/08/2022)    Exercise Vital Sign     Days of Exercise per Week: 4 days     Minutes of Exercise per Session: Not on file   Interpersonal Safety: Not At Risk (07/08/2022)    Interpersonal Safety     Unsafe Where You Currently Live: No     Physically Hurt by Anyone: No     Abused by Anyone: No   Stress: No Stress Concern Present (07/08/2022)    [employer] of Occupational Health - Occupational Stress Questionnaire     Feeling of Stress : Not at all   Intimate Partner Violence: Not At Risk (07/08/2022)    Humiliation, Afraid, Rape, and Kick questionnaire     Fear of Current or Ex-Partner: No     Emotionally Abused: No     Physically Abused: No     Sexually Abused: No   Depression: Not at risk (07/08/2022)    PHQ-2     PHQ-2 Score: 0   Social Connections: Moderately Isolated (07/08/2022)    Social Connection and Isolation Panel [NHANES]     Frequency of Communication with Friends and Family: More than three times a week     Frequency of Social Gatherings with Friends and Family: Three times a week     Attends Religious Services: 1 to 4 times per year     Active Member of Clubs or Organizations: No     Attends Banker Meetings: Never     Marital Status: Separated       Would you be willing to receive help with any of the needs that you have identified today? Not applicable       SHIPPING     Specialty Medication(s) to be Shipped:   Hematology/Oncology: Nubeqa    Other medication(s) to be shipped: No additional medications requested for fill at this time     Changes to insurance: No    Delivery Scheduled: Yes, Expected medication delivery date: 12/21/22.     Medication will be delivered via Next Day Courier to the confirmed prescription address in [facility].    The patient will receive a drug information handout for each medication shipped and additional FDA Medication Guides as required.  Verified that patient has previously received a Conservation officer, historic buildings and a Surveyor, mining.    The patient or caregiver noted above participated in the development of this care plan and knows that they can request review of or adjustments to the care plan at any time.      All of the patient's questions and concerns have been addressed.    Kermit Balo, [facility]   [facility] Specialty Pharmacist

## 2022-12-20 MED FILL — NUBEQA 300 MG TABLET: ORAL | 30 days supply | Qty: 120 | Fill #5

## 2022-12-21 MED FILL — ELIQUIS 5 MG TABLET: ORAL | 30 days supply | Qty: 60 | Fill #1

## 2023-01-12 DIAGNOSIS — C61 Malignant neoplasm of prostate: Principal | ICD-10-CM

## 2023-01-12 MED ORDER — NUBEQA 300 MG TABLET
ORAL_TABLET | Freq: Two times a day (BID) | ORAL | 11 refills | 30 days
Start: 2023-01-12 — End: ?

## 2023-01-13 MED ORDER — NUBEQA 300 MG TABLET
ORAL_TABLET | Freq: Two times a day (BID) | ORAL | 11 refills | 30 days | Status: CP
Start: 2023-01-13 — End: ?
  Filled 2023-02-09: qty 120, 30d supply, fill #0

## 2023-01-13 MED FILL — CARVEDILOL 25 MG TABLET: ORAL | 90 days supply | Qty: 180 | Fill #1

## 2023-01-25 NOTE — Unmapped (Signed)
The [facility] has made a third and final attempt to reach this patient to refill the following medication:Nubeqa.      We have left voicemails on the following phone numbers: [phone], have been unable to leave messages on the following phone numbers: [phone], have sent a text message to the following phone numbers: [phone], and have sent a Mychart questionnaire..    Dates contacted: 8/15,22,28  Last scheduled delivery: 12/20/22    The patient may be at risk of non-compliance with this medication. The patient should call the [facility] at [phone]  Option 4, then Option 1: Oncology to refill medication.    [name] Hulda Humphrey   [facility] Specialty Technician
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
## 2023-02-03 ENCOUNTER — Ambulatory Visit: Admit: 2023-02-03 | Discharge: 2023-02-04 | Payer: MEDICARE | Attending: Nephrology | Primary: Nephrology

## 2023-02-03 DIAGNOSIS — N1832 Stage 3b chronic kidney disease (CMS-HCC): Principal | ICD-10-CM

## 2023-02-03 DIAGNOSIS — N179 Acute kidney failure, unspecified: Principal | ICD-10-CM

## 2023-02-03 LAB — ALBUMIN / CREATININE URINE RATIO
ALBUMIN QUANT URINE: 3 mg/dL
ALBUMIN/CREATININE RATIO: 46.6 ug/mg — ABNORMAL HIGH (ref 0.0–30.0)
CREATININE, URINE: 64.4 mg/dL

## 2023-02-03 NOTE — Unmapped (Signed)
[facility] Refill Coordination Note    Specialty Medication(s) to be Shipped:   Hematology/Oncology: Nubeqa    Other medication(s) to be shipped: No additional medications requested for fill at this time     Adam Hoover, DOB: 04/27/1946  Phone: [phone] (home)       All above HIPAA information was verified with patient.     Was a Nurse, learning disability used for this call? No    Completed refill call assessment today to schedule patient's medication shipment from the [facility] [phone]).  All relevant notes have been reviewed.     Specialty medication(s) and dose(s) confirmed: Regimen is correct and unchanged.   Changes to medications: [name] reports no changes at this time.  Changes to insurance: No  New side effects reported not previously addressed with a pharmacist or physician: None reported  Questions for the pharmacist: No    Confirmed patient received a Conservation officer, historic buildings and a Surveyor, mining with first shipment. The patient will receive a drug information handout for each medication shipped and additional FDA Medication Guides as required.       DISEASE/MEDICATION-SPECIFIC INFORMATION        N/A    SPECIALTY MEDICATION ADHERENCE     Medication Adherence    Patient reported X missed doses in the last month: 0  Specialty Medication: Nubeqa 300 mg  Patient is on additional specialty medications: No  Informant: patient  Confirmed plan for next specialty medication refill: delivery by pharmacy  Refills needed for supportive medications: not needed          Refill Coordination    Has the Patients' Contact Information Changed: No  Is the Shipping Address Different: No         Were doses missed due to medication being on hold? No    Nubeqa 300 mg: ~10-12 days of medicine on hand     REFERRAL TO PHARMACIST     Referral to the pharmacist: Not needed      [facility]     Shipping address confirmed in Epic.       Delivery Scheduled: Yes, Expected medication delivery date: 02/10/23.     Medication will be delivered via Next Day Courier to the prescription address in Epic WAM.    Kermit Balo, [facility]   [facility] Specialty Pharmacist

## 2023-02-09 ENCOUNTER — Other Ambulatory Visit: Admit: 2023-02-09 | Discharge: 2023-02-09 | Payer: MEDICARE

## 2023-02-09 ENCOUNTER — Ambulatory Visit
Admit: 2023-02-09 | Discharge: 2023-02-09 | Payer: MEDICARE | Attending: Nurse Practitioner | Primary: Nurse Practitioner

## 2023-02-09 ENCOUNTER — Ambulatory Visit
Admit: 2023-02-09 | Discharge: 2023-02-09 | Payer: MEDICARE | Attending: Student in an Organized Health Care Education/Training Program | Primary: Student in an Organized Health Care Education/Training Program

## 2023-02-09 DIAGNOSIS — C7951 Secondary malignant neoplasm of bone: Principal | ICD-10-CM

## 2023-02-09 DIAGNOSIS — C61 Malignant neoplasm of prostate: Principal | ICD-10-CM

## 2023-02-09 DIAGNOSIS — N1832 Stage 3b chronic kidney disease (CMS-HCC): Principal | ICD-10-CM

## 2023-02-09 LAB — COMPREHENSIVE METABOLIC PANEL
ALBUMIN: 3.5 g/dL (ref 3.4–5.0)
ALKALINE PHOSPHATASE: 78 U/L (ref 46–116)
ALT (SGPT): <7 U/L — ABNORMAL LOW (ref 10–49)
ANION GAP: 7 mmol/L (ref 5–14)
AST (SGOT): 12 U/L (ref <=34)
BILIRUBIN TOTAL: 0.4 mg/dL (ref 0.3–1.2)
BLOOD UREA NITROGEN: 38 mg/dL — ABNORMAL HIGH (ref 9–23)
BUN / CREAT RATIO: 19
CALCIUM: 9.7 mg/dL (ref 8.7–10.4)
CHLORIDE: 113 mmol/L — ABNORMAL HIGH (ref 98–107)
CO2: 21 mmol/L (ref 20.0–31.0)
CREATININE: 2.03 mg/dL — ABNORMAL HIGH
EGFR CKD-EPI (2021) MALE: 34 mL/min/{1.73_m2} — ABNORMAL LOW (ref >=60)
GLUCOSE RANDOM: 100 mg/dL (ref 70–179)
POTASSIUM: 3.2 mmol/L — ABNORMAL LOW (ref 3.5–5.1)
PROTEIN TOTAL: 7.5 g/dL (ref 5.7–8.2)
SODIUM: 141 mmol/L (ref 135–145)

## 2023-02-09 LAB — CBC W/ AUTO DIFF
BASOPHILS ABSOLUTE COUNT: 0 10*9/L (ref 0.0–0.1)
BASOPHILS RELATIVE PERCENT: 0.4 %
EOSINOPHILS ABSOLUTE COUNT: 0.1 10*9/L (ref 0.0–0.5)
EOSINOPHILS RELATIVE PERCENT: 2.2 %
HEMATOCRIT: 30.7 % — ABNORMAL LOW (ref 39.0–48.0)
HEMOGLOBIN: 10.1 g/dL — ABNORMAL LOW (ref 12.9–16.5)
LYMPHOCYTES ABSOLUTE COUNT: 0.9 10*9/L — ABNORMAL LOW (ref 1.1–3.6)
LYMPHOCYTES RELATIVE PERCENT: 24.3 %
MEAN CORPUSCULAR HEMOGLOBIN CONC: 32.9 g/dL (ref 32.0–36.0)
MEAN CORPUSCULAR HEMOGLOBIN: 30.9 pg (ref 25.9–32.4)
MEAN CORPUSCULAR VOLUME: 93.8 fL (ref 77.6–95.7)
MEAN PLATELET VOLUME: 8.6 fL (ref 6.8–10.7)
MONOCYTES ABSOLUTE COUNT: 0.5 10*9/L (ref 0.3–0.8)
MONOCYTES RELATIVE PERCENT: 12.2 %
NEUTROPHILS ABSOLUTE COUNT: 2.3 10*9/L (ref 1.8–7.8)
NEUTROPHILS RELATIVE PERCENT: 60.9 %
PLATELET COUNT: 226 10*9/L (ref 150–450)
RED BLOOD CELL COUNT: 3.28 10*12/L — ABNORMAL LOW (ref 4.26–5.60)
RED CELL DISTRIBUTION WIDTH: 15.6 % — ABNORMAL HIGH (ref 12.2–15.2)
WBC ADJUSTED: 3.8 10*9/L (ref 3.6–11.2)

## 2023-02-09 LAB — PSA: PROSTATE SPECIFIC ANTIGEN: 6.78 ng/mL — ABNORMAL HIGH (ref 0.00–4.00)

## 2023-02-09 NOTE — Unmapped (Signed)
OUTPATIENT ONCOLOGY PALLIATIVE CARE    Principal Diagnosis: Mr. Ketch is a 77 y.o. 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 Complicated by co-morbid acute and chronic conditions including low back pain, acute on chronic kidney disease, PE, tracheal stenosis, alcohol use disorder, and chronic dilated transverse colon.     Assessment/Plan:     # Pain: Bilateral knee and leg pain due to lymphedema. Hx of back pain.  Rare use-last prescription for oxycodone 60 tablets was written in June 2024  - Continue oxycodone 2.5-5 mg every 8 hours for pain or shortness of breath or pain.  No prescription sent today.  - No PO NSAIDs given CKD and cardiac hx-not using  - No APAP given allergy    # Leg lymphedema-still present-remains 3+ edema extending up to thighs. In the process of trying to get compression hose.  Lymphedema affects both his functional status and pain.  -Order placed to physical therapy lymphedema clinic for compression Velcro support stockings  -Plugged in with nephrology, also has follow-up visit with gastro enterology to see if this is contributing to worsening edema     # ACP:   -Previously confirmed DNR/DNI. They think the yellow form is in the home. If not, I can send a form to them via the mail.    -At prior visits: Completed MOST and DNR/DNI form on 05/09/2022  -Limited medical interventions, antibiotics if needed, IV fluids if indicated and no feeding tube.  -Wants to have peace in his life and his illness not to be a hardship for his daughter.    Primary value is independence. Does have the prepare for your care document.    Confirmed Health Care Decision Maker as of 02/09/2023    HCDM (patient stated preference): [place], Grizzel - Daughter - [phone]         #Controlled substances risk management:  Patient has a signed pain medication agreement with Outpatient Palliative Care, completed on 11/02/20, as per standard of care.  NCCSRS database was reviewed today and it was appropriate.  Urine drug screen was not performed at this visit. Findings: not applicable.  Patient has received information about safe storage and administration of medications.  Patient has received a prescription for narcan; is not applicable.     F/u: 3 months-clinic    ----------------------------------------  Referring Provider: Dr. 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     Current cancer-directed therapy:  Nubeqa    Interval events: Still feels like pain is pretty well controlled. Had fu with Nephrology, no concerns, now established. Has fu visit with GI on 9/20 to assess for possible GI causes contributing to LE edema. Has PCP that he is seeing regularly.     No other significant concerns.  Energy generally stable, remains independent with his mobility, mood has been good.  No nausea.  Having regular bowel movements.     Symptom Review: Initial visit  General: Overall slowly feeling better since being discharged from the hospital  Pain: Pain generally well controlled right now. Tightness in legs is his biggest concern. Taking MS Contin 30mg  every 12 hours. Taking Oxycodone 5mg  as needed. Over the last 2 days he has only taken one oxycodone. Has 10 tabs left.   Fatigue: Some days he feels slugish but other days feels ok. Feels like he has to take his time with getting things done. But is still  able to do some work in the yard and do some walking.    Mobility: Independent  Sleep: Good  Appetite: Stable, has not lost significant weight.  Thinks he has gained weight since discharge.   Nausea: None   Bowel function: Having BMs every day. Has been using Miralax every other day. Not taking senna currenlty.   Dyspnea: Mild with exertion, none at rest.    Secretions: None  Mood: Ok overall.     Interval hx 11/07/2022 CK, Mr. Mulloy and daughter [name]      Symptom Review:  General: ok-leg edema is about the same.  Pain: Primary pain continues to be in bilateral knees and legs.  Rare use of oxycodone 5 mg taking on average 1 to 2 tablets/week.  Not using any NSAIDs or Tylenol.  Fatigue: Sometimes has fatigue-energy overall okay.  Still able to live independently  Mobility: Uses a wheelchair when comes into [facility]  Sleep: Okay  Appetite: Okay  Nausea:   Bowel function: No issues with constipation or diarrhea.  Dyspnea: Stable-cough is about the same  Mood: Okay.  Last month was somewhat rough due to reflecting on his mother's birthday.      Palliative Performance Scale: 70% - Ambulation: Reduced / unable to do normal work, some evidence of disease / Self-Care: Full / Intake: Normal or reduced / Level of Conscious: Full    Coping/Support Issues: Patient is close to his daughter [name], and likes to think of the positive side of things, accept responsibility for his role in his illness, and take things day by day.     Goals of Care: Cancer-directed therapy: durolutamide    Social History: Lives alone (daughter lives 15 minutes away); Worked as a Nutritional therapist.  Christian affiliation.  Reads the Bible  Name of primary support: Daughter [name]  Hobbies: Cooking, yardwork  Current residence / distance from [place]: [place]      Objective     Allergies:   Allergies   Allergen Reactions    Acetaminophen Hives     Per pt report Only, tolerated well.     Penicillins Hives    Lisinopril      Angioedema        Family History:  family history includes Cancer in his father; Heart disease in his brother; Kidney disease in his brother; No Known Problems in his brother and daughter.  He indicated that his mother is deceased. He indicated that his father is deceased. He indicated that all of his three brothers are alive. He indicated that his daughter is alive. He indicated that the status of his neg hx is unknown.      REVIEW OF SYSTEMS:  A comprehensive review of 14 systems was negative except for pertinent positives noted in HPI.    Vital signs for this encounter: VS reviewed in EPIC.  GEN: Awake and alert & in no acute distress  PSYCH: Alert and oriented to person, place and time. Euthymic.  HEENT: Pupils equally round without scleral icterus. No facial asymmetry.  CV: Regular rhythm with normal rate, no murmurs.  LUNGS: clear  ABD: Distended  EXT: 3+ BLE edema noted of the lower extremities  NEURO: Nonfocal        Lab Results   Component Value Date    CREATININE 2.03 (H) 02/09/2023     Lab Results   Component Value Date    ALKPHOS 78 02/09/2023    BILITOT 0.4 02/09/2023    BILIDIR 0.20  08/11/2021    PROT 7.5 02/09/2023    ALBUMIN 3.5 02/09/2023    ALT <7 (L) 02/09/2023    AST 12 02/09/2023       Pam Drown, FNP-BC, [facility]  Outpatient Oncology Palliative Care Service  [facility]  [address] [place], [place] [address]  [phone]     I personally spent 35 minutes face-to-face and non-face-to-face in the care of this patient, which includes all pre, intra, and post visit time on the date of service.  All documented time was specific to the E/M visit and does not include any procedures that may have been performed.
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
## 2023-02-10 ENCOUNTER — Ambulatory Visit: Admit: 2023-02-10 | Payer: MEDICARE

## 2023-02-10 LAB — SERUM FREE LIGHT CHAINS
K/L FLC RATIO: 1.74 — ABNORMAL HIGH (ref 0.26–1.65)
KAPPA FREE,SERUM: 11.18 mg/dL — ABNORMAL HIGH (ref 0.33–1.94)
LAMBDA FREE, SER: 6.43 mg/dL — ABNORMAL HIGH (ref 0.57–2.63)

## 2023-02-10 LAB — MONOCLONAL GAMMOPATHY CHEMISTRIES
GAMMAGLOBULIN; IGA: 395.1 mg/dL (ref 70.0–400.0)
GAMMAGLOBULIN; IGG: 2048 mg/dL — ABNORMAL HIGH (ref 650–1600)
GAMMAGLOBULIN; IGM: 46 mg/dL (ref 40–230)
PROTEIN TOTAL (SPECIAL CHEM): 7.5 g/dL (ref 5.7–8.2)

## 2023-02-10 LAB — MONOCLONAL GAMMOPATHY WORKUP, SERUM
ALBUMIN (SPE): 3.5 g/dL (ref 3.5–5.0)
ALPHA-1 GLOBULIN: 0.3 g/dL (ref 0.2–0.5)
ALPHA-2 GLOBULIN: 0.8 g/dL (ref 0.5–1.1)
BETA-1 GLOBULIN: 0.4 g/dL (ref 0.3–0.6)
BETA-2 GLOBULIN: 0.5 g/dL (ref 0.2–0.6)
GAMMAGLOBULIN: 1.9 g/dL — ABNORMAL HIGH (ref 0.5–1.5)
PROTEIN TOTAL (SPECIAL CHEM): 7.5 g/dL
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
## 2023-02-13 NOTE — Unmapped (Signed)
Internal Medicine Clinic Visit    Reason for visit: 6 week follow up    A/P:         1. Small intestinal bacterial overgrowth (SIBO)    2. Ileus (CMS-HCC)    3. Abnormal finding of blood chemistry, unspecified        Chronic intestinal pseudo-obstruction  Patient is not taking any anticholinergic medications and uses opioids sparingly. GI e-consult recommended in-person evaluation and empiric treatment for bacterial overgrowth. Rifaximin would be the preferred treatment, as it has been the best studied for the treatment of bacterial overgrowth and has fewer systemic effects. If this medication is prohibitively expensive despite insurance, will consider use of metronidazole as an alternative therapy. Patient is now willing to see GI in person.  - Prescribed rifaximin 550 mg TID x 14 days  - Referral to GI for in-person evaluation    2. History of hypokalemia  K of 3.2 on 9/12, despite taking KCl 20 mEq daily. Patient has occasionally had diarrhea, but it is not clear why he has a persistent need for K supplementation despite no longer taking HCTZ.   - Continue KCl 20 mEq daily  - BMP today to ensure adequate repletion    3. History of hypomagnesemia  Mag 1.8 when last checked 06/03/22. Has been on magnesium supplementation since late 2019. He does have a history of alcohol use disorder, but no longer drinks alcohol daily and denies any alcohol in the past week.   - Continue magnesium oxide 400 mg nightly  - Checking Mag today to ensure adequate repletion    4. HTN  BP 110/69 today On carvedilol 25 mg BID monotherapy. Previously on hydrochlorothiazide, but this was discontinued earlier in 2024 due to AKI. He has prior history of severe angioedema to ACEi resulting in hospitalization in 2020   - Continue carvedilol 25 mg BID    5. CKD Stage3bA2  Most recent creatinine 2.03 on 9/12. Established care with Nephrology on 9/6, who expressed concern that his colonic dilation could be contributing to CKD by increasing intra-abdominal pressure to the point that venous return is compromised. Nephrology also noted an elevated gamma gap (4.2 on 7/5, now 4.0 on 9/12), and initiated a myeloma workup.    6. Hx of unprovoked PE in 2017, on apixaban indefinitely  On apixaban 5 mg BID. Current creatinine clearance is 40.  -Continue apixaban 5 mg BID.   -Will have pt stop following with PA Arby Barrette for anticoag visits as he is frustrated that he sees too many medical providers.     7. Health maintenance  -COVID-19 vaccine today  -Influenza vaccine today    Return in about 3 months (around 05/19/2023).    Staffed with Dr. Brooke Dare, seen and discussed    __________________________________________________________    HPI:  Adam Hoover is a 77 y/o M with PMHx including metastatic prostate CA, CKD stage 3b, HTN, PE (on lifelong apixaban), paroxysmal A Fib, and lymphedema who presents for follow up with his daughter Adam Hoover. He was last seen on 7/5.     At that time, their major concern was acute on chronic abdominal distension of unknown etiology. GI E-consult was ordered. GI was concerned for bacterial overgrowth and recommended empiric treatment with rifaximin, as well as electrolyte optimization. The patient also established care with nephrology on 9/6 and last saw oncology and palliative care on 9/12.     The patient and his daughter explain that the abdominal distension is persistent, although somewhat less  than prior. It is not painful, but he feels bloated and uncomfortable. He continues to have regular bowel movements, about 3 per day, which are typically solid but sometimes more liquid, depending on what he has been eating. He denies any nausea or vomiting. He only takes opioids sparingly, and has not taken any in the past week. He has a history of low magnesium and potassium levels, and takes supplements for both daily.     The patient notes that he has finished seeing physical therapy for his lymphedema. He and Adam Hoover now feel that they are able to see more healthcare providers including seeing GI in person, but they are in agreement with the plan to treat empirically for bacterial overgrowth while waiting to see GI in person.   __________________________________________________________    Medications:  Reviewed in EPIC  __________________________________________________________    Physical Exam:   Vital Signs:  Vitals:    02/17/23 1051   BP: 119/69   Pulse: 64   Temp: 36.6 ??C (97.8 ??F)   TempSrc: Temporal   SpO2: 98%   Weight: (!) 113.4 kg (250 lb)   Height: 180.3 cm (5' 11)          Gen: Alert, chronically ill appearing, no acute distress  CV: RRR, no murmurs  Pulm: Normal work of breathing on room air. CTA bilaterally, no crackles or wheezes  Abd: Significantly distended abdomen, nontender to palpation. Decreased bowel sounds.   Ext: Lower extremity edema noted, not able to determine severity as he is wearing lymphedema leg wraps.     Medication adherence and barriers to the treatment plan have been addressed. Opportunities to optimize healthy behaviors have been discussed. Patient and caregiver voiced understanding.

## 2023-02-17 ENCOUNTER — Ambulatory Visit: Admit: 2023-02-17 | Discharge: 2023-02-18 | Payer: MEDICARE

## 2023-02-17 DIAGNOSIS — K567 Ileus, unspecified: Principal | ICD-10-CM

## 2023-02-17 DIAGNOSIS — R799 Abnormal finding of blood chemistry, unspecified: Principal | ICD-10-CM

## 2023-02-17 DIAGNOSIS — K638219 Small intestinal bacterial overgrowth (SIBO): Principal | ICD-10-CM

## 2023-02-17 LAB — MAGNESIUM: MAGNESIUM: 1.9 mg/dL (ref 1.6–2.6)

## 2023-02-17 LAB — BASIC METABOLIC PANEL
ANION GAP: 8 mmol/L (ref 5–14)
BLOOD UREA NITROGEN: 35 mg/dL — ABNORMAL HIGH (ref 9–23)
BUN / CREAT RATIO: 18
CALCIUM: 10.2 mg/dL (ref 8.7–10.4)
CHLORIDE: 109 mmol/L — ABNORMAL HIGH (ref 98–107)
CO2: 21.3 mmol/L (ref 20.0–31.0)
CREATININE: 1.92 mg/dL — ABNORMAL HIGH
EGFR CKD-EPI (2021) MALE: 36 mL/min/{1.73_m2} — ABNORMAL LOW (ref >=60)
GLUCOSE RANDOM: 97 mg/dL (ref 70–179)
POTASSIUM: 3.6 mmol/L (ref 3.4–4.8)
SODIUM: 138 mmol/L (ref 135–145)

## 2023-02-17 MED ORDER — RIFAXIMIN 550 MG TABLET
ORAL_TABLET | Freq: Three times a day (TID) | ORAL | 0 refills | 5 days | Status: CP
Start: 2023-02-17 — End: ?
  Filled 2023-02-24: qty 14, 5d supply, fill #0

## 2023-02-17 NOTE — Unmapped (Addendum)
It was nice to see you today! I have ordered the prescription for the rifaximin to the [facility] pharmacy. It will have to go through a prior authorization process. If it ends up being very expensive, reach out to me and we will switch to a different antibiotic.    We are checking potassium and magnesium levels today. I will let you know if you need to increase the dose of either the potassium or the magnesium.

## 2023-02-17 NOTE — Unmapped (Signed)
[facility] at [facility]     Reason for visit: Follow up 6 wks    Questions / Concerns that need to be addressed:     Screening BP- 119/69  64      PTHomeBP         HCDM reviewed and updated in Epic:    We are working to make sure all of our patients??? wishes are updated in Epic and part of that is documenting a Environmental health practitioner for each patient  A Health Care Decision Maker is someone you choose who can make health care decisions for you if you are not able - who would you most want to do this for you????  is already up to date.    HCDM (patient stated preference): Krystian, Poliseno - Daughter - [phone]    BPAs completed:  Influenza vaccine and covid vaccine    Annual Screenings:     __________________________________________________________________________________________    SCREENINGS COMPLETED IN FLOWSHEETS      AUDIT       PHQ2       PHQ9          GAD7       COPD Assessment       Falls Risk

## 2023-02-17 NOTE — Unmapped (Signed)
Potassium and magnesium now within normal limits, so will not change the dosage of potassium or magnesium supplements. Communicated to patient via MyChart. Updated that the prior authorization for rifaximin has been initiated. If the medication is too expensive despite insurance coverage, asked Adam Hoover to reach out and will prescribe an alternative agent.

## 2023-02-20 NOTE — Unmapped (Signed)
A prior authorization has been submitted for   Xifaxan 550MG  tablets.    (Key: [id number])    Oley Balm

## 2023-02-20 NOTE — Unmapped (Signed)
I saw and evaluated the patient, participating in the key portions of the service.?? I reviewed the resident???s note.?? I agree with the resident???s findings and plan. Jacquiline Doe, MD

## 2023-03-13 NOTE — Unmapped (Signed)
[facility] Refill Coordination Note    Specialty Medication(s) to be Shipped:   Hematology/Oncology: Nubeqa    Other medication(s) to be shipped: No additional medications requested for fill at this time     Adam Hoover, DOB: [birth year]  Phone: [phone] (home)       All above HIPAA information was verified with patient's family member, daughter.     Was a Nurse, learning disability used for this call? No    Completed refill call assessment today to schedule patient's medication shipment from the [facility] [phone]).  All relevant notes have been reviewed.     Specialty medication(s) and dose(s) confirmed: Regimen is correct and unchanged.   Changes to medications: [name] reports no changes at this time.  Changes to insurance: No  New side effects reported not previously addressed with a pharmacist or physician: None reported  Questions for the pharmacist: No    Confirmed patient received a Conservation officer, historic buildings and a Surveyor, mining with first shipment. The patient will receive a drug information handout for each medication shipped and additional FDA Medication Guides as required.       DISEASE/MEDICATION-SPECIFIC INFORMATION        N/A    SPECIALTY MEDICATION ADHERENCE     Medication Adherence    Patient reported X missed doses in the last month: 0  Specialty Medication: Nubeqa 300 mg  Patient is on additional specialty medications: No  Informant: child/children     Were doses missed due to medication being on hold? No    Nubeqa 300 mg: 6 days of medicine on hand     REFERRAL TO PHARMACIST     Referral to the pharmacist: Not needed      [facility]     Shipping address confirmed in Epic.       Delivery Scheduled: Yes, Expected medication delivery date: 03/16/23.     Medication will be delivered via Next Day Courier to the prescription address in Epic [place].    [name]   [facility] Specialty Pharmacist

## 2023-03-15 MED FILL — NUBEQA 300 MG TABLET: ORAL | 30 days supply | Qty: 120 | Fill #1

## 2023-04-07 MED FILL — CARVEDILOL 25 MG TABLET: ORAL | 90 days supply | Qty: 180 | Fill #2

## 2023-04-07 MED FILL — ELIQUIS 5 MG TABLET: ORAL | 30 days supply | Qty: 60 | Fill #2

## 2023-04-26 DIAGNOSIS — C61 Malignant neoplasm of prostate: Principal | ICD-10-CM

## 2023-04-26 NOTE — Unmapped (Signed)
The [facility] has made a third and final attempt to reach this patient to refill the following medication:Nubeqa.      We have left voicemails on the following phone numbers: [phone], have been unable to leave messages on the following phone numbers: [phone], have sent a text message to the following phone numbers: [phone], and have sent a Mychart questionnaire..    Dates contacted: 11/12,21,27  Last scheduled delivery: 03/15/23    The patient may be at risk of non-compliance with this medication. The patient should call the [facility] at [phone]  Option 4, then Option 1: Oncology to refill medication.    [facility] Specialty Technician

## 2023-05-02 MED FILL — NUBEQA 300 MG TABLET: ORAL | 30 days supply | Qty: 120 | Fill #2

## 2023-05-02 NOTE — Unmapped (Signed)
[facility] Refill Coordination Note    Specialty Medication(s) to be Shipped:   Hematology/Oncology: Nubeqa    Other medication(s) to be shipped: No additional medications requested for fill at this time     Adam Hoover, DOB: May 13, 1946  Phone: [phone] (home)       All above HIPAA information was verified with patient's family member, daughter.     Was a Nurse, learning disability used for this call? No    Completed refill call assessment today to schedule patient's medication shipment from the [facility] [phone]).  All relevant notes have been reviewed.     Specialty medication(s) and dose(s) confirmed: Regimen is correct and unchanged.   Changes to medications: [name] reports no changes at this time.  Changes to insurance: No  New side effects reported not previously addressed with a pharmacist or physician: None reported  Questions for the pharmacist: No    Confirmed patient received a Conservation officer, historic buildings and a Surveyor, mining with first shipment. The patient will receive a drug information handout for each medication shipped and additional FDA Medication Guides as required.       DISEASE/MEDICATION-SPECIFIC INFORMATION        N/A    SPECIALTY MEDICATION ADHERENCE     Medication Adherence    Patient reported X missed doses in the last month: 4  Specialty Medication: Nubeqa 300 mg  Patient is on additional specialty medications: No  Informant: child/children     Were doses missed due to medication being on hold? No    Nubeqa 300 mg: 0 days of medicine on hand     REFERRAL TO PHARMACIST     Referral to the pharmacist: Not needed      [facility]     Shipping address confirmed in Epic.       Delivery Scheduled: Yes, Expected medication delivery date: 05/03/23.     Medication will be delivered via Next Day Courier to the prescription address in Epic [place].    Adam Hoover   [facility] Specialty Pharmacist

## 2023-05-03 NOTE — Unmapped (Signed)
Adam Hoover missed 4 doses of medication due to running out of Nubeqa.  He is scheduled to receive medication today (12/04/).  His oncology team has been notified of the missed doses.    [name], PharmD  [facility]

## 2023-05-04 DIAGNOSIS — C61 Malignant neoplasm of prostate: Principal | ICD-10-CM

## 2023-05-11 ENCOUNTER — Ambulatory Visit
Admit: 2023-05-11 | Discharge: 2023-05-11 | Payer: MEDICARE | Attending: Student in an Organized Health Care Education/Training Program | Primary: Student in an Organized Health Care Education/Training Program

## 2023-05-11 ENCOUNTER — Institutional Professional Consult (permissible substitution): Admit: 2023-05-11 | Discharge: 2023-05-11 | Payer: MEDICARE

## 2023-05-11 ENCOUNTER — Other Ambulatory Visit: Admit: 2023-05-11 | Discharge: 2023-05-11 | Payer: MEDICARE

## 2023-05-11 ENCOUNTER — Ambulatory Visit
Admit: 2023-05-11 | Discharge: 2023-05-11 | Payer: MEDICARE | Attending: Nurse Practitioner | Primary: Nurse Practitioner

## 2023-05-11 DIAGNOSIS — C61 Malignant neoplasm of prostate: Principal | ICD-10-CM

## 2023-05-11 DIAGNOSIS — G893 Neoplasm related pain (acute) (chronic): Principal | ICD-10-CM

## 2023-05-11 DIAGNOSIS — C7951 Secondary malignant neoplasm of bone: Principal | ICD-10-CM

## 2023-05-11 DIAGNOSIS — Z515 Encounter for palliative care: Principal | ICD-10-CM

## 2023-05-11 LAB — CBC W/ AUTO DIFF
BASOPHILS ABSOLUTE COUNT: 0 10*9/L (ref 0.0–0.1)
BASOPHILS RELATIVE PERCENT: 0.4 %
EOSINOPHILS ABSOLUTE COUNT: 0.1 10*9/L (ref 0.0–0.5)
EOSINOPHILS RELATIVE PERCENT: 1.9 %
HEMATOCRIT: 31.2 % — ABNORMAL LOW (ref 39.0–48.0)
HEMOGLOBIN: 10 g/dL — ABNORMAL LOW (ref 12.9–16.5)
LYMPHOCYTES ABSOLUTE COUNT: 0.8 10*9/L — ABNORMAL LOW (ref 1.1–3.6)
LYMPHOCYTES RELATIVE PERCENT: 17.4 %
MEAN CORPUSCULAR HEMOGLOBIN CONC: 32 g/dL (ref 32.0–36.0)
MEAN CORPUSCULAR HEMOGLOBIN: 30.2 pg (ref 25.9–32.4)
MEAN CORPUSCULAR VOLUME: 94.5 fL (ref 77.6–95.7)
MEAN PLATELET VOLUME: 8.4 fL (ref 6.8–10.7)
MONOCYTES ABSOLUTE COUNT: 0.5 10*9/L (ref 0.3–0.8)
MONOCYTES RELATIVE PERCENT: 12 %
NEUTROPHILS ABSOLUTE COUNT: 3.1 10*9/L (ref 1.8–7.8)
NEUTROPHILS RELATIVE PERCENT: 68.3 %
PLATELET COUNT: 206 10*9/L (ref 150–450)
RED BLOOD CELL COUNT: 3.31 10*12/L — ABNORMAL LOW (ref 4.26–5.60)
RED CELL DISTRIBUTION WIDTH: 16.4 % — ABNORMAL HIGH (ref 12.2–15.2)
WBC ADJUSTED: 4.6 10*9/L (ref 3.6–11.2)

## 2023-05-11 LAB — COMPREHENSIVE METABOLIC PANEL
ALBUMIN: 3.5 g/dL (ref 3.4–5.0)
ALKALINE PHOSPHATASE: 83 U/L (ref 46–116)
ALT (SGPT): <7 U/L — ABNORMAL LOW (ref 10–49)
ANION GAP: 11 mmol/L (ref 5–14)
AST (SGOT): 13 U/L (ref <=34)
BILIRUBIN TOTAL: 0.4 mg/dL (ref 0.3–1.2)
BLOOD UREA NITROGEN: 23 mg/dL (ref 9–23)
BUN / CREAT RATIO: 14
CALCIUM: 10 mg/dL (ref 8.7–10.4)
CHLORIDE: 111 mmol/L — ABNORMAL HIGH (ref 98–107)
CO2: 22 mmol/L (ref 20.0–31.0)
CREATININE: 1.62 mg/dL — ABNORMAL HIGH (ref 0.73–1.18)
EGFR CKD-EPI (2021) MALE: 44 mL/min/{1.73_m2} — ABNORMAL LOW (ref >=60)
GLUCOSE RANDOM: 92 mg/dL (ref 70–179)
POTASSIUM: 4.2 mmol/L (ref 3.5–5.1)
PROTEIN TOTAL: 7.6 g/dL (ref 5.7–8.2)
SODIUM: 144 mmol/L (ref 135–145)

## 2023-05-11 LAB — PSA: PROSTATE SPECIFIC ANTIGEN: 5.98 ng/mL — ABNORMAL HIGH (ref 0.00–4.00)

## 2023-05-11 MED ORDER — OXYCODONE 5 MG TABLET
ORAL_TABLET | Freq: Three times a day (TID) | ORAL | 0 refills | 20.00 days | Status: CP | PRN
Start: 2023-05-11 — End: ?
  Filled 2023-05-11: qty 60, 20d supply, fill #0

## 2023-05-11 MED ADMIN — leuprolide acetate (6 month) (ELIGARD) injection 45 mg: 45 mg | SUBCUTANEOUS | @ 17:00:00 | Stop: 2023-05-11

## 2023-05-11 NOTE — Unmapped (Signed)
Pt tolerated Eligard 45 mg injection administered in RLA without difficulty. Band aid and gauze applied. Pt left [facility] via wheelchair, NAD, no questions, complaints, nor concerns voiced at d/c.
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
## 2023-05-11 NOTE — Unmapped (Signed)
Shot again in 6 months  Continue cancer medication for now, itis working well  Follow up with your primary care provider and other doctors  Drink more water and double void (pee then go back in 15 minutes and pee again)  calcium and vitamin D for bone health    Lab Results   Component Value Date    PSA 5.98 (H) 05/11/2023    PSA 6.78 (H) 02/09/2023    PSA 6.42 (H) 11/07/2022    PSA 6.54 (H) 09/08/2022    PSA 8.61 (H) 05/09/2022    PSA 7.45 (H) 02/07/2022         I have recommended calcium and vitamin D supplementation for bone health. Calcium and Vitamin D supplementation comes in different forms. Unlike calcium carbonate, calcium citrate does not require acid for absorption and can be taken with and without food. It is preferred for patients taking a proton pump inhibitor (PPI) such as omeprazole or an H2-receptor antagonist such as ranitidine. Calcium carbonate should be taken with food to enhance absorption. Calcium supplements are generally well tolerated. Constipation, bloating and gas can occur as well as a slightly higher risk of kidney stones.    Examples of calcium and vitamin D supplements include (I encourage you to discuss with your pharmacist since generic is not a problem):    Calcium Carbonate  Caltrate 600-D (includes 600 mg elemental calcium and 800 IU vitamin D3 per tablet) - take 2 tablets daily      [name], NP-C, OCN  Adult Nurse Practitioner  Urology and Medical Oncology    Notice: Many test results will be automatically released into MyChart. This may happen before your provider has a chance to review them. Your results will either be reviewed with you at your scheduled appointment or your provider or someone from their office will reach out to you to discuss the results. Thank you for your understanding    If you have been prescribed a medication today, always read the package insert that comes with the medication.    In the event of an emergency, always call 911    Urology:  Main clinic & [place]: [phone]  Fax: [phone]    Cancer Hospital:  Phone: [phone]  Fax: [phone]    After hours/nights/weekends:  Hospital Operator: [phone]    [email or web address]  [email or web address]

## 2023-05-11 NOTE — Unmapped (Signed)
OUTPATIENT ONCOLOGY PALLIATIVE CARE    Principal Diagnosis: Adam Hoover is a 77 y.o. 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 Complicated by co-morbid acute and chronic conditions including low back pain, acute on chronic kidney disease, PE, tracheal stenosis, alcohol use disorder, and chronic dilated transverse colon.     Assessment/Plan:     # Pain: Bilateral knee and leg pain due to lymphedema. Hx of back pain: Pain is well controlled on 2.5-5 mg of Oxycodone . Mr. Care endorses that with his pain controlled he is as functionally independent as he can be and able to ambulate to the bathroom and kitchen which are his goals.     - Continue oxycodone 2.5-5 mg every 8 hours for pain or shortness of breath or pain.  ( 3 month prescription refilled today)   - No PO NSAIDs given CKD and cardiac hx-not using  - No APAP given allergy    # Leg lymphedema-still present-remains 3+ edema extending up to thighs. Compression stockings have helped with his lymphedema and pain.     - He continues to follow with PT for stocking adjustments as needed   - He is s/p nephrology assessment with inconclusive etiology of stage III CKD and contribution to     lymphedema   - We discussed elevation of lower extremities for intermittent relief lymphedema     # ACP:   -Previously confirmed DNR/DNI. They think the yellow form is in the home. If not, I can send a form to them via the mail.    -At prior visits: Completed MOST and DNR/DNI form on 05/09/2022  -Limited medical interventions, antibiotics if needed, IV fluids if indicated and no feeding tube.  -Wants to have peace in his life and his illness not to be a hardship for his daughter.    Primary value is independence. Does have the prepare for your care document.    Confirmed Health Care Decision Maker as of 05/11/2023    HCDM (patient stated preference): Adam Hoover - Daughter - [phone]      #Controlled substances risk management:  Patient has a signed pain medication agreement with Outpatient Palliative Care, completed on 11/02/20, as per standard of care.  [place]CCSRS database was reviewed today and it was appropriate.  Urine drug screen was not performed at this visit. Findings: not applicable.  Patient has received information about safe storage and administration of medications.  Patient has received a prescription for narcan; is not applicable.     F/u: 3 months-clinic    ----------------------------------------  Referring Provider: Dr. 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     Current cancer-directed therapy:  Nubeqa    Interval events:   12/12 [name] that he is doing well, satisfied with his level of pain control, remains as functional as possible with the help of his daughter and notes improvement of his lower extremity lymphedema with consistent use of stockings. He will continue with androgen deprivation therapy for treatment of his prostate cancer and close follow up with medical oncology.     9/12: Still feels like pain is pretty well controlled. Had fu with Nephrology, no concerns, now established. Has fu visit with GI on 9/20 to assess for possible GI causes contributing to LE edema. Has PCP that he is seeing regularly.     No other significant concerns.  Energy generally stable, remains  independent with his mobility, mood has been good.  No nausea.  Having regular bowel movements.     Symptom Review: Initial visit  General: Overall doing well and feels at functional baseline   Pain: Pain generally well controlled right now. Tightness in legs is his biggest concern. Taking Oxycodone 2.5-5mg  as needed. He can at times go up to 2 weeks without using a tablet and appreciates being able to dose based on his need q 8 hours.    Fatigue: Some days he feels slugish but other days feels ok. Feels like he has to take his time with getting things done. But is still able to do some work in the yard and do some walking around the house.   Mobility: Independent  Sleep: Good  Appetite: Stable, has not lost significant weight.    Nausea: None   Bowel function: Having BMs every day. Has been using Miralax every other day. Not taking senna currenlty.   Dyspnea: Mild with exertion, none at rest.    Secretions: None  Mood: Ok overall.       Symptom Review:  General: ok-leg edema with interval improvement   Pain: Primary pain continues to be in bilateral knees and legs.  Rare use of oxycodone 5 mg taking on average 1 to 2 tablets/week.  Not using any NSAIDs or Tylenol.  Fatigue: Sometimes has fatigue-energy overall okay.  Still able to live independently, daughter Adam Hoover helps a lot    Mobility: Uses a wheelchair when comes into [facility]  Sleep: Denies issues with falling asleep and staying asleep   Appetite: Okay  Nausea: denies   Bowel function: No issues with constipation or diarrhea.  Dyspnea: Stable-cough is about the same  Mood: Okay      Palliative Performance Scale: 70% - Ambulation: Reduced / unable to do normal work, some evidence of disease / Self-Care: Full / Intake: Normal or reduced / Level of Conscious: Full    Coping/Support Issues: Patient is close to his daughter Adam Hoover, and likes to think of the positive side of things, accept responsibility for his role in his illness, and take things day by day.     Goals of Care: Cancer-directed therapy: durolutamide    Social History: Lives alone (daughter lives 15 minutes away); Worked as a Nutritional therapist.  Christian affiliation.  Reads the Bible  Name of primary support: Daughter Adam Hoover  Hobbies: Cooking, yardwork  Current residence / distance from [place]: [place]      Objective     Allergies:   Allergies   Allergen Reactions    Acetaminophen Hives     Per pt report Only, tolerated well.     Penicillins Hives    Lisinopril      Angioedema        Family History:  family history includes Cancer in his father; Heart disease in his brother; Kidney disease in his brother; No Known Problems in his brother and daughter.  He indicated that his mother is deceased. He indicated that his father is deceased. He indicated that all of his three brothers are alive. He indicated that his daughter is alive. He indicated that the status of his neg hx is unknown.      REVIEW OF SYSTEMS:  A comprehensive review of 14 systems was negative except for pertinent positives noted in HPI.    Vital signs for this encounter: VS reviewed in EPIC.  GEN: Awake and alert & in no acute distress  PSYCH: Alert  and oriented to person, place and time. Euthymic.  HEENT: Pupils equally round without scleral icterus. No facial asymmetry.  CV: Regular rhythm with normal rate, no murmurs.  LUNGS: clear  ABD: Distended  EXT: 3+ BLE edema noted of the lower extremities  NEURO: Nonfocal        Lab Results   Component Value Date    CREATININE 1.92 (H) 02/17/2023     Lab Results   Component Value Date    ALKPHOS 78 02/09/2023    BILITOT 0.4 02/09/2023    BILIDIR 0.20 08/11/2021    PROT 7.5 02/09/2023    PROT 7.5 02/09/2023    PROT 7.5 02/09/2023    ALBUMIN 3.5 02/09/2023    ALBUMIN 3.5 02/09/2023    ALT <7 (L) 02/09/2023    AST 12 02/09/2023       Fredirick Lathe, MD   Hospice and Palliative fellowship Fellow

## 2023-05-12 DIAGNOSIS — C61 Malignant neoplasm of prostate: Principal | ICD-10-CM

## 2023-05-19 ENCOUNTER — Ambulatory Visit: Admit: 2023-05-19 | Discharge: 2023-05-20 | Payer: MEDICARE

## 2023-05-19 DIAGNOSIS — K5989 Chronic intestinal pseudo-obstruction: Principal | ICD-10-CM

## 2023-05-19 DIAGNOSIS — I10 Essential (primary) hypertension: Principal | ICD-10-CM

## 2023-05-19 DIAGNOSIS — Z8639 Personal history of other endocrine, nutritional and metabolic disease: Principal | ICD-10-CM

## 2023-05-19 DIAGNOSIS — N1832 Stage 3b chronic kidney disease (CKD) (CMS-HCC): Principal | ICD-10-CM

## 2023-05-19 DIAGNOSIS — Z9189 Other specified personal risk factors, not elsewhere classified: Principal | ICD-10-CM

## 2023-05-19 MED ORDER — CALCIUM 600 MG (AS CARBONATE)-VITAMIN D3 10 MCG (400 UNIT) TABLET
ORAL_TABLET | Freq: Every day | ORAL | 11 refills | 30.00 days | Status: CP
Start: 2023-05-19 — End: 2024-05-18

## 2023-05-19 MED ORDER — SPIRONOLACTONE 25 MG TABLET
ORAL_TABLET | Freq: Every day | ORAL | 11 refills | 30.00 days | Status: CP
Start: 2023-05-19 — End: 2024-05-18
  Filled 2023-05-19: qty 30, 30d supply, fill #0

## 2023-05-19 NOTE — Unmapped (Addendum)
Start taking spironolactone once per day.    Stop taking the potassium supplement.    Start taking a calcium carbonate and vitamin D supplement, either the prescription one or an over the counter one.     I recommend that you get the RSV vaccine at your local pharmacy.

## 2023-05-19 NOTE — Unmapped (Signed)
Internal Medicine Clinic Visit    Reason for visit: 3 month follow up    A/P:         1. Primary hypertension    2. History of hypokalemia    3. Chronic intestinal pseudo-obstruction    4. Stage 3b chronic kidney disease (CKD) (CMS-HCC)    5. At risk for osteoporosis        1. HTN  BP average 155/92 today. On carvedilol 25 mg BID monotherapy. Previously on hydrochlorothiazide, but this was discontinued earlier in 2024 due to AKI. 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 Will start spironolactone to address blood pressure, help with volume overload, and prevent future hypokalemia.  - Continue carvedilol 25 mg BID  - Start spironolactone 25 mg daily  - Follow up in 2-4 weeks with repeat labs    2. History of hypokalemia  K is 4.2 on 12/12, improved from 3.2 on 9/12. Continues to take KCl 20 mEq daily.  His requirement for potassium supplementation despite having been off of HCTZ suggests possible hyperaldosteronism. Will stop K supplementation because starting spironolactone.   - Stop potassium supplementation    3. Chronic intestinal pseudo-obstruction  Patient is not taking any anticholinergic medications and uses opioids sparingly. GI e-consult recommended in-person evaluation and empiric treatment for bacterial overgrowth. He took a 14 day course of rifaximin in September, with no significant improvement.   - Scheduled to see GI in April 2025    4. CKD Stage3bA2  Most recent creatinine 1.62 on 12/12, eGFR 44. Established care with Nephrology in September 2024, who expressed concern that his colonic dilation could be contributing to CKD by increasing intra-abdominal pressure to the point that venous return is compromised.    - Nephrology follow up in March 2025    5. Metastatic prostate cancer, on androgen deprivation therapy  Heme/onc recommends calcium and vitamin D supplementation for bone health. He takes a daily multivitamin, but no specific calcium or vitamin D supplementation  - Prescribed Caltrate 600-D at heme/onc recommendation    6. Health maintenance  UTD on PCV-13, PPV-23, COVID-19 and influenza vaccines. Last DTaP was in 2014..   - Discussed RSV vaccine, recommended obtaining at local pharmacy.     Return in about 4 weeks (around 06/16/2023).    Staffed with Dr. 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 He was last seen on 9/20.    His abdominal distention continues to be bothersome. He took the 2 week course of rifaximin recommended by GI e-consult, with no noticeable improvement. He continues to have 1-3 bowel movements per day, all formed. He has not been eating any more than usual (and intermittently has a low appetite), but has gained 10 lbs in the past 3 months.     His lower extremity lymphedema has been improving with regular use of compression stockings  __________________________________________________________    Medications:  Reviewed in EPIC  __________________________________________________________    Physical Exam:   Vital Signs:  Vitals:    05/19/23 1030   BP: 155/92   BP Site: L Wrist   BP Position: Sitting   BP Cuff Size: Small   Pulse: 69   Temp: 36.6 ??C (97.9 ??F)   TempSrc:  Temporal   SpO2: 98%   Weight: (!) 117.9 kg (260 lb)   Height: 180.3 cm (5' 11)          PTHomeBP    The patient???s Average Home Blood Pressure during the last two weeks is :   /   based on  readings      Gen: Alert, chronically ill appearing, no acute distress  CV: RRR, no murmurs  Pulm: Normal work of breathing on room air. CTA bilaterally, no crackles or wheezes  Abd: Significantly distended abdomen, nontender to palpation. Decreased bowel sounds.   Ext: 1-2+ lower edema of bilateral feet, wearing lymphedema leg wraps.       Medication adherence and barriers to the treatment plan have been addressed. Opportunities to optimize healthy behaviors have been discussed. Patient and caregiver voiced understanding.

## 2023-05-19 NOTE — Unmapped (Signed)
[facility] at [facility]     Reason for visit:  swelling     Questions / Concerns that need to be addressed: no        Omron BPs (complete if screening BP has a systolic  > 130 or diastolic > 80)  BP#1  164/96  70  BP#2  149/90 68  BP#3  152/91 70    Average BP  155/92 69  (please note this as a comment in vitals)   HCDM reviewed and updated in Epic:    We are working to make sure all of our patients??? wishes are updated in Epic and part of that is documenting a Environmental health practitioner for each patient  A Health Care Decision Maker is someone you choose who can make health care decisions for you if you are not able - who would you most want to do this for you????  is already up to date.    HCDM (patient stated preference): Adam Hoover, Adam Hoover - Daughter - [phone]    __________________________________________________________________________________________    SCREENINGS COMPLETED IN FLOWSHEETS      AUDIT       PHQ2       PHQ9          GAD7       COPD Assessment       Falls Risk

## 2023-06-09 DIAGNOSIS — C61 Malignant neoplasm of prostate: Principal | ICD-10-CM

## 2023-06-09 NOTE — Unmapped (Signed)
[facility] Clinical Assessment & Refill Coordination Note    Adam Hoover, [place]: 1945/11/03  Phone: [phone] (home)     All above HIPAA information was verified with patient's family member, daughter.     Was a Nurse, learning disability used for this call? No    Specialty Medication(s):   Hematology/Oncology: [name]     Current Outpatient Medications   Medication Sig Dispense Refill    apixaban (ELIQUIS) 5 mg Tab Take 1 tablet by mouth two (2) times a day. 60 tablet 11    calcium carbonate-vitamin D3 600 mg-10 mcg (400 unit) per tablet Take 1 tablet by mouth daily. 30 tablet 11    carvedilol (COREG) 25 MG tablet Take 1 tablet (25 mg total) by mouth two (2) times a day. 180 tablet 3    darolutamide (NUBEQA) 300 mg tablet Take 2 tablets (600 mg total) by mouth in the morning and 2 tablets (600 mg total) in the evening. Take with meals. Take with food. Swallow tablets whole. 120 tablet 11    naloxone (NARCAN) 4 mg nasal spray One spray in either nostril once for known/suspected opioid overdose. May repeat every 2-3 minutes in alternating nostril til EMS arrives 2 each 0    oxyCODONE (ROXICODONE) 5 MG immediate release tablet Take 1/2 to 1 tablet (2.5-5 mg total) by mouth every eight (8) hours as needed for pain. 60 tablet 0    spironolactone (ALDACTONE) 25 MG tablet Take 1 tablet (25 mg total) by mouth daily. 30 tablet 11     No current facility-administered medications for this visit.        Changes to medications: Adam Hoover Reports stopping the following medications: Potassium & Mag    Allergies   Allergen Reactions    Acetaminophen Hives     Per pt report Only, tolerated well.     Penicillins Hives    Lisinopril      Angioedema        Changes to allergies: No    SPECIALTY MEDICATION ADHERENCE     Nubeqa 300 mg: 7 days of medicine on hand       Medication Adherence    Patient reported X missed doses in the last month: 0  Specialty Medication: Nubeqa 600 mg twice daily  Patient is on additional specialty medications: No  Informant: child/children          Specialty medication(s) dose(s) confirmed: Regimen is correct and unchanged.     Are there any concerns with adherence? No    Adherence counseling provided? Not needed    CLINICAL MANAGEMENT AND INTERVENTION      Clinical Benefit Assessment:    Do you feel the medicine is effective or helping your condition? Patient declined to answer    Clinical Benefit counseling provided? Not needed    Adverse Effects Assessment:    Are you experiencing any side effects? No    Are you experiencing difficulty administering your medicine? No    Quality of Life Assessment:    Quality of Life    Rheumatology  Oncology  1. What impact has your specialty medication had on the reduction of your daily pain or discomfort level?: None  2. On a scale of 1-10, how would you rate your ability to manage side effects associated with your specialty medication? (1=no issues, 10 = unable to take medication due to side effects): 1  Dermatology  Cystic Fibrosis          How many days  over the past month did your condition  keep you from your normal activities? For example, brushing your teeth or getting up in the morning. Patient declined to answer    Have you discussed this with your provider? Not needed    Acute Infection Status:    Acute infections noted within Epic:  No active infections  Patient reported infection: None    Therapy Appropriateness:    Is therapy appropriate based on current medication list, adverse reactions, adherence, clinical benefit and progress toward achieving therapeutic goals? Yes, therapy is appropriate and should be continued     DISEASE/MEDICATION-SPECIFIC INFORMATION      N/A    Oncology: Is the patient receiving adequate infection prevention treatment? Not applicable  Does the patient have adequate nutritional support? Not applicable    PATIENT SPECIFIC NEEDS     Does the patient have any physical, cognitive, or cultural barriers? No    Is the patient high risk? Yes, patient is taking oral chemotherapy. Appropriateness of therapy as been assessed    Did the patient require a clinical intervention? No    Does the patient require physician intervention or other additional services (i.e., nutrition, smoking cessation, social work)? No    SOCIAL DETERMINANTS OF HEALTH     At the [facility], we have learned that life circumstances - like trouble affording food, housing, utilities, or transportation can affect the health of many of our patients.   That is why we wanted to ask: are you currently experiencing any life circumstances that are negatively impacting your health and/or quality of life? No    Social Drivers of Health     Food Insecurity: No Food Insecurity (07/08/2022)    Hunger Vital Sign     Worried About Running Out of Food in the Last Year: Never true     Ran Out of Food in the Last Year: Never true   Internet Connectivity: No Internet connectivity concern identified (07/08/2022)    Internet Connectivity     Do you have access to internet services: Yes     How do you connect to the internet: Personal Device at home     Is your internet connection strong enough for you to watch video on your device without major problems?: Yes     Do you have enough data to get through the month?: Yes     Does at least one of the devices have a camera that you can use for video chat?: Yes   Housing/Utilities: Low Risk  (07/08/2022)    Housing/Utilities     Within the past 12 months, have you ever stayed: outside, in a car, in a tent, in an overnight shelter, or temporarily in someone else's home (i.e. couch-surfing)?: No     Are you worried about losing your housing?: No     Within the past 12 months, have you been unable to get utilities (heat, electricity) when it was really needed?: No   Tobacco Use: Medium Risk (05/19/2023)    Patient History     Smoking Tobacco Use: Former     Smokeless Tobacco Use: Never     Passive Exposure: Never   Transportation Needs: No Transportation Needs (07/08/2022)    PRAPARE - Therapist, art (Medical): No     Lack of Transportation (Non-Medical): No   Alcohol Use: Alcohol Misuse (12/02/2022)    Alcohol Use     How often do you have a drink containing alcohol?:  4+ times per week     How many drinks containing alcohol do you have on a typical day when you are drinking?: 3 - 4     How often do you have 5 or more drinks on one occasion?: Daily or almost daily   Interpersonal Safety: Not At Risk (07/08/2022)    Interpersonal Safety     Unsafe Where You Currently Live: No     Physically Hurt by Anyone: No     Abused by Anyone: No   Physical Activity: Unknown (07/08/2022)    Exercise Vital Sign     Days of Exercise per Week: 4 days     Minutes of Exercise per Session: Not on file   Intimate Partner Violence: Not At Risk (07/08/2022)    Humiliation, Afraid, Rape, and Kick questionnaire     Fear of Current or Ex-Partner: No     Emotionally Abused: No     Physically Abused: No     Sexually Abused: No   Stress: No Stress Concern Present (07/08/2022)    [employer] of Occupational Health - Occupational Stress Questionnaire     Feeling of Stress : Not at all   Substance Use: Medium Risk (07/08/2022)    Substance Use     In the past year, how often have you used prescription drugs for non-medical reasons?: Never     In the past year, how often have you used illegal drugs?: Once or Twice Yearly     In the past year, have you used any substance for non-medical reasons?: Yes   Social Connections: Moderately Isolated (07/08/2022)    Social Connection and Isolation Panel [NHANES]     Frequency of Communication with Friends and Family: More than three times a week     Frequency of Social Gatherings with Friends and Family: Three times a week     Attends Religious Services: 1 to 4 times per year     Active Member of Clubs or Organizations: No     Attends Banker Meetings: Never     Marital Status: Separated   Programmer, applications: Low Risk  (07/08/2022)    Overall Financial Resource Strain (CARDIA)     Difficulty of Paying Living Expenses: Not hard at all   Depression: Not at risk (07/08/2022)    PHQ-2     PHQ-2 Score: 0   Health Literacy: Low Risk  (07/08/2022)    Health Literacy     : Never       Would you be willing to receive help with any of the needs that you have identified today? Not applicable       SHIPPING     Specialty Medication(s) to be Shipped:   Hematology/Oncology: Nubeqa    Other medication(s) to be shipped: No additional medications requested for fill at this time     Changes to insurance: No    Delivery Scheduled: Yes, Expected medication delivery date: 06/13/23.     Medication will be delivered via Next Day Courier to the confirmed prescription address in [facility].    The patient will receive a drug information handout for each medication shipped and additional FDA Medication Guides as required.  Verified that patient has previously received a Conservation officer, historic buildings and a Surveyor, mining.    The patient or caregiver noted above participated in the development of this care plan and knows that they can request review of or adjustments to the care plan at any time.  All of the patient's questions and concerns have been addressed.    Briasia Flinders Vangie Bicker, PharmD   [facility] Specialty Pharmacist

## 2023-06-12 MED FILL — NUBEQA 300 MG TABLET: ORAL | 30 days supply | Qty: 120 | Fill #3

## 2023-06-16 MED FILL — SPIRONOLACTONE 25 MG TABLET: ORAL | 30 days supply | Qty: 30 | Fill #1

## 2023-07-06 NOTE — Unmapped (Signed)
Internal Medicine Clinic Visit    Reason for visit: Follow up for blood pressure and electrolyte check    A/P:         1. Primary hypertension    2. Chronic intestinal pseudo-obstruction    3. Stage 3b chronic kidney disease (CMS-HCC)        1. HTN, now well-controlled  BP 131/70 today. Had been on caredilol 25 mg BID monotherapy, added spironolactone 25 mg daily in December to address BP, help with volume overload, and prevent future hypoK. He had previously required on Kcl 20 mEq daily, which has been held. If no significant change to his potassium level or creatinine, will continue current medication regimen.   - Check Na, K, Cr today  - Continue carvedilol 25 mg BID  - Continue spironolactone 25 mg daily     2. Chronic intestinal pseudo-obstruction  Patient is not taking any anticholinergic medications and uses opioids sparingly. GI e-consult recommended in-person evaluation and empiric treatment for bacterial overgrowth. He took a 14 day course of rifaximin in September, with no significant improvement.   - Scheduled to see GI in April 2025     3. CKD Stage3bA2  Most recent creatinine 1.62 on 12/12, eGFR 44. Established care with Nephrology in September 2024, who expressed concern that his colonic dilation could be contributing to CKD by increasing intra-abdominal pressure to the point that venous return is compromised.    - Nephrology follow up in March 2025     4. Health maintenance  UTD on PCV-13, PPV-23, COVID-19 and influenza vaccines. Last DTaP was in 2014.  - Discussed RSV vaccine, recommended obtaining at local pharmacy.     Return in about 3 months (around 10/04/2023).    Staffed with Dr. 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 He was last seen by me on 12/20.     At that time, his blood pressure was elevated and he was on carvedilol monotherapy. (Prior history of severe angioedema to [facility]). Started him on spironolactone 25 mg daily. Blood pressure is now normal. He does note a slight increase in urination, but it is not bothersome. He denies any lightheadedness upon standing. He has lost 10 lbs since December, some of which he attributes to eating a healthier diet and some of which is water weight.     His legs remain swollen from his chronic lymphedema. He has lymphedema wraps that he will wear sometimes, but he will take them off when he feels they cause too much pressure. He is not wearing them today. He does not want me to take off his mid-calf socks to look at them, but states that they have not been red or painful or itchy and are at baseline.   __________________________________________________________    Medications:  Reviewed in EPIC  __________________________________________________________    Physical Exam:   Vital Signs:  Vitals:    07/07/23 1056   BP: 131/70   BP Site: L Arm   BP Position: Sitting   BP Cuff Size: Medium   Pulse: 64   Resp: 18   Temp: 35.7 ??C (96.3 ??F)   TempSrc: Temporal   SpO2: 99%   Weight: (!) 115.5 kg (254 lb 9.6 oz)   Height: 180.3 cm (5' 11)  PTHomeBP    The patient???s Average Home Blood Pressure during the last two weeks is :   /   based on  readings    Gen: Well appearing, NAD  CV: RRR, no murmurs  Pulm: CTA bilaterally, no crackles or wheezes  Abd: Significantly distended abdomen, nontender to palpation. Decreased bowel sounds.  Ext: BLE appear edematous to palpation, mid-calf socks not removed to further evaluate at his request    PHQ-9 Score:     GAD-7 Score:       Medication adherence and barriers to the treatment plan have been addressed. Opportunities to optimize healthy behaviors have been discussed. Patient and caregiver voiced understanding.

## 2023-07-07 ENCOUNTER — Ambulatory Visit: Admit: 2023-07-07 | Discharge: 2023-07-08 | Payer: MEDICARE

## 2023-07-07 DIAGNOSIS — I10 Essential (primary) hypertension: Principal | ICD-10-CM

## 2023-07-07 LAB — CREATININE
CREATININE: 2.25 mg/dL — ABNORMAL HIGH (ref 0.73–1.18)
EGFR CKD-EPI (2021) MALE: 30 mL/min/{1.73_m2} — ABNORMAL LOW (ref >=60)

## 2023-07-07 LAB — SODIUM: SODIUM: 139 mmol/L (ref 135–145)

## 2023-07-07 LAB — POTASSIUM: POTASSIUM: 2.7 mmol/L — ABNORMAL LOW (ref 3.4–4.8)

## 2023-07-07 MED ORDER — POTASSIUM CHLORIDE ER 20 MEQ TABLET,EXTENDED RELEASE(PART/CRYST)
ORAL_TABLET | Freq: Every day | ORAL | 3 refills | 90.00 days | Status: CP
Start: 2023-07-07 — End: 2024-07-06
  Filled 2023-07-14: qty 90, 90d supply, fill #0

## 2023-07-07 NOTE — Unmapped (Signed)
[facility] at [facility]     Reason for visit: Follow up    Questions / Concerns that need to be addressed:     Screening BP 131/70 P 64    Omron BPs (complete if screening BP has a systolic  > 130 or diastolic > 80)  BP#1    BP#2   BP#3     Average BP   (please note this as a comment in vitals)     PTHomeBP           Dexcom or Libre CGM in use? If so, pull appropriate reporting through portal (Dexcom) or EPIC order Josephine Igo).    HCDM reviewed and updated in Epic:    We are working to make sure all of our patients??? wishes are updated in Epic and part of that is documenting a Environmental health practitioner for each patient  A Health Care Decision Maker is someone you choose who can make health care decisions for you if you are not able - who would you most want to do this for you????  was updated.    HCDM (patient stated preference): Beckett, Maden - Daughter - [phone]    BPAs completed:  Diabetes - urine albumin/creatinine ratio    Annual Screenings:   Tobacco and Falls - 65+  __________________________________________________________________________________________    SCREENINGS COMPLETED IN FLOWSHEETS      AUDIT       PHQ2       PHQ9          GAD7       COPD Assessment       Falls Risk

## 2023-07-07 NOTE — Unmapped (Addendum)
It was good to see you again! I will call Adam Hoover with the results and if any changes to your medications need to be made.

## 2023-07-10 DIAGNOSIS — C61 Malignant neoplasm of prostate: Principal | ICD-10-CM

## 2023-07-10 DIAGNOSIS — Z8639 Personal history of other endocrine, nutritional and metabolic disease: Principal | ICD-10-CM

## 2023-07-10 DIAGNOSIS — R799 Abnormal finding of blood chemistry, unspecified: Principal | ICD-10-CM

## 2023-07-12 DIAGNOSIS — E876 Hypokalemia: Principal | ICD-10-CM

## 2023-07-12 NOTE — Unmapped (Deleted)
Spoke with patient's daughter [name]. She will bring him to the lab at [facility] on Friday for potassium recheck.

## 2023-07-12 NOTE — Unmapped (Signed)
Spoke with patient's daughter [name]. He took the extra potassium supplementation as instructed over the weekend and is now taking the 20 mEq supplement daily again. She will bring him to the lab at [facility] on Friday to have potassium level rechecked.

## 2023-07-14 ENCOUNTER — Ambulatory Visit: Admit: 2023-07-14 | Discharge: 2023-07-15 | Payer: MEDICARE

## 2023-07-14 DIAGNOSIS — Z8639 Personal history of other endocrine, nutritional and metabolic disease: Principal | ICD-10-CM

## 2023-07-14 DIAGNOSIS — R799 Abnormal finding of blood chemistry, unspecified: Principal | ICD-10-CM

## 2023-07-14 LAB — MAGNESIUM: MAGNESIUM: 1.7 mg/dL (ref 1.6–2.6)

## 2023-07-14 MED FILL — SPIRONOLACTONE 25 MG TABLET: ORAL | 30 days supply | Qty: 30 | Fill #2

## 2023-07-14 MED FILL — CARVEDILOL 25 MG TABLET: ORAL | 90 days supply | Qty: 180 | Fill #3

## 2023-07-21 DIAGNOSIS — C61 Malignant neoplasm of prostate: Principal | ICD-10-CM

## 2023-07-21 NOTE — Unmapped (Signed)
 [facility] Clinical Assessment & Refill Coordination Note    Adam Hoover, [place]: Jan 20, 1946  Phone: [phone] (home)     All above HIPAA information was verified with patient's family member, daughter.     Was a Nurse, learning disability used for this call? No    Specialty Medication(s):   Hematology/Oncology: Adam Hoover     Current Outpatient Medications   Medication Sig Dispense Refill    apixaban (ELIQUIS) 5 mg Tab Take 1 tablet by mouth two (2) times a day. 60 tablet 11    calcium carbonate-vitamin D3 600 mg-10 mcg (400 unit) per tablet Take 1 tablet by mouth daily. 30 tablet 11    carvedilol (COREG) 25 MG tablet Take 1 tablet (25 mg total) by mouth two (2) times a day. 180 tablet 3    darolutamide (NUBEQA) 300 mg tablet Take 2 tablets (600 mg total) by mouth in the morning and 2 tablets (600 mg total) in the evening. Take with meals. Take with food. Swallow tablets whole. 120 tablet 11    oxyCODONE (ROXICODONE) 5 MG immediate release tablet Take 1/2 to 1 tablet (2.5-5 mg total) by mouth every eight (8) hours as needed for pain. 60 tablet 0    potassium chloride 20 mEq TbER Take one tablet by mouth once daily 90 tablet 3    spironolactone (ALDACTONE) 25 MG tablet Take 1 tablet (25 mg total) by mouth daily. 30 tablet 11     No current facility-administered medications for this visit.        Changes to medications: Adam Hoover reports starting the following medications: spironolactone    Medication list has been reviewed and updated in Epic: Yes    Allergies   Allergen Reactions    Acetaminophen Hives     Per pt report Only, tolerated well.     Penicillins Hives    Lisinopril      Angioedema        Changes to allergies: No    Allergies have been reviewed and updated in Epic: Yes    SPECIALTY MEDICATION ADHERENCE     Nubeqa 300 mg: 7 days of medicine on hand     Medication Adherence    Patient reported X missed doses in the last month: 0  Specialty Medication: darolutamide 600 mg twice daily  Patient is on additional specialty medications: No  Informant: child/children  Confirmed plan for next specialty medication refill: delivery by pharmacy  Refills needed for supportive medications: not needed          Specialty medication(s) dose(s) confirmed: Regimen is correct and unchanged.     Are there any concerns with adherence? No - reports no missed doses.  SHD last filled 30 day supply on 06/11/23    Adherence counseling provided? Not needed    CLINICAL MANAGEMENT AND INTERVENTION      Clinical Benefit Assessment:    Do you feel the medicine is effective or helping your condition? Yes    Clinical Benefit counseling provided? Not needed    Adverse Effects Assessment:    Are you experiencing any side effects? No    Are you experiencing difficulty administering your medicine? No    Quality of Life Assessment:    Quality of Life    Rheumatology  Oncology  1. What impact has your specialty medication had on the reduction of your daily pain or discomfort level?: None  2. On a scale of 1-10, how would you rate your ability to manage side  effects associated with your specialty medication? (1=no issues, 10 = unable to take medication due to side effects): 1  Dermatology  Cystic Fibrosis          How many days over the past month did your prostate cancer  keep you from your normal activities? For example, brushing your teeth or getting up in the morning. 0    Have you discussed this with your provider? Not needed    Acute Infection Status:    Acute infections noted within Epic:  No active infections  Patient reported infection: None    Therapy Appropriateness:    Is therapy appropriate based on current medication list, adverse reactions, adherence, clinical benefit and progress toward achieving therapeutic goals? Yes, therapy is appropriate and should be continued     DISEASE/MEDICATION-SPECIFIC INFORMATION      N/A    Oncology: Is the patient receiving adequate infection prevention treatment? Not applicable  Does the patient have adequate nutritional support? Not applicable    PATIENT SPECIFIC NEEDS     Does the patient have any physical, cognitive, or cultural barriers? No    Is the patient high risk? No    Did the patient require a clinical intervention? No    Does the patient require physician intervention or other additional services (i.e., nutrition, smoking cessation, social work)? No    Does the patient have an additional or emergency contact listed in their chart? Yes    SOCIAL DETERMINANTS OF HEALTH     At the [facility], we have learned that life circumstances - like trouble affording food, housing, utilities, or transportation can affect the health of many of our patients.   That is why we wanted to ask: are you currently experiencing any life circumstances that are negatively impacting your health and/or quality of life? Patient declined to answer    Social Drivers of Health     Food Insecurity: No Food Insecurity (07/08/2022)    Hunger Vital Sign     Worried About Running Out of Food in the Last Year: Never true     Ran Out of Food in the Last Year: Never true   Internet Connectivity: No Internet connectivity concern identified (07/08/2022)    Internet Connectivity     Do you have access to internet services: Yes     How do you connect to the internet: Personal Device at home     Is your internet connection strong enough for you to watch video on your device without major problems?: Yes     Do you have enough data to get through the month?: Yes     Does at least one of the devices have a camera that you can use for video chat?: Yes   Housing/Utilities: Unknown (07/10/2023)    Housing/Utilities     Within the past 12 months, have you ever stayed: outside, in a car, in a tent, in an overnight shelter, or temporarily in someone else's home (i.e. couch-surfing)?: No     Are you worried about losing your housing?: Not on file     Within the past 12 months, have you been unable to get utilities (heat, electricity) when it was really needed?: Not on file   Tobacco Use: Medium Risk (07/07/2023)    Patient History     Smoking Tobacco Use: Former     Smokeless Tobacco Use: Never     Passive Exposure: Never   Transportation Needs: No Transportation Needs (07/08/2022)    PRAPARE -  Therapist, art (Medical): No     Lack of Transportation (Non-Medical): No   Alcohol Use: Alcohol Misuse (12/02/2022)    Alcohol Use     How often do you have a drink containing alcohol?: 4+ times per week     How many drinks containing alcohol do you have on a typical day when you are drinking?: 3 - 4     How often do you have 5 or more drinks on one occasion?: Daily or almost daily   Interpersonal Safety: Not At Risk (07/08/2022)    Interpersonal Safety     Unsafe Where You Currently Live: No     Physically Hurt by Anyone: No     Abused by Anyone: No   Physical Activity: Unknown (07/08/2022)    Exercise Vital Sign     Days of Exercise per Week: 4 days     Minutes of Exercise per Session: Not on file   Intimate Partner Violence: Not At Risk (07/08/2022)    Humiliation, Afraid, Rape, and Kick questionnaire     Fear of Current or Ex-Partner: No     Emotionally Abused: No     Physically Abused: No     Sexually Abused: No   Stress: No Stress Concern Present (07/08/2022)    [employer] - Occupational Stress Questionnaire     Feeling of Stress : Not at all   Substance Use: Not on file (07/10/2023)   Social Connections: Moderately Isolated (07/08/2022)    Social Connection and Isolation Panel [NHANES]     Frequency of Communication with Friends and Family: More than three times a week     Frequency of Social Gatherings with Friends and Family: Three times a week     Attends Religious Services: 1 to 4 times per year     Active Member of Clubs or Organizations: No     Attends Banker Meetings: Never     Marital Status: Separated   Programmer, applications: Low Risk  (07/08/2022)    Overall Financial Resource Strain (CARDIA) Difficulty of Paying Living Expenses: Not hard at all   Depression: Not at risk (07/08/2022)    PHQ-2     PHQ-2 Score: 0   Health Literacy: Low Risk  (07/10/2023)    Health Literacy     : Never       Would you be willing to receive help with any of the needs that you have identified today? Not applicable       SHIPPING     Specialty Medication(s) to be Shipped:   Hematology/Oncology: Nubeqa    Other medication(s) to be shipped: No additional medications requested for fill at this time     Changes to insurance: No    Delivery Scheduled: Yes, Expected medication delivery date: 07/26/23.     Medication will be delivered via Next Day Courier to the confirmed prescription address in [facility]    The patient will receive a drug information handout for each medication shipped and additional FDA Medication Guides as required.  Verified that patient has previously received a Conservation officer, historic buildings and a Surveyor, mining.    The patient or caregiver noted above participated in the development of this care plan and knows that they can request review of or adjustments to the care plan at any time.      All of the patient's questions and concerns have been addressed.    Kahari Critzer A  Jacques Navy, [facility]   [facility] Specialty Pharmacist

## 2023-07-25 MED FILL — NUBEQA 300 MG TABLET: ORAL | 30 days supply | Qty: 120 | Fill #4

## 2023-07-28 MED FILL — ELIQUIS 5 MG TABLET: ORAL | 30 days supply | Qty: 60 | Fill #3
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
## 2023-08-04 ENCOUNTER — Ambulatory Visit: Admit: 2023-08-04 | Discharge: 2023-08-04 | Payer: MEDICARE

## 2023-08-04 ENCOUNTER — Ambulatory Visit: Admit: 2023-08-04 | Discharge: 2023-08-04 | Payer: MEDICARE | Attending: Nephrology | Primary: Nephrology

## 2023-08-04 DIAGNOSIS — K5989 Chronic intestinal pseudo-obstruction: Principal | ICD-10-CM

## 2023-08-04 DIAGNOSIS — N1832 Chronic kidney disease, stage 3b (CMS-HCC): Principal | ICD-10-CM

## 2023-08-04 DIAGNOSIS — C61 Malignant neoplasm of prostate: Principal | ICD-10-CM

## 2023-08-04 DIAGNOSIS — C7951 Secondary malignant neoplasm of bone: Principal | ICD-10-CM

## 2023-08-04 DIAGNOSIS — I10 Essential (primary) hypertension: Principal | ICD-10-CM

## 2023-08-04 LAB — RENAL FUNCTION PANEL
ALBUMIN: 3.1 g/dL — ABNORMAL LOW (ref 3.4–5.0)
ANION GAP: 14 mmol/L (ref 5–14)
CALCIUM: 9.5 mg/dL (ref 8.7–10.4)
CHLORIDE: 105 mmol/L (ref 98–107)
CO2: 16.3 mmol/L — ABNORMAL LOW (ref 20.0–31.0)
GLUCOSE RANDOM: 120 mg/dL (ref 70–179)
PHOSPHORUS: 4 mg/dL (ref 2.4–5.1)
SODIUM: 135 mmol/L (ref 135–145)

## 2023-08-04 LAB — MAGNESIUM: MAGNESIUM: 2.1 mg/dL (ref 1.6–2.6)

## 2023-08-04 NOTE — Unmapped (Signed)
 Addended by: Collier Flowers on: 08/04/2023 01:22 PM     Modules accepted: Orders

## 2023-08-09 DIAGNOSIS — C61 Malignant neoplasm of prostate: Principal | ICD-10-CM

## 2023-08-10 ENCOUNTER — Ambulatory Visit
Admit: 2023-08-10 | Discharge: 2023-08-11 | Payer: MEDICARE | Attending: Nurse Practitioner | Primary: Nurse Practitioner

## 2023-08-10 ENCOUNTER — Other Ambulatory Visit: Admit: 2023-08-10 | Discharge: 2023-08-11 | Payer: MEDICARE

## 2023-08-10 DIAGNOSIS — C61 Malignant neoplasm of prostate: Principal | ICD-10-CM

## 2023-08-10 DIAGNOSIS — C7951 Secondary malignant neoplasm of bone: Principal | ICD-10-CM

## 2023-08-10 DIAGNOSIS — N1832 Stage 3b chronic kidney disease (CMS-HCC): Principal | ICD-10-CM

## 2023-08-10 LAB — COMPREHENSIVE METABOLIC PANEL
ALBUMIN: 3.1 g/dL — ABNORMAL LOW (ref 3.4–5.0)
ALKALINE PHOSPHATASE: 83 U/L (ref 46–116)
ALT (SGPT): <7 U/L — ABNORMAL LOW (ref 10–49)
ANION GAP: 17 mmol/L — ABNORMAL HIGH (ref 5–14)
AST (SGOT): 10 U/L (ref <=34)
BILIRUBIN TOTAL: 0.3 mg/dL (ref 0.3–1.2)
BLOOD UREA NITROGEN: 55 mg/dL — ABNORMAL HIGH (ref 9–23)
BUN / CREAT RATIO: 16
CALCIUM: 9.4 mg/dL (ref 8.7–10.4)
CHLORIDE: 104 mmol/L (ref 98–107)
CO2: 19 mmol/L — ABNORMAL LOW (ref 20.0–31.0)
CREATININE: 3.35 mg/dL — ABNORMAL HIGH (ref 0.73–1.18)
EGFR CKD-EPI (2021) MALE: 18 mL/min/{1.73_m2} — ABNORMAL LOW (ref >=60)
GLUCOSE RANDOM: 112 mg/dL (ref 70–179)
POTASSIUM: 3.3 mmol/L — ABNORMAL LOW (ref 3.5–5.1)
PROTEIN TOTAL: 7.6 g/dL (ref 5.7–8.2)
SODIUM: 140 mmol/L (ref 135–145)

## 2023-08-10 LAB — CBC W/ AUTO DIFF
BASOPHILS ABSOLUTE COUNT: 0 10*9/L (ref 0.0–0.1)
BASOPHILS RELATIVE PERCENT: 0.4 %
EOSINOPHILS ABSOLUTE COUNT: 0.1 10*9/L (ref 0.0–0.5)
EOSINOPHILS RELATIVE PERCENT: 2.2 %
HEMATOCRIT: 29.1 % — ABNORMAL LOW (ref 39.0–48.0)
HEMOGLOBIN: 9.7 g/dL — ABNORMAL LOW (ref 12.9–16.5)
LYMPHOCYTES ABSOLUTE COUNT: 1.1 10*9/L (ref 1.1–3.6)
LYMPHOCYTES RELATIVE PERCENT: 21.7 %
MEAN CORPUSCULAR HEMOGLOBIN CONC: 33.3 g/dL (ref 32.0–36.0)
MEAN CORPUSCULAR HEMOGLOBIN: 30 pg (ref 25.9–32.4)
MEAN CORPUSCULAR VOLUME: 90.3 fL (ref 77.6–95.7)
MEAN PLATELET VOLUME: 8.1 fL (ref 6.8–10.7)
MONOCYTES ABSOLUTE COUNT: 0.8 10*9/L (ref 0.3–0.8)
MONOCYTES RELATIVE PERCENT: 14.8 %
NEUTROPHILS ABSOLUTE COUNT: 3.2 10*9/L (ref 1.8–7.8)
NEUTROPHILS RELATIVE PERCENT: 60.9 %
PLATELET COUNT: 268 10*9/L (ref 150–450)
RED BLOOD CELL COUNT: 3.22 10*12/L — ABNORMAL LOW (ref 4.26–5.60)
RED CELL DISTRIBUTION WIDTH: 16.4 % — ABNORMAL HIGH (ref 12.2–15.2)
WBC ADJUSTED: 5.3 10*9/L (ref 3.6–11.2)

## 2023-08-10 LAB — PSA: PROSTATE SPECIFIC ANTIGEN: 10.5 ng/mL — ABNORMAL HIGH (ref 0.00–4.00)

## 2023-08-10 NOTE — Unmapped (Addendum)
 Shot again in 3 months  PSA has gone up some, but we will keep an eye on it. If it continues to rise we will repeat scans  REDUCE the cancer medication to 1 pill every 12 hours (this is due to kidney function number)  Follow up with your primary care provider and other doctors  Drink more water and double void (pee then go back in 15 minutes and pee again)  calcium and vitamin D for bone health  STOP the spirinolactone per your kidney doctor's recommendation    Lab Results   Component Value Date    PSA 10.50 (H) 08/10/2023    PSA 5.98 (H) 05/11/2023    PSA 6.78 (H) 02/09/2023    PSA 6.42 (H) 11/07/2022    PSA 6.54 (H) 09/08/2022    PSA 8.61 (H) 05/09/2022         I have recommended calcium and vitamin D supplementation for bone health. Calcium and Vitamin D supplementation comes in different forms. Unlike calcium carbonate, calcium citrate does not require acid for absorption and can be taken with and without food. It is preferred for patients taking a proton pump inhibitor (PPI) such as omeprazole or an H2-receptor antagonist such as ranitidine. Calcium carbonate should be taken with food to enhance absorption. Calcium supplements are generally well tolerated. Constipation, bloating and gas can occur as well as a slightly higher risk of kidney stones.    Examples of calcium and vitamin D supplements include (I encourage you to discuss with your pharmacist since generic is not a problem):    Calcium Carbonate  Caltrate 600-D (includes 600 mg elemental calcium and 800 IU vitamin D3 per tablet) - take 2 tablets daily      [name], NP-C, OCN  Adult Nurse Practitioner  Urology and Medical Oncology    Notice: Many test results will be automatically released into MyChart. This may happen before your provider has a chance to review them. Your results will either be reviewed with you at your scheduled appointment or your provider or someone from their office will reach out to you to discuss the results. Thank you for your understanding    If you have been prescribed a medication today, always read the package insert that comes with the medication.    In the event of an emergency, always call 911    Urology:  Main clinic & [place]: [phone]  Fax: [phone]    [facility]:  Phone: [phone]  Fax: [phone]    After hours/nights/weekends:  Hospital Operator: [phone]    [email or web address]  [email or web address]
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
## 2023-08-11 DIAGNOSIS — C61 Malignant neoplasm of prostate: Principal | ICD-10-CM

## 2023-08-23 DIAGNOSIS — C61 Malignant neoplasm of prostate: Principal | ICD-10-CM

## 2023-08-23 NOTE — Unmapped (Signed)
 The [facility] has made a second and final attempt to reach this patient to refill the following medication:Nubeqa.      We have left voicemails on the following phone numbers: [phone], have been unable to leave messages on the following phone numbers: [phone], have sent a MyChart message, and have sent a text message to the following phone numbers: [phone] .    Dates contacted: 3/17,26  Last scheduled delivery: 07/25/23    The patient may be at risk of non-compliance with this medication. The patient should call the [facility] at [phone]  Option 4, then Option 1: Oncology to refill medication.    [facility] Specialty Technician

## 2023-09-01 ENCOUNTER — Ambulatory Visit: Admit: 2023-09-01 | Discharge: 2023-09-02

## 2023-09-01 DIAGNOSIS — K638219 Small intestinal bacterial overgrowth (SIBO): Principal | ICD-10-CM

## 2023-09-01 DIAGNOSIS — C61 Malignant neoplasm of prostate: Principal | ICD-10-CM
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
## 2023-09-01 NOTE — Unmapped (Signed)
 [facility]  Phone: [phone]         After Visit Instructions:  -- Will touch base with nephrologist about electrolytes  -- Please watch out for nausea, vomiting, constipation, abdominal pain, fevers, or chills    -- I will see you back in clinic in as needed. If you need to contact me, you may either send me a MyChart message or call the office and ask to speak with Zella Ball (the nurse that works with me). MyChart messages are for non-urgent questions and may take up to 5 days to receive a response.     -------------------------------------------------------------------------------------------------------  Thank you for visiting our Gastroenterology Clinic!    Please fill out this survey to help [place] improve our physician services. This survey is about your experience with your clinic doctor today. All answers are confidential. Your physician will not know who completed this survey. You can access the survey using the QR code, below, or by visiting: [email or web address]         APPOINTMENT SCHEDULING FOR GI CLINIC AND GI PROCEDURES:  GI MEDICINE CLINIC  [phone] option 1     GI PROCEDURES         [phone] option 2   *To schedule, reschedule, or cancel your GI appointment, please call [phone]. If you are unable to come to an appointment, please notify us as soon as possible, preferably 24 hours in advance. Doing so may allow other patients with urgent needs to be scheduled in a cancelled appointment slot.     RADIOLOGY - to schedule your imaging procedure, please call [phone] opt 1     TEST RESULTS   If you have a MyChart account, your new results will be sent to you through your MyChart account at [email or web address]. For results that require follow-up, a member of your healthcare team will also contact you directly.    PRESCRIPTION REFILL REQUESTS  To request prescription refills, please contact your pharmacy or send your healthcare team a message through your MyChart account at [email or web address]    RECORD REQUESTS  For questions related to medical records, please call Medical Records Release of Information at [phone]

## 2023-09-06 DIAGNOSIS — C61 Malignant neoplasm of prostate: Principal | ICD-10-CM

## 2023-09-08 DIAGNOSIS — G893 Neoplasm related pain (acute) (chronic): Principal | ICD-10-CM

## 2023-09-08 MED ORDER — OXYCODONE 5 MG TABLET
ORAL_TABLET | Freq: Three times a day (TID) | ORAL | 0 refills | 20.00 days | Status: CP | PRN
Start: 2023-09-08 — End: ?
  Filled 2023-09-08: qty 60, 20d supply, fill #0

## 2023-09-08 NOTE — Unmapped (Signed)
 Oxycodone refill

## 2023-09-08 NOTE — Unmapped (Signed)
 Addended by: Yamile Roedl on: 09/08/2023 10:35 AM     Modules accepted: Orders

## 2023-09-15 DIAGNOSIS — G893 Neoplasm related pain (acute) (chronic): Principal | ICD-10-CM

## 2023-09-15 DIAGNOSIS — C61 Malignant neoplasm of prostate: Principal | ICD-10-CM

## 2023-09-15 MED ORDER — OXYCODONE 10 MG TABLET
ORAL_TABLET | Freq: Four times a day (QID) | ORAL | 0 refills | 6.00 days | Status: CP | PRN
Start: 2023-09-15 — End: 2023-09-21
  Filled 2023-09-15: qty 24, 6d supply, fill #0

## 2023-09-15 NOTE — Unmapped (Signed)
 Spoke with Adam Hoover.  Sharing that her dad has a history of a chronic cough and currently the cough is worsening and most recently is having pain with coughing-this is new in the last couple days including to his trach and upper abd.  His legs have been bothering him as well and that is why he is taking more oxycodone  that has been prescribed.    Adam Hoover is sharing that her dad is not having shortness of breath, no chest pain, no headache.  Adam Hoover is eating okay and no problems with bowel movements.    He is on Eliquis  5 mg twice a day for history of PE and DVT in 2017.    NP shared concerned about having another PE despite being on Eliquis .  Possibility exists that the pain is from the prostate cancer with worsening bone mets.  Last PSA a month ago was elevated at 10.5 and prior reading had been 5.98    Plan  -Recommended to be seen in the ED to rule out PE.  -Patient is sharing that her dad may decline the ED visit.  -Prescription sent for oxycodone  on 10 mg every 6 hours as needed-6 day supply sent.  Dose increased from the oxycodone  5 mg every 8 hours as needed.  -Will connect with primary team as well for follow up visit.     Adam Schwalbe, FNP-BC, [facility]  Outpatient Oncology Palliative Care Service  [facility]  [address] [place], [place] [address]  [phone]

## 2023-09-20 DIAGNOSIS — C61 Malignant neoplasm of prostate: Principal | ICD-10-CM

## 2023-09-20 MED ORDER — NUBEQA 300 MG TABLET
ORAL_TABLET | Freq: Two times a day (BID) | ORAL | 11 refills | 30.00 days | Status: CP
Start: 2023-09-20 — End: ?

## 2023-09-20 NOTE — Unmapped (Signed)
 Upon investigation, one additional attempt for patient contact is warranted.  [facility] attempted to contact patient regarding Nubeqa  refills.  LVM on both home and cell phone as well as a text message to patient.  Also requested new rx from provider as patient is now on Nubeqa  300 mg twice daily per epic notes.Wilbern Hancock, [facility]  [facility] Specialty Pharmacist

## 2023-09-20 NOTE — Unmapped (Signed)
 Clinical Assessment Needed For: Dose Change  Medication: NUBEQA  300 mg tablet (darolutamide )  Last Fill Date/Day Supply: 07/25/23 / 30 days   Copay $0.00  Was previous dose already scheduled to fill: No    Notes to Pharmacist: 09/20/23

## 2023-09-29 ENCOUNTER — Ambulatory Visit: Admit: 2023-09-29 | Discharge: 2023-10-29 | Disposition: E | Payer: MEDICARE

## 2023-09-29 ENCOUNTER — Inpatient Hospital Stay: Admit: 2023-09-29 | Discharge: 2023-10-29 | Disposition: E | Payer: MEDICARE

## 2023-09-29 ENCOUNTER — Encounter
Admit: 2023-09-29 | Discharge: 2023-10-29 | Disposition: E | Payer: MEDICARE | Attending: Student in an Organized Health Care Education/Training Program

## 2023-09-29 LAB — URINALYSIS WITH MICROSCOPY WITH CULTURE REFLEX PERFORMABLE
BILIRUBIN UA: NEGATIVE
GLUCOSE UA: NEGATIVE
NITRITE UA: NEGATIVE
PH UA: 5.5 (ref 5.0–9.0)
PROTEIN UA: 50 — AB
RBC UA: 117 /HPF — ABNORMAL HIGH (ref <=3)
SPECIFIC GRAVITY UA: 1.014 (ref 1.003–1.030)
SQUAMOUS EPITHELIAL: 2 /HPF (ref 0–5)
UROBILINOGEN UA: <2
WBC UA: 162 /HPF — ABNORMAL HIGH (ref <=2)

## 2023-09-29 LAB — CBC W/ AUTO DIFF
BASOPHILS ABSOLUTE COUNT: 0 10*9/L (ref 0.0–0.1)
BASOPHILS RELATIVE PERCENT: 0.4 %
EOSINOPHILS ABSOLUTE COUNT: 0 10*9/L (ref 0.0–0.5)
EOSINOPHILS RELATIVE PERCENT: 0.1 %
HEMATOCRIT: 31.7 % — ABNORMAL LOW (ref 39.0–48.0)
HEMOGLOBIN: 10.1 g/dL — ABNORMAL LOW (ref 12.9–16.5)
LYMPHOCYTES ABSOLUTE COUNT: 0.6 10*9/L — ABNORMAL LOW (ref 1.1–3.6)
LYMPHOCYTES RELATIVE PERCENT: 7.1 %
MEAN CORPUSCULAR HEMOGLOBIN CONC: 31.9 g/dL — ABNORMAL LOW (ref 32.0–36.0)
MEAN CORPUSCULAR HEMOGLOBIN: 28 pg (ref 25.9–32.4)
MEAN CORPUSCULAR VOLUME: 87.7 fL (ref 77.6–95.7)
MEAN PLATELET VOLUME: 7.8 fL (ref 6.8–10.7)
MONOCYTES ABSOLUTE COUNT: 0.7 10*9/L (ref 0.3–0.8)
MONOCYTES RELATIVE PERCENT: 7.8 %
NEUTROPHILS ABSOLUTE COUNT: 7.3 10*9/L (ref 1.8–7.8)
NEUTROPHILS RELATIVE PERCENT: 84.6 %
PLATELET COUNT: 429 10*9/L (ref 150–450)
RED BLOOD CELL COUNT: 3.61 10*12/L — ABNORMAL LOW (ref 4.26–5.60)
RED CELL DISTRIBUTION WIDTH: 16 % — ABNORMAL HIGH (ref 12.2–15.2)
WBC ADJUSTED: 8.6 10*9/L (ref 3.6–11.2)

## 2023-09-29 LAB — BASIC METABOLIC PANEL
ANION GAP: 10 mmol/L (ref 5–14)
BLOOD UREA NITROGEN: 67 mg/dL — ABNORMAL HIGH (ref 9–23)
BUN / CREAT RATIO: 16
CALCIUM: 9.2 mg/dL (ref 8.7–10.4)
CHLORIDE: 113 mmol/L — ABNORMAL HIGH (ref 98–107)
CO2: 18 mmol/L — ABNORMAL LOW (ref 20.0–31.0)
CREATININE: 4.21 mg/dL — ABNORMAL HIGH (ref 0.73–1.18)
EGFR CKD-EPI (2021) MALE: 14 mL/min/1.73m2 — ABNORMAL LOW (ref >=60)
GLUCOSE RANDOM: 108 mg/dL (ref 70–179)
POTASSIUM: 5.1 mmol/L — ABNORMAL HIGH (ref 3.4–4.8)
SODIUM: 141 mmol/L (ref 135–145)

## 2023-09-29 LAB — BLOOD GAS CRITICAL CARE PANEL, VENOUS
BASE EXCESS VENOUS: -8.2 — ABNORMAL LOW (ref -2.0–2.0)
CALCIUM IONIZED VENOUS (MG/DL): 5.33 mg/dL (ref 4.40–5.40)
GLUCOSE WHOLE BLOOD: 103 mg/dL (ref 70–179)
HCO3 VENOUS: 19 mmol/L — ABNORMAL LOW (ref 22–27)
HEMOGLOBIN BLOOD GAS: 10.1 g/dL — ABNORMAL LOW (ref 13.50–17.50)
LACTATE BLOOD VENOUS: 1 mmol/L (ref 0.5–1.8)
O2 SATURATION VENOUS: 68.4 % (ref 40.0–85.0)
PCO2 VENOUS: 45 mmHg (ref 40–60)
PH VENOUS: 7.23 — ABNORMAL LOW (ref 7.32–7.43)
PO2 VENOUS: 42 mmHg (ref 30–55)
POTASSIUM WHOLE BLOOD: 5 mmol/L — ABNORMAL HIGH (ref 3.4–4.6)
SODIUM WHOLE BLOOD: 143 mmol/L (ref 135–145)

## 2023-09-29 LAB — HEPATIC FUNCTION PANEL
ALBUMIN: 2 g/dL — ABNORMAL LOW (ref 3.4–5.0)
ALKALINE PHOSPHATASE: 109 U/L (ref 46–116)
ALT (SGPT): 10 U/L (ref 10–49)
AST (SGOT): 35 U/L — ABNORMAL HIGH (ref <=34)
BILIRUBIN DIRECT: 0.1 mg/dL (ref 0.00–0.30)
BILIRUBIN TOTAL: 0.2 mg/dL — ABNORMAL LOW (ref 0.3–1.2)
PROTEIN TOTAL: 6.7 g/dL (ref 5.7–8.2)

## 2023-09-29 LAB — PROTEIN / CREATININE RATIO, URINE
CREATININE, URINE: 135.7 mg/dL
PROTEIN URINE: 113 mg/dL
PROTEIN/CREAT RATIO, URINE: 0.833

## 2023-09-29 LAB — BLOOD GAS, VENOUS
BASE EXCESS VENOUS: -12.2 — ABNORMAL LOW (ref -2.0–2.0)
HCO3 VENOUS: 14 mmol/L — ABNORMAL LOW (ref 22–27)
O2 SATURATION VENOUS: 68.9 % (ref 40.0–85.0)
PCO2 VENOUS: 34 mmHg — ABNORMAL LOW (ref 40–60)
PH VENOUS: 7.23 — ABNORMAL LOW (ref 7.32–7.43)
PO2 VENOUS: 43 mmHg (ref 30–55)

## 2023-09-29 LAB — SODIUM, URINE, RANDOM: SODIUM URINE: 82 mmol/L

## 2023-09-29 LAB — LACTATE, VENOUS, WHOLE BLOOD: LACTATE BLOOD VENOUS: 1 mmol/L (ref 0.5–1.8)

## 2023-09-29 LAB — HIGH SENSITIVITY TROPONIN I - SINGLE: HIGH SENSITIVITY TROPONIN I: 19 ng/L (ref <=53)

## 2023-09-29 LAB — CK: CREATINE KINASE TOTAL: 451 U/L — ABNORMAL HIGH (ref 46.0–171.0)

## 2023-09-29 MED ADMIN — oxyCODONE (ROXICODONE) immediate release tablet 5 mg: 5 mg | ORAL | @ 23:00:00 | Stop: 2023-09-29

## 2023-09-29 MED ADMIN — sodium chloride 0.9% (NS) bolus 1,000 mL: 1000 mL | INTRAVENOUS | @ 19:00:00 | Stop: 2023-09-29

## 2023-09-29 MED ADMIN — sodium chloride 0.9% (NS) bolus 500 mL: 500 mL | INTRAVENOUS | @ 23:00:00 | Stop: 2023-09-29

## 2023-09-29 MED ADMIN — vancomycin (VANCOCIN) 2000 mg IVPB in 500 mL sodium chloride 0.9% (premix): 2000 mg | INTRAVENOUS | @ 20:00:00 | Stop: 2023-09-29

## 2023-09-29 MED ADMIN — aztreonam (AZACTAM) 2 g in sodium chloride 0.9 % (NS) 100 mL IVPB-MBP: 2 g | INTRAVENOUS | @ 18:00:00 | Stop: 2023-09-29

## 2023-09-29 NOTE — Unmapped (Signed)
Bed: 25-B  Expected date:   Expected time:   Means of arrival:   Comments:  EMS

## 2023-09-29 NOTE — Unmapped (Signed)
 ED Progress Note    Received sign out from previous provider.    Patient Summary: Adam Hoover is a 78 y.o. male who presented to the ED with concerns for a fall that took place yesterday.  Patient was unable to get up so he laid on the floor overnight, currently endorses right hip pain.  Of note, history of atrial fibrillation and currently on Eliquis.  See initial ED provider note for further details.    Action List:   CT, labs and reassessment    Updates  ED Course as of 09/29/23 2252   Fri Sep 29, 2023   1540 Notified by radiology that there is a concerning finding on patient's CT cervical spine at approximately T3, noting that it appears to be a destructive lesion with possible epidural component.  Patient currently at x-ray, will reassess and update patient once he arrives back in his room.   1743 Patient has 4/5 strength in bilateral upper and lower extremities with sensation light touch intact.  Endorses chronic tingling numbness in both legs.  He has no tenderness to palpation of his thoracic spine.  Lower suspicion at this time for spinal cord compression syndrome but will obtain MRI to further evaluate for spinal epidural abscess.   2029 Urinalysis consistent with infection accompanied by hematuria.  Will page [facility] for admission at this time.  Currently pending MRI thoracic spine.   2046 Patient unable to sit still for MRI secondary to back pain.  Given degree of AKI and urinalysis concerning for infection, we will attempt to obtain CT abdomen pelvis without contrast to evaluate for infected obstructed kidney stone.  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  Patient signed out to inpatient provider for further management.

## 2023-09-29 NOTE — Unmapped (Signed)
 ED Procedure Note    Critical Care    Performed by: Cecilia Coe, MD  Authorized by: Cecilia Coe, MD    Critical care provider statement:     Critical care time (minutes):  40    Critical care time was exclusive of:  Teaching time and separately billable procedures and treating other patients    Critical care was necessary to treat or prevent imminent or life-threatening deterioration of the following conditions:  Sepsis    Critical care was time spent personally by me on the following activities:  Evaluation of patient's response to treatment, development of treatment plan with patient or surrogate, examination of patient, obtaining history from patient or surrogate, re-evaluation of patient's condition, pulse oximetry, ordering and review of radiographic studies, ordering and review of laboratory studies and ordering and performing treatments and interventions

## 2023-09-29 NOTE — Unmapped (Signed)
 Internal Medicine (MEDL) History & Physical    Assessment & Plan:   Adam Hoover is a 78 y.o. 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 UA on admission with large LE, large blood, 162 WBC, and many bacteria. Denies dysuria. He is not incontinent at baseline. Tmax 93 degrees F. Blood pressure minimally responsive to sepsis dose fluids. Given Aztreonam and Vancomycin in the ED. Reaching out to MICU for further assessment.   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 Started on ADT in 09/2020. Appears disease was relatively stable but PSA rose in 07/2023. Management has been complicated by worsening CKD of unknown etiology. Imaging on admission showed innumerable osteolytic lesions throughout both the axial and appendicular skeletons. CT Cervical Spine also showed a partially visualized destructive lesion involving the T3 vertebral body with adjacent soft tissue component and likely some degree of epidural extension. Patient unable to lie flat for MRI.   - notify primary onc of admission  - continues on ADT w Eligard due again on 11/07/2023. Daroutamide reduced to 300 mg BID.   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 Concern that his colonic dilation could be contributing to his CKD by increasing intra-abdominal pressure to the point that venous return is compromised. Creatinine on admission 4.21 from a baseline of 2-3. Suspect this has a pre-renal component.   - fluids as above   - urine studies pending  - urine osm pending   - consider nephro consult   - avoid nephrotoxic medications    Goals of Care - Deconditioning - Fall - Chronic Lymphedema   Related to age, lower extremity wounds, metastatic prostate cancer, pain. Admission today related to patient walking to go to the restroom yesterday afternoon when he felt his legs give out from under him. He denied any feelings of palpitations or lightheadedness prior to the fall. He denied any head strike or loss of consciousness. Reports he was just unable to get up. Currently lives alone. Daughter says he is independent in all his ADLs. Appears he was previously recommended for SNF but declined.  - PT/OT    Atrial Fibrillation - History of PE  Patient on Carvedilol and Eliquis at home.   - hold in setting of acute illness     Hypertension  -  hold home Carvedilol iso acute illness    Chronic Colonic Dilitation   Follows with [facility].   - Stable  - continue bowel regimen     The patient's presentation is complicated by the following clinically significant conditions requiring additional evaluation and treatment: - Hypercoagulable state requiring additional attention to DVT prophylaxis and treatment or chronic anticoagulation secondary to Atrial Fibrillation , Malignancy , and Prior PE/DVT  - Chronic kidney disease POA requiring further investigation, treatment, or monitoring   - Age related debility POA requiring additional resources: DME, PT, or OT  - Complex social situation/SDOH requiring additional support and resources: - Poor support as a result of problems related to living alone and/or to social environment   - Metastatic cancer POA requiring further investigation, treatment, or monitoring     Checklist:  Diet: Regular Diet  DVT PPx: Patient Already on Full Anticoagulation with Eliquis  Code Status: DNR and DNI  Dispo: Patient appropriate for Inpatient based on expectation of ongoing need for hospitalization greater than two midnights based on severity of presentation/services including sepsis     Team Contact Information:   Primary Team: Internal Medicine (MEDL)  Primary Resident: Carvel Clarity, MD  Resident's Pager: [phone] [facility])    Chief Concern:   Sepsis with acute organ dysfunction      Subjective:   Adam Hoover is a 78 y.o. 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      History obtained by patient.     HPI:    Per patient report, he was walking to the bathroom yesterday and his legs gave out from under him. Denied any chest pain, shortness of breath, or loss of consciousness. He did not his head. He reports trying to get up but was unable to due to weakness. His phone was also out of reach.    In the ED, rectal temperature of 92 degree with systolics in the high 80s. Labs notable for K of 5.1, Bicarb 18, BUN 67, Creatinine 4.2, CK 451. pH 7.23 on venous blood gas with pCO2 of 45. Received sepsis fluids, Aztreonam, and Vancomycin. Paged out for admission.       Pertinent Surgical Hx  Past Surgical History:   Procedure Laterality Date    COSMETIC SURGERY      FACIAL RECONSTRUCTION SURGERY      HERNIA REPAIR  05/2014    peri-umbilical    INNER EAR SURGERY      PR BRONCHOSCOPY,DIAGNOSTIC N/A 09/27/2012    Procedure: BRONCHOSCOPY, RIGID OR FLEXIBLE, W/WO FLUOROSCOPIC GUIDANCE; DIAGNOSTIC, WITH CELL WASHING, WHEN PERFORMED;  Surgeon: Genella Kendall, MD;  Location: MAIN OR [facility];  Service: ENT    PR BRONCHOSCOPY,TRACH/BRONCH DILATN Midline 03/17/2016    Procedure: BRONCHOSCOPY, RIGID/FLEXIBLE, INCL FLUOROSCOPIC GUIDANCE; W/TRACHIAL/BRONCH DILATION OR CLOSED REDUCTION FX;  Surgeon: Armando Lance, MD;  Location: MAIN OR [place];  Service: ENT    PR COLONOSCOPY FLX DX W/COLLJ SPEC WHEN PFRMD N/A 04/17/2015    Procedure: COLONOSCOPY, FLEXIBLE, PROXIMAL TO SPLENIC FLEXURE; DIAGNOSTIC, W/WO COLLECTION SPECIMEN BY BRUSH OR WASH;  Surgeon: Roylene Corn, MD;  Location: [facility] OR [facility];  Service: Gastroenterology    PR COLONOSCOPY FLX DX W/COLLJ SPEC WHEN PFRMD N/A 04/05/2019    Procedure: COLONOSCOPY, FLEXIBLE, PROXIMAL TO SPLENIC FLEXURE; DIAGNOSTIC, W/WO COLLECTION SPECIMEN BY BRUSH OR WASH;  Surgeon: Robyne Christen  Sheria Dills, MD;  Location: [facility];  Service: Gastroenterology    PR LARYNGOSCOPY,DIRCT,OP [facility] TUMR Midline 03/17/2016    Procedure: LARYNGOSCOPY, DIRECT, OPERATIVE, W/EXCISION TUMOR &/OR STRIPPING VOCAL CORD/EPIGLOTTIS; W/OPERA MICRO/TELES;  Surgeon: Armando Lance, MD;  Location: MAIN OR [place];  Service: ENT    PR LARYNGOSCOPY,DIRECT,DX,OP MICROSCOP N/A 09/27/2012    Procedure: LARYNGOSCOPY DIRECT WITH OR WITHOUT TRACHEOSCPY; DIAGNOSTIC, WITH OPERATING MICROSCOPE OR TELESCOPE;  Surgeon: Genella Kendall, MD;  Location: MAIN OR [facility];  Service: ENT    PR LARYNGOSCOPY,DIRECT,DX,OP MICROSCOP Midline 07/17/2015    Procedure: LARYNGOSCOPY DIRECT WITH OR WITHOUT TRACHEOSCPY; DIAGNOSTIC, WITH OPERATING MICROSCOPE OR TELESCOPE;  Surgeon: Monte Antonio, MD;  Location: MAIN OR [facility];  Service: ENT    PR TRACHEOSTOMY,EMERG,XTRACH Midline 07/17/2015    Procedure: TRACHEOSTOMY EMERGENCY PROCEDURE; TRANSTRACHEAL;  Surgeon: Monte Antonio, MD;  Location: MAIN OR [facility];  Service: ENT         Pertinent Family Hx  Family History   Problem Relation Age of Onset    Cancer Father     Kidney disease Brother     No Known Problems Daughter     Heart disease Brother     No Known Problems Brother     Diabetes Neg Hx     Heart failure Neg Hx          Pertinent Social Hx   Social History     Socioeconomic History    Marital status: Single   Tobacco Use    Smoking status: Former     Current packs/day: 0.00     Average packs/day: 0.3 packs/day for 20.0 years (5.0 ttl pk-yrs)     Types: Cigarettes     Start date: 05/30/1992     Quit date: 12/21/2005     Years since quitting: 17.7     Passive exposure: Never    Smokeless tobacco: Never    Tobacco comments:     pt states he has cut that out. states he has not smoked in a while.   Vaping Use    Vaping status: Never Used   Substance and Sexual Activity    Alcohol use: Yes     Alcohol/week: 10.0 standard drinks of alcohol     Types: 6 Cans of beer, 4 Shots of liquor per week     Comment: 10/14/20- reports he hasn't had a drink in over 1 month. Previously: pt said he drinks about 1 pint of liquor weekly    Drug use: No     Types: Cocaine     Comment: pt said he last used cocaine 20 yrs ago     Social Drivers of Psychologist, prison and probation services Strain: Low Risk  (07/08/2022)    Overall Financial Resource Strain (CARDIA)     Difficulty of Paying Living Expenses: Not hard at all   Food Insecurity: No Food Insecurity (07/08/2022)    Hunger Vital Sign     Worried About Running Out of Food in the Last Year: Never true     Ran Out of Food in the Last Year: Never true   Transportation Needs: No Transportation Needs (07/08/2022)    PRAPARE - Therapist, art (Medical): No     Lack of Transportation (Non-Medical): No   Physical Activity: Unknown (07/08/2022)    Exercise Vital Sign     Days of Exercise per Week: 4 days   Stress: No Stress Concern Present (07/08/2022)  [employer] of Occupational Health - Occupational Stress Questionnaire     Feeling of Stress : Not at all   Social Connections: Moderately Isolated (07/08/2022)    Social Connection and Isolation Panel [NHANES]     Frequency of Communication with Friends and Family: More than three times a week     Frequency of Social Gatherings with Friends and Family: Three times a week     Attends Religious Services: 1 to 4 times per year     Active Member of Clubs or Organizations: No     Attends Banker Meetings: Never     Marital Status: Separated   Housing: Unknown (07/10/2023)    Housing     Within the past 12 months, have you ever stayed: outside, in a car, in a tent, in an overnight shelter, or temporarily in someone else's home (i.e. couch-surfing)?: No           Allergies  Acetaminophen, Penicillins, and Lisinopril    I reviewed the Medication List. The current list is Accurate  Prior to Admission medications    Medication Dose, Route, Frequency   apixaban (ELIQUIS) 5 mg Tab Take 1 tablet by mouth two (2) times a day.   calcium carbonate-vitamin D3 600 mg-10 mcg (400 unit) per tablet 1 tablet, Oral, Daily (standard)   carvedilol (COREG) 25 MG tablet 25 mg, Oral, 2 times a day (standard)   darolutamide (NUBEQA) 300 mg tablet 300 mg, Oral, 2 times a day with meals, Take with food. Swallow tablets whole   potassium chloride 20 mEq TbER Take one tablet by mouth once daily   spironolactone (ALDACTONE) 25 MG tablet 25 mg, Oral, Daily (standard)  Patient not taking: Reported on 09/01/2023       Designated Healthcare Decision Maker:  Mr. Mathe currently has decisional capacity for healthcare decision-making and is able to designate a surrogate healthcare decision maker. Mr. Brunswick designated healthcare decision maker(s) is ALISHA (the patient's adult child) as denoted by stated patient preference.    Objective:   Physical Exam:  Temp:  [34.1 ??C (93.4 ??F)-34.4 ??C (93.9 ??F)] 34.4 ??C (93.9 ??F)  Pulse:  [73-87] 87  SpO2 Pulse:  [73-87] 86  Resp:  [11-22] 19  BP: (81-112)/(49-73) 90/53  SpO2:  [93 %-98 %] 94 %    Gen: NAD, cervical collar in place  Eyes: Sclera anicteric, EOMI grossly normal   HENT: Atraumatic, normocephalic  Neck: Trachea midline  Heart: RRR  Lungs: crackles bilaterally   Abdomen: Soft, mildly distended   Extremities: gross edema 3+, chronic venous stasis   Neuro: Grossly symmetric  Skin:  No rashes  Psych: Alert, oriented     Danice Dural, MD  PGY-2 Internal Medicine
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
## 2023-09-29 NOTE — Unmapped (Signed)
 Pt BIB [facility] from home for c/o fall OOB yesterday - unable to get up so laid in floor all night.. - HS, -LOC. C/o right hip pain. A&O x 4. On Eliquis.

## 2023-09-30 LAB — BLOOD GAS, ARTERIAL
BASE EXCESS ARTERIAL: -9.1 — ABNORMAL LOW (ref -2.0–2.0)
HCO3 ARTERIAL: 16 mmol/L — ABNORMAL LOW (ref 22–27)
O2 SATURATION ARTERIAL: 98.9 % (ref 94.0–100.0)
PCO2 ARTERIAL: 35.9 mmHg (ref 35.0–45.0)
PH ARTERIAL: 7.28 — ABNORMAL LOW (ref 7.35–7.45)
PO2 ARTERIAL: 158 mmHg — ABNORMAL HIGH (ref 80.0–110.0)

## 2023-09-30 LAB — PROTIME-INR
INR: 1.18
INR: 1.22
PROTIME: 13.4 s — ABNORMAL HIGH (ref 9.9–12.6)
PROTIME: 13.9 s — ABNORMAL HIGH (ref 9.9–12.6)

## 2023-09-30 LAB — COMPREHENSIVE METABOLIC PANEL
ALBUMIN: 1.9 g/dL — ABNORMAL LOW (ref 3.4–5.0)
ALBUMIN: 1.9 g/dL — ABNORMAL LOW (ref 3.4–5.0)
ALBUMIN: 2 g/dL — ABNORMAL LOW (ref 3.4–5.0)
ALKALINE PHOSPHATASE: 99 U/L (ref 46–116)
ALKALINE PHOSPHATASE: 103 U/L (ref 46–116)
ALKALINE PHOSPHATASE: 95 U/L (ref 46–116)
ALT (SGPT): 11 U/L (ref 10–49)
ALT (SGPT): 14 U/L (ref 10–49)
ALT (SGPT): 13 U/L (ref 10–49)
ANION GAP: 13 mmol/L (ref 5–14)
ANION GAP: 13 mmol/L (ref 5–14)
ANION GAP: 12 mmol/L (ref 5–14)
AST (SGOT): 37 U/L — ABNORMAL HIGH (ref <=34)
AST (SGOT): 35 U/L — ABNORMAL HIGH (ref <=34)
AST (SGOT): 34 U/L (ref <=34)
BILIRUBIN TOTAL: 0.2 mg/dL — ABNORMAL LOW (ref 0.3–1.2)
BILIRUBIN TOTAL: 0.2 mg/dL — ABNORMAL LOW (ref 0.3–1.2)
BILIRUBIN TOTAL: <0.2 mg/dL — ABNORMAL LOW (ref 0.3–1.2)
BLOOD UREA NITROGEN: 52 mg/dL — ABNORMAL HIGH (ref 9–23)
BLOOD UREA NITROGEN: 59 mg/dL — ABNORMAL HIGH (ref 9–23)
BLOOD UREA NITROGEN: 61 mg/dL — ABNORMAL HIGH (ref 9–23)
BUN / CREAT RATIO: 14
BUN / CREAT RATIO: 17
BUN / CREAT RATIO: 17
CALCIUM: 8.9 mg/dL (ref 8.7–10.4)
CALCIUM: 8.8 mg/dL (ref 8.7–10.4)
CALCIUM: 8.9 mg/dL (ref 8.7–10.4)
CHLORIDE: 113 mmol/L — ABNORMAL HIGH (ref 98–107)
CHLORIDE: 112 mmol/L — ABNORMAL HIGH (ref 98–107)
CHLORIDE: 116 mmol/L — ABNORMAL HIGH (ref 98–107)
CO2: 17 mmol/L — ABNORMAL LOW (ref 20.0–31.0)
CO2: 19 mmol/L — ABNORMAL LOW (ref 20.0–31.0)
CO2: 16 mmol/L — ABNORMAL LOW (ref 20.0–31.0)
CREATININE: 3.74 mg/dL — ABNORMAL HIGH (ref 0.73–1.18)
CREATININE: 3.55 mg/dL — ABNORMAL HIGH (ref 0.73–1.18)
CREATININE: 3.57 mg/dL — ABNORMAL HIGH (ref 0.73–1.18)
EGFR CKD-EPI (2021) MALE: 16 mL/min/1.73m2 — ABNORMAL LOW (ref >=60)
EGFR CKD-EPI (2021) MALE: 17 mL/min/1.73m2 — ABNORMAL LOW (ref >=60)
EGFR CKD-EPI (2021) MALE: 17 mL/min/1.73m2 — ABNORMAL LOW (ref >=60)
GLUCOSE RANDOM: 99 mg/dL (ref 70–179)
GLUCOSE RANDOM: 82 mg/dL (ref 70–179)
GLUCOSE RANDOM: 83 mg/dL (ref 70–179)
POTASSIUM: 4.9 mmol/L — ABNORMAL HIGH (ref 3.4–4.8)
POTASSIUM: 4.9 mmol/L — ABNORMAL HIGH (ref 3.4–4.8)
POTASSIUM: 5.1 mmol/L — ABNORMAL HIGH (ref 3.4–4.8)
PROTEIN TOTAL: 6.5 g/dL (ref 5.7–8.2)
PROTEIN TOTAL: 6.5 g/dL (ref 5.7–8.2)
PROTEIN TOTAL: 6.4 g/dL (ref 5.7–8.2)
SODIUM: 143 mmol/L (ref 135–145)
SODIUM: 144 mmol/L (ref 135–145)
SODIUM: 144 mmol/L (ref 135–145)

## 2023-09-30 LAB — CBC W/ AUTO DIFF
BASOPHILS ABSOLUTE COUNT: 0 10*9/L (ref 0.0–0.1)
BASOPHILS ABSOLUTE COUNT: 0 10*9/L (ref 0.0–0.1)
BASOPHILS ABSOLUTE COUNT: 0 10*9/L (ref 0.0–0.1)
BASOPHILS RELATIVE PERCENT: 0.2 %
BASOPHILS RELATIVE PERCENT: 0.3 %
BASOPHILS RELATIVE PERCENT: 0.6 %
EOSINOPHILS ABSOLUTE COUNT: 0 10*9/L (ref 0.0–0.5)
EOSINOPHILS ABSOLUTE COUNT: 0 10*9/L (ref 0.0–0.5)
EOSINOPHILS ABSOLUTE COUNT: 0 10*9/L (ref 0.0–0.5)
EOSINOPHILS RELATIVE PERCENT: 0.3 %
EOSINOPHILS RELATIVE PERCENT: 0.5 %
EOSINOPHILS RELATIVE PERCENT: 0.5 %
HEMATOCRIT: 32.9 % — ABNORMAL LOW (ref 39.0–48.0)
HEMATOCRIT: 31.6 % — ABNORMAL LOW (ref 39.0–48.0)
HEMATOCRIT: 29.4 % — ABNORMAL LOW (ref 39.0–48.0)
HEMOGLOBIN: 10.4 g/dL — ABNORMAL LOW (ref 12.9–16.5)
HEMOGLOBIN: 10.1 g/dL — ABNORMAL LOW (ref 12.9–16.5)
HEMOGLOBIN: 9.6 g/dL — ABNORMAL LOW (ref 12.9–16.5)
LYMPHOCYTES ABSOLUTE COUNT: 1.1 10*9/L (ref 1.1–3.6)
LYMPHOCYTES ABSOLUTE COUNT: 0.9 10*9/L — ABNORMAL LOW (ref 1.1–3.6)
LYMPHOCYTES ABSOLUTE COUNT: 0.7 10*9/L — ABNORMAL LOW (ref 1.1–3.6)
LYMPHOCYTES RELATIVE PERCENT: 13 %
LYMPHOCYTES RELATIVE PERCENT: 11.9 %
LYMPHOCYTES RELATIVE PERCENT: 8.2 %
MEAN CORPUSCULAR HEMOGLOBIN CONC: 31.5 g/dL — ABNORMAL LOW (ref 32.0–36.0)
MEAN CORPUSCULAR HEMOGLOBIN CONC: 32.1 g/dL (ref 32.0–36.0)
MEAN CORPUSCULAR HEMOGLOBIN CONC: 32.8 g/dL (ref 32.0–36.0)
MEAN CORPUSCULAR HEMOGLOBIN: 27.9 pg (ref 25.9–32.4)
MEAN CORPUSCULAR HEMOGLOBIN: 27.9 pg (ref 25.9–32.4)
MEAN CORPUSCULAR HEMOGLOBIN: 28.5 pg (ref 25.9–32.4)
MEAN CORPUSCULAR VOLUME: 88.6 fL (ref 77.6–95.7)
MEAN CORPUSCULAR VOLUME: 86.9 fL (ref 77.6–95.7)
MEAN CORPUSCULAR VOLUME: 86.9 fL (ref 77.6–95.7)
MEAN PLATELET VOLUME: 7.9 fL (ref 6.8–10.7)
MEAN PLATELET VOLUME: 7.7 fL (ref 6.8–10.7)
MEAN PLATELET VOLUME: 7.7 fL (ref 6.8–10.7)
MONOCYTES ABSOLUTE COUNT: 0.6 10*9/L (ref 0.3–0.8)
MONOCYTES ABSOLUTE COUNT: 0.6 10*9/L (ref 0.3–0.8)
MONOCYTES ABSOLUTE COUNT: 0.6 10*9/L (ref 0.3–0.8)
MONOCYTES RELATIVE PERCENT: 7.7 %
MONOCYTES RELATIVE PERCENT: 8.5 %
MONOCYTES RELATIVE PERCENT: 7.9 %
NEUTROPHILS ABSOLUTE COUNT: 6.5 10*9/L (ref 1.8–7.8)
NEUTROPHILS ABSOLUTE COUNT: 5.7 10*9/L (ref 1.8–7.8)
NEUTROPHILS ABSOLUTE COUNT: 6.7 10*9/L (ref 1.8–7.8)
NEUTROPHILS RELATIVE PERCENT: 78.8 %
NEUTROPHILS RELATIVE PERCENT: 78.8 %
NEUTROPHILS RELATIVE PERCENT: 82.8 %
PLATELET COUNT: 506 10*9/L — ABNORMAL HIGH (ref 150–450)
PLATELET COUNT: 484 10*9/L — ABNORMAL HIGH (ref 150–450)
PLATELET COUNT: 452 10*9/L — ABNORMAL HIGH (ref 150–450)
RED BLOOD CELL COUNT: 3.72 10*12/L — ABNORMAL LOW (ref 4.26–5.60)
RED BLOOD CELL COUNT: 3.63 10*12/L — ABNORMAL LOW (ref 4.26–5.60)
RED BLOOD CELL COUNT: 3.38 10*12/L — ABNORMAL LOW (ref 4.26–5.60)
RED CELL DISTRIBUTION WIDTH: 16.2 % — ABNORMAL HIGH (ref 12.2–15.2)
RED CELL DISTRIBUTION WIDTH: 15.9 % — ABNORMAL HIGH (ref 12.2–15.2)
RED CELL DISTRIBUTION WIDTH: 16 % — ABNORMAL HIGH (ref 12.2–15.2)
WBC ADJUSTED: 8.2 10*9/L (ref 3.6–11.2)
WBC ADJUSTED: 7.2 10*9/L (ref 3.6–11.2)
WBC ADJUSTED: 8.1 10*9/L (ref 3.6–11.2)

## 2023-09-30 LAB — BLOOD GAS CRITICAL CARE PANEL, VENOUS
BASE EXCESS VENOUS: -8.3 — ABNORMAL LOW (ref -2.0–2.0)
CALCIUM IONIZED VENOUS (MG/DL): 5.24 mg/dL (ref 4.40–5.40)
GLUCOSE WHOLE BLOOD: 83 mg/dL (ref 70–179)
HCO3 VENOUS: 17 mmol/L — ABNORMAL LOW (ref 22–27)
HEMOGLOBIN BLOOD GAS: 9.4 g/dL — ABNORMAL LOW (ref 13.50–17.50)
LACTATE BLOOD VENOUS: 0.6 mmol/L (ref 0.5–1.8)
O2 SATURATION VENOUS: 94.1 % — ABNORMAL HIGH (ref 40.0–85.0)
PCO2 VENOUS: 33 mmHg — ABNORMAL LOW (ref 40–60)
PH VENOUS: 7.32 (ref 7.32–7.43)
PO2 VENOUS: 75 mmHg — ABNORMAL HIGH (ref 30–55)
POTASSIUM WHOLE BLOOD: 4.9 mmol/L — ABNORMAL HIGH (ref 3.4–4.6)
SODIUM WHOLE BLOOD: 140 mmol/L (ref 135–145)

## 2023-09-30 LAB — BLOOD GAS, VENOUS
BASE EXCESS VENOUS: -6.9 — ABNORMAL LOW (ref -2.0–2.0)
HCO3 VENOUS: 20 mmol/L — ABNORMAL LOW (ref 22–27)
O2 SATURATION VENOUS: 37.1 % — ABNORMAL LOW (ref 40.0–85.0)
PCO2 VENOUS: 46 mmHg (ref 40–60)
PH VENOUS: 7.25 — ABNORMAL LOW (ref 7.32–7.43)
PO2 VENOUS: <31 mmHg (ref 30–55)

## 2023-09-30 LAB — MAGNESIUM
MAGNESIUM: 1.9 mg/dL (ref 1.6–2.6)
MAGNESIUM: 1.5 mg/dL — ABNORMAL LOW (ref 1.6–2.6)
MAGNESIUM: 1.7 mg/dL (ref 1.6–2.6)

## 2023-09-30 LAB — VANCOMYCIN, RANDOM: VANCOMYCIN RANDOM: 16.8 ug/mL

## 2023-09-30 LAB — PHOSPHORUS
PHOSPHORUS: 4.2 mg/dL (ref 2.4–5.1)
PHOSPHORUS: 3.8 mg/dL (ref 2.4–5.1)
PHOSPHORUS: 3.9 mg/dL (ref 2.4–5.1)

## 2023-09-30 LAB — CK: CREATINE KINASE TOTAL: 385 U/L — ABNORMAL HIGH (ref 46.0–171.0)

## 2023-09-30 LAB — APTT
APTT: 36.2 s (ref 24.8–38.4)
APTT: 35.4 s (ref 24.8–38.4)
HEPARIN CORRELATION: 0.2
HEPARIN CORRELATION: 0.2

## 2023-09-30 LAB — FIBRINOGEN: FIBRINOGEN LEVEL: 393 mg/dL (ref 175–500)

## 2023-09-30 LAB — TSH: THYROID STIMULATING HORMONE: 3.105 u[IU]/mL (ref 0.550–4.780)

## 2023-09-30 LAB — D-DIMER, QUANTITATIVE: D-DIMER QUANTITATIVE (ACL TOP): 1150 ng{FEU}/mL — ABNORMAL HIGH (ref <=500)

## 2023-09-30 LAB — LACTATE, ARTERIAL, WHOLE BLOOD: LACTATE BLOOD ARTERIAL: 0.7 mmol/L (ref <1.3)

## 2023-09-30 LAB — OSMOLALITY, RANDOM URINE: OSMOLALITY URINE: 391 mosm/kg

## 2023-09-30 LAB — HIGH SENSITIVITY TROPONIN I - SINGLE: HIGH SENSITIVITY TROPONIN I: 21 ng/L (ref <=53)

## 2023-09-30 LAB — CORTISOL: CORTISOL TOTAL: 22.6 ug/dL (ref >=1.5)

## 2023-09-30 MED ADMIN — sodium chloride 0.9% (NS) bolus 1,000 mL: 1000 mL | INTRAVENOUS | @ 02:00:00 | Stop: 2023-09-29

## 2023-09-30 MED ADMIN — sodium chloride 0.9% (NS) bolus 250 mL: 250 mL | INTRAVENOUS | @ 06:00:00 | Stop: 2023-09-30

## 2023-09-30 MED ADMIN — lactated ringers bolus 1,000 mL: 1000 mL | INTRAVENOUS | @ 04:00:00 | Stop: 2023-09-30

## 2023-09-30 MED ADMIN — NORepinephrine 8 mg in dextrose 5 % 250 mL (32 mcg/mL) infusion PMB: 0-30 ug/min | INTRAVENOUS | @ 18:00:00

## 2023-09-30 MED ADMIN — morphine 4 mg/mL injection 4 mg: 4 mg | INTRAVENOUS | Stop: 2023-09-29

## 2023-09-30 MED ADMIN — NORepinephrine bitartrate-D5W 8 mg/250 mL (32 mcg/mL) infusion: INTRAVENOUS | @ 18:00:00 | Stop: 2023-09-30

## 2023-09-30 MED ADMIN — fentaNYL (PF) (SUBLIMAZE) 50 mcg/mL injection: INTRAVENOUS | @ 02:00:00 | Stop: 2023-09-29

## 2023-09-30 MED ADMIN — oxyCODONE (ROXICODONE) immediate release tablet 2.5 mg: 2.5 mg | ORAL | @ 22:00:00 | Stop: 2023-10-07

## 2023-09-30 MED ADMIN — aztreonam (AZACTAM) 2 g in sodium chloride 0.9 % (NS) 100 mL IVPB-MBP: 2 g | INTRAVENOUS | @ 09:00:00 | Stop: 2023-09-30

## 2023-09-30 MED ADMIN — lactated ringers bolus 1,000 mL: 1000 mL | INTRAVENOUS | @ 17:00:00 | Stop: 2023-09-30

## 2023-09-30 MED ADMIN — cefTRIAXone (ROCEPHIN) 2 g in sodium chloride 0.9 % (NS) 100 mL IVPB-MBP: 2 g | INTRAVENOUS | @ 14:00:00 | Stop: 2023-09-30

## 2023-09-30 NOTE — Unmapped (Signed)
 Central Venous Catheter Insertion Procedure Note     Date of Service: 09/30/2023    Patient Name: Adam Hoover  Patient MRN: [id number]    Line type:  Triple Lumen    Indications:  Medications requiring central access    Consent:  I explained the potential benefits and risks of the procedure, including  catheter malposition, pneumothorax, cardiac complications, thrombosis, hematoma formation, arterial cannulation, air embolism local/catheter-related infection, nerve/tissue damage and sepsis. I explained potential alternatives. The patient/HCDM understands these risks, agrees to the procedure, and signed the informed consent form.    Procedure Details:   Time-out was performed immediately prior to the procedure.    The right internal jugular vein was identified using bedside ultrasound. This area was prepped and draped in the usual sterile fashion. Maximum sterile technique was used including antiseptics, cap, gloves, gown, hand hygiene, mask, and sterile sheet.  The patient was maintained in upright position. Local anesthesia with 1% lidocaine was applied subcutaneously then deep to the skin. The introducer was then inserted into the internal jugular vein using ultrasound guidance.    Using the Seldinger Technique a Triple Lumen was placed with each port easily flushed and freely drawing venous blood.    The catheter was secured with sutures. A sterile CHG drsg was applied to the site.    Condition:  The patient tolerated the procedure well and remains in the same condition as pre-procedure.    Complications:  None; patient tolerated the procedure well.    Plan:  CXR confirmed appropriate placement of central line. CVAD order placed OK to use     Armenta Landau, MD

## 2023-09-30 NOTE — Unmapped (Signed)
 Neuro: Pt alert and orientated x 4. Generalized Weakness and limited movement. Doppler lower extremities.     Resp: Admitted on 4L [place] with ongoing hacking cough. Weaned to 3L  with forehead pulse ox.      CV: Pt NSR with Levo at 4. A-line positional and redressed.      GI: Abdomen noted to be rounded and soft. Non-tender.      GU: Foley in place with adequate UOP.     MSK: BLE flaky and dry. Friction tear on bottom.      Events: Admitted this shift from [facility].  Family brought NUBEQA to bedside and pharmacy aware.    Patient and family updated to current condition and medications. Safety interventions in place as appropriate. Bed alarm on. No acute events over shift/Pt remains ICU status.     Problem: Adult Inpatient Plan of Care  Goal: Plan of Care Review  09/30/2023 1906 by Kathlee Pap, RN  Outcome: Not Progressing  Flowsheets (Taken 09/30/2023 1906)  Progress: no change  Plan of Care Reviewed With: patient  09/30/2023 1905 by Kathlee Pap, RN  Outcome: Not Progressing  Goal: Patient-Specific Goal (Individualized)  09/30/2023 1906 by Kathlee Pap, RN  Outcome: Not Progressing  09/30/2023 1905 by Jacolyn Matar D, RN  Outcome: Not Progressing  Goal: Absence of Hospital-Acquired Illness or Injury  09/30/2023 1906 by Kathlee Pap, RN  Outcome: Not Progressing  09/30/2023 1905 by Kathlee Pap, RN  Outcome: Not Progressing  Intervention: Identify and Manage Fall Risk  Recent Flowsheet Documentation  Taken 09/30/2023 1522 by Kathlee Pap, RN  Safety Interventions:   aspiration precautions   bed alarm   bleeding precautions   fall reduction program maintained   infection management   lighting adjusted for tasks/safety   low bed  Intervention: Prevent Skin Injury  Recent Flowsheet Documentation  Taken 09/30/2023 1800 by Jacolyn Matar D, RN  Positioning for Skin: Left  Taken 09/30/2023 1600 by Jacolyn Matar D, RN  Positioning for Skin: Right  Taken 09/30/2023 1522 by Jacolyn Matar D, RN  Positioning for Skin: Right  Device Skin Pressure Protection:   absorbent pad utilized/changed   adhesive use limited   tubing/devices free from skin contact  Skin Protection:   adhesive use limited   incontinence pads utilized   silicone foam dressing in place   tubing/devices free from skin contact  Intervention: Prevent and Manage VTE (Venous Thromboembolism) Risk  Recent Flowsheet Documentation  Taken 09/30/2023 1800 by Jacolyn Matar D, RN  Anti-Embolism Device Type: SCD, Knee  Anti-Embolism Device Status: On  Anti-Embolism Device Location: BLE  Taken 09/30/2023 1600 by Jacolyn Matar D, RN  Anti-Embolism Device Type: SCD, Knee  Anti-Embolism Device Status: On  Anti-Embolism Device Location: BLE  Taken 09/30/2023 1522 by Jacolyn Matar D, RN  Anti-Embolism Device Type: SCD, Knee  Anti-Embolism Device Status: Refused  Anti-Embolism Device Location: BLE  Intervention: Prevent Infection  Recent Flowsheet Documentation  Taken 09/30/2023 1522 by Jacolyn Matar D, RN  Infection Prevention:   cohorting utilized   environmental surveillance performed   equipment surfaces disinfected   hand hygiene promoted  Goal: Optimal Comfort and Wellbeing  09/30/2023 1906 by Kathlee Pap, RN  Outcome: Not Progressing  09/30/2023 1905 by Kathlee Pap, RN  Outcome: Not Progressing  Goal: Readiness for Transition of Care  09/30/2023 1906 by Kathlee Pap, RN  Outcome: Not Progressing  09/30/2023 1905 by Kathlee Pap, RN  Outcome: Not Progressing  Goal: Rounds/Family Conference  09/30/2023 1906 by Jacolyn Matar D, RN  Outcome: Not Progressing  09/30/2023 1905 by Kathlee Pap, RN  Outcome: Not Progressing     Problem: Wound  Goal: Optimal Coping  09/30/2023 1906 by Kathlee Pap, RN  Outcome: Not Progressing  09/30/2023 1905 by Kathlee Pap, RN  Outcome: Not Progressing  Goal: Optimal Functional Ability  09/30/2023 1906 by Kathlee Pap, RN  Outcome: Not Progressing  09/30/2023 1905 by Kathlee Pap, RN  Outcome: Not Progressing  Intervention: Optimize Functional Ability  Recent Flowsheet Documentation  Taken 09/30/2023 1800 by Kathlee Pap, RN  Activity Management: bedrest  Taken 09/30/2023 1600 by Jacolyn Matar D, RN  Activity Management: bedrest  Taken 09/30/2023 1522 by Kathlee Pap, RN  Activity Management: bedrest  Goal: Absence of Infection Signs and Symptoms  09/30/2023 1906 by Kathlee Pap, RN  Outcome: Not Progressing  09/30/2023 1905 by Kathlee Pap, RN  Outcome: Not Progressing  Intervention: Prevent or Manage Infection  Recent Flowsheet Documentation  Taken 09/30/2023 1522 by Jacolyn Matar D, RN  Infection Management: aseptic technique maintained  Goal: Improved Oral Intake  09/30/2023 1906 by Kathlee Pap, RN  Outcome: Not Progressing  09/30/2023 1905 by Kathlee Pap, RN  Outcome: Not Progressing  Goal: Optimal Pain Control and Function  09/30/2023 1906 by Kathlee Pap, RN  Outcome: Not Progressing  09/30/2023 1905 by Kathlee Pap, RN  Outcome: Not Progressing  Goal: Skin Health and Integrity  09/30/2023 1906 by Kathlee Pap, RN  Outcome: Not Progressing  09/30/2023 1905 by Kathlee Pap, RN  Outcome: Not Progressing  Intervention: Optimize Skin Protection  Recent Flowsheet Documentation  Taken 09/30/2023 1800 by Kathlee Pap, RN  Activity Management: bedrest  Head of Bed [facility]) Positioning: HOB at 60-90 degrees  Taken 09/30/2023 1600 by Kathlee Pap, RN  Activity Management: bedrest  Head of Bed [facility]) Positioning: HOB at 60-90 degrees  Taken 09/30/2023 1522 by Kathlee Pap, RN  Activity Management: bedrest  Pressure Reduction Techniques:   frequent weight shift encouraged   heels elevated off bed   weight shift assistance provided  Head of Bed (HOB) Positioning: HOB at 60-90 degrees  Pressure Reduction Devices:   pressure-redistributing mattress utilized   specialty bed utilized  Skin Protection:   adhesive use limited   incontinence pads utilized   silicone foam dressing in place   tubing/devices free from skin contact  Goal: Optimal Wound Healing  09/30/2023 1906 by Kathlee Pap, RN  Outcome: Not Progressing  09/30/2023 1905 by Kathlee Pap, RN  Outcome: Not Progressing

## 2023-09-30 NOTE — Unmapped (Signed)
 Received sign out from previous provider.    Patient Summary: Adam Hoover is a 78 y.o. male with a PMH of metastatic prostate cancer, CKD stage 3b, HTN, PE on Apixaban, kidney transplant, paroxysmal A-fib, and lymphedema who presented after a fall. Workup concerning for hypothermia and urosepsis in a kidney transplant recipient. Currently on antibiotics and bear hugger. A thoracic spine burst fracture was found on imaging; remains in cervical collar.  Action List:   Monitoring temp, MAPs  Pending admission    Updates  ED Course as of 09/30/23 2102   Fri Sep 29, 2023   2300 Assumed care of this patient    Sat Sep 30, 2023   0002 Patient with MAPs in the 86s. Repeat BP with MAP 64. Another 250cc bolus    0300 MAPs remaining stable   0700 Patient signed out to oncoming provider          Casilda Clayman, MD  Resident  09/30/23 [phone]

## 2023-09-30 NOTE — Unmapped (Signed)
 Arterial Line Insertion Procedure Note     Date of Service: 09/30/2023    Patient Name:: Adam Hoover  Patient MRN: [id number]    Indications: Hemodynamic monitoring    Consent:   I explained the potential benefits and risks of the procedure, including  temporary vascular occlusion, thrombosis, ischemia, hematoma formation, local/catheter-related infection and nerve/tissue damage. I explained potential alternatives. The patient/HCDM understands these risks, agrees to the procedure, and signed the informed consent form.    Procedure Details:   Time-out was performed immediately prior to the procedure to verify correct patient, procedure, site, positioning, and special equipment if applicable.    Leighton Punches???s test was performed to ensure adequate perfusion. There were prior attempts on the right wrist before I attempted.  The patient???s left wrist was prepped and draped in sterile fashion. 1% Lidocaine was used to anesthetize the area. A 20 G Arrow line was introduced into the radial artery with ultrasound guidance. The catheter was threaded over the guide wire and the needle was removed with appropriate pulsatile blood return. The catheter was then sutured in place to the skin and a sterile CHG dressing applied. Perfusion to the extremity distal to the point of catheter insertion was checked and found to be adequate.  A pressure transducer was connected sterilely to the arterial line and an arterial line waveform was noted on the monitor.     Estimated Blood Loss: 5 ml    Condition:  The patient tolerated the procedure well and remains in the same condition as pre-procedure.    Complications:  The patient tolerated the procedure well and there were no complications.    Plan:  - Hemodynamic monitoring     Ala How, MD
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
## 2023-09-30 NOTE — Unmapped (Signed)
 Vancomycin Therapeutic Monitoring Pharmacy Note    Adam Hoover is a 78 y.o. male continuing vancomycin. Date of therapy initiation: 09/29/23    Indication: Suspected infection     Prior Dosing Information: Received vancomycin 2000 mg x 1 in ED on 09/29/23 at 1534     Goals:  Therapeutic Drug Levels  Vancomycin trough goal: 10-15 mg/L    Additional Clinical Monitoring/Outcomes  Renal function, volume status (intake and output)    Results: Vancomycin level 16.8 mg/L, drawn appropriately as 24 hour random level    Wt Readings from Last 1 Encounters:   09/01/23 (!) 110 kg (242 lb 9.6 oz)     Creatinine   Date Value Ref Range Status   09/30/2023 3.57 (H) 0.73 - 1.18 mg/dL Final   16/02/9603 5.40 (H) 0.73 - 1.18 mg/dL Final   98/03/9146 8.29 (H) 0.73 - 1.18 mg/dL Final        Pharmacokinetic Considerations and Significant Drug Interactions:  Adult (estimated initial): Vd = 78.1 L, ke = 0.0196 hr-1  Concurrent nephrotoxic meds: not applicable    Assessment/Plan:  Recommendation(s)  Continue current regimen of vancomycin dosing by levels - give one time dose of vancomycin IV 2000 mg once  Estimated trough on recommended regimen: Not applicable - dosing by level    Follow-up  Level due: in 24 hours on 10/01/23 at 1900  A pharmacist will continue to monitor and order levels as appropriate    Please page service pharmacist with questions/clarifications.    Lance Galas M Lineth Thielke, PharmD

## 2023-09-30 NOTE — Unmapped (Signed)
 Clinical Pharmacy Non-Formulary Home Medication Evaluation Note     Team is requesting continuation of the patient's non-formulary home medication Darolutamide (NUBEQA). Per PolicyStat ID: [id number] (Medication Management: Patient-Supplied Medications,) the continued use of these medications during hospital admission was evaluated by pharmacy.      Recommendation: Continuation of non-formulary home medication Darolutamide is clinically necessary and will be identified by pharmacy for continuation. Per policy, patient-supplied medications must be supplied by the patient for the duration of the encounter.      Rationale: This medication was approved for inpatient use by Randell Bussing (clinical pharmacist). On each encounter, clinical appropriateness will be evaluated.    This information has been communicated to the ordering clinician. Pharmacy will continue to follow patient with team. Please page the service pharmacist pager found in the [facility] Directory if further immediate assistance is required.     **Home dose is 600 mg BID but due to renal function dose was decreased to 300 mg BID upon admission**    Thank you,     Jobe Mulder, PharmD

## 2023-09-30 NOTE — Unmapped (Signed)
 Vancomycin Therapeutic Monitoring Pharmacy Note    Adam Hoover is a 78 y.o. male starting vancomycin. Date of therapy initiation: 09/29/23    Indication: Suspected infection     Prior Dosing Information: Received vancomycin 2000 mg x 1 in ED on 09/29/23 at 1534      Goals:  Therapeutic Drug Levels  Vancomycin trough goal: 10-15 mg/L    Additional Clinical Monitoring/Outcomes  Renal function, volume status (intake and output)    Results: Not applicable    Wt Readings from Last 1 Encounters:   09/01/23 (!) 110 kg (242 lb 9.6 oz)     Creatinine   Date Value Ref Range Status   09/30/2023 3.55 (H) 0.73 - 1.18 mg/dL Final   16/02/9603 5.40 (H) 0.73 - 1.18 mg/dL Final   98/03/9146 8.29 (H) 0.73 - 1.18 mg/dL Final        Pharmacokinetic Considerations and Significant Drug Interactions:  Adult (estimated initial): Vd = 78.1 L, ke = 0.0196 hr-1  Concurrent nephrotoxic meds: not applicable    Assessment/Plan:  Recommendation(s)  Dosing by level - will obtain random level ~24 hours from last dose to guide further dosing given AKI  Estimated trough on recommended regimen: Not applicable - dosing by level    Follow-up  Level due: 09/30/23 at 1500  A pharmacist will continue to monitor and order levels as appropriate    Please page service pharmacist with questions/clarifications.    Thayer Finders, PharmD

## 2023-09-30 NOTE — Unmapped (Addendum)
 Adam Hoover is a 78 y.o. male with PMH metastatic prostate CA, CKD 3b, HTN, on PE, pAF, and lymphedema that presented to [facility] s/p fall with c/f urosepsis. Course unfortunately complicated by large aspiration event on 5/3 leading to acute respiratory failure and acidosis.  After extensive discussion with family regarding patient's poor prognosis and wishes to not be intubated, the patient's daughter and healthcare decision-maker elected to pursue comfort focused care.    Acute Hypoxic Respiratory Failure  Patient originally presented to the hospital on room air however quickly transition to nasal cannula for desaturations of unclear origin.  Chest x-ray obtained on 5/2 was not concerning for pneumonia.  CT abdomen and pelvis from 5/4 demonstrated consolidations in the right middle lobe concerning for pneumonia.  Patient was started on vancomycin and cefepime for pneumonia.  Unfortunately, his course was complicated by an aspiration event on 5/4 leading to acute hypoxic respiratory failure and acidosis with pH to 6.9.  Trialed BiPAP however patient was unable to tolerate due to continued  aspiration.  Importantly, patient was DNI and confirmed these wishes with the patient's daughter.  Extensive discussion with patient's daughter on 5/4 - 5/5 and elected to pursue comfort focused care for the patient given poor prognosis and limited options for treating acute hypoxic respiratory failure.    Septic Shock   Patient initially presented meeting SIRS criteria with hypothermia, tachycardia.  His UA on admission was notable for large leukocyte esterase, 162 WBCs, many bacteria.  His urine was also noted to be very dark.  He received sepsis dose fluids however his blood pressure remained low and he required pressors to maintain normotension.  He was ultimately started on vancomycin and cefepime.  Norepinephrine was weaned down after patient pursued comfort care.    AKI on CKD  Patient follows with Dr. Zeitler at [facility] nephrology.  Unclear etiology of CKD.  Suspicion of combination of factors including longstanding history of hypertension, dilated colon increasing intra-abdominal pressure, and multiple AKI's in the past leading to unilateral kidney atrophy.  His creatinine on admission was 4.21 from a baseline of 2-3.  Creatinine steadily improved to 2.81.  Suspect AKI was prerenal.    Otitis Externa  L ear drainage noted on exam, culture sent. No symptoms per patient. Aerobic/anaerobic culture growing 3+ GNRs and 2+ GPCs. Started on Ciprodex drops.     Chronic Colonic Dilatation   Follows with [facility]. CXR from 5/3 PM concerning for extraluminal air and demonstrated significant colonic dilatation. Obtained CT A/P which demonstrated slight interval increase in gaseous dilation of the colon which Adam Hoover be seen in the setting of adynamic ileus. Chronic finding for patient. Per last outpatient GI note 08/2023, continued with bowel regimen.     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 Merilynn Stapler with [facility]. Started on ADT in 09/2020. Appears disease was relatively stable but PSA Adam Hoover in 07/2023. Management has been complicated by worsening CKD of unknown etiology. Imaging on admission showed innumerable osteolytic lesions throughout both the axial and appendicular skeletons. CT C-Spine also showed a partially visualized destructive lesion involving the T3 vertebral body with adjacent soft tissue component and likely some degree of epidural extension. Patient unable to lie flat for MRI 2/2 secretions. His Daroutamide 300 mg BID was continued throughout his admission.

## 2023-09-30 NOTE — Unmapped (Signed)
 CVAD Liaison - Insertion Note      The CVAD Liaison was contacted for the insertion of Central Venous Access Device (CVAD).  A chart review performed.   Indication: Medications requiring central line    Prior to the start of the procedure, a time out was performed and the identity of the patient was confirmed via name, medical record number and date of birth.  The Central Line Checklist was referenced.  The sterile field was prepared with necessary supplies and equipment verified.  Insertion site was prepped with chlorhexidine and allowed to dry.  Maximum sterile techniques was utilized.    CVAD was inserted by Blaschke MD.  Catheter was aspirated and flushed.  After line was placed and secured by provider, the insertion site cleansed and sterile dressing applied per manufacturer guidelines by CVAD Liaison.     CVAD Liaison was present during entire procedure.  Report of the procedure given to the Primary Nurse.      Thank you for this consult,  Manuela Sella, RN, CVAD Liaison     Consult Time 120 minutes

## 2023-09-30 NOTE — Unmapped (Signed)
 Internal Medicine (MEDL) Progress Note    Assessment & Plan:   Adam Hoover is a 78 y.o. male whose presentation is complicated by metastatic prostate CA, CKD 3b, HTN, on PE, pAF, and lymphedema that presented to [facility] s/p fall with c/f urosepsis.     Principal Problem:    Sepsis with acute organ dysfunction  Active Problems:    History of pulmonary embolism, DVT/PE 07/17/15    Hypertension    Stage 3b chronic kidney disease (CMS-HCC)    Cancer related pain    Prostate cancer metastatic to bone    Venous stasis dermatitis of both lower extremities    AKI (acute kidney injury)    Chronic intestinal pseudo-obstruction    Fall    Renal tubular acidosis    Lymphedema    Atrial fibrillation    Physical deconditioning    Acute cystitis with hematuria    Significant Event:  RRT called at ~1258 2/2 c/f inability to protect airway given ongoing secretions.  Patient noted to have continued low BP with BP of 79/41 noted.  Labs include CBC, CMP, crit care VBG. CXR ordered. EKG with sinus tachycardia. Did not send blood cultures as they were obtained 5/2.  CBC without leukocytosis, stable Hgb.  CMP notable for mild hyperkalemia, continued AKI.  Lactate unremarkable at 0.6.  VBG with pH 7.32, pCO2 33, pO2 75.  Patient already receiving 1L LR so did not give additional liter.  S/p 4.75L IVF this admission.  Plan for A-line placement and transfer to MICU given pressor requirement. Broadened abx regimen to Vancomycin+Cefepime+Flagyl.    Active Problems    C/f septic shock 2/2 urinary tract infection  Initial presentation meeting SIRS criteria with hypothermia, tachycardia. UA on admission with large LE, 162 WBC, many bacteria.  No urinary symptoms noted by patient.  Received 3.8L IVF since presentation to ED with minimal responsiveness with regards to BP.  Started on vancomycin/aztreonam in ED.  Patient A&O x 4 but with persistent secretions. POCUS notable for extremely collapsible IVC.  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 Merilynn Stapler with [facility]. Started on ADT in 09/2020. Appears disease was relatively stable but PSA rose in 07/2023. Management has been complicated by worsening CKD of unknown etiology. Imaging on admission showed innumerable osteolytic lesions throughout both the axial and appendicular skeletons. CT C-Spine also showed a partially visualized destructive lesion involving the T3 vertebral body with adjacent soft tissue component and likely some degree of epidural extension. Patient unable to lie flat for MRI 2/2 secretions  - Primary onc notified of admission  - Continue Daroutamide reduced to 300 mg BID  - Eligard due on 11/07/2023  - Consider MRI T-spine once secretion management improved     AKI on CKD 3b- NAGMA  Follows with Dr. Cloria Danger with [facility], who notes no single etiology as cause of disease.  No hydronephrosis to suggest obstruction and patient previously does not respond to fluid resuscitation.  Suspicion of combination of factors, including longstanding history of HTN, dilated colon increasing intra-abdominal pressure, and multiple AKI's in the past leading to unilateral kidney atrophy.  Creatinine on admission 4.21 from a baseline of 2-3. Suspect this has a pre-renal component. Urine sodium 82, UPCR 0.833, uOsm 391.  - S/p IVF as above with minimal response,  which is similar to prior  - Consult nephrology, as patient has been unresponsive to fluids. Mentation s/p RRT is worse than prior, and given increasing uremia, may require CRRT.     Goals of Care - Deconditioning - Fall - Chronic Lymphedema   Related to age, lower extremity wounds, metastatic prostate cancer, pain. Admission today related to patient walking to go to the restroom yesterday afternoon when he felt his legs give out from under him. He denied any feelings of palpitations or lightheadedness prior to the fall. He denied any head strike or loss of consciousness. Reports he was just unable to get up. Currently lives alone. Daughter says he is independent in all his ADLs. Appears he was previously recommended for SNF but declined.  - PT/OT     Atrial Fibrillation - History of PE  Patient on Carvedilol and Eliquis at home.   - hold in setting of acute illness      Hypertension  - hold home Carvedilol iso acute illness     Chronic Colonic Dilitation   Follows with [facility].   - Stable  - continue bowel regimen     The patient's presentation is complicated by the following clinically significant conditions requiring additional evaluation and treatment: - Altered mental status secondary to - Sepsis  - Chronic kidney disease POA requiring further investigation, treatment, or monitoring   - Age related debility POA requiring additional resources: DME, PT, or OT  - Complex social situation/SDOH requiring additional support and resources: - Poor support as a result of problems related to living alone and/or to social environment   - Metastatic cancer POA requiring further investigation, treatment, or monitoring      Issues Impacting Complexity of Management:  -Intensive monitoring of drug toxicity from Vancomycin with scheduled BMP and/or Vancomycin levels and Cefepime with scheduled BMP    Daily Checklist:  Diet: NPO  DVT PPx: On Eliquis, holding at present  Electrolytes: Magnesium Repleted  Code Status: DNR and DNI  Dispo: Transfer to MICU    Team Contact Information:   Primary Team: Internal Medicine (MEDL)  Primary Resident: Lorice Roof, MD, MD  Resident's Pager: [phone] (Gen MedL Intern - Tower)    Interval History:     Patient seen this morning with Bair hugger. Was A&O x4 and answering all questions appropriately. On transfer to [facility], patient noted to have worsening secretions and RRT called with notes as above.    Objective:   Temp:  [34.1 ??C (93.4 ??F)-36.9 ??C (98.4 ??F)] 36.6 ??C (97.9 ??F)  Pulse:  [73-114] 94  SpO2 Pulse:  [74-114] 100  Resp:  [12-30] 22  BP: (58-112)/(35-73) 105/54  SpO2:  [91 %-100 %] 100 %    Gen: Acutely ill-appearing  HENT: atraumatic, normocephalic  Heart: Sinus tachycardia  Lungs: Crackles appreciated bilaterally   Abdomen: Moderately distended, nontender to palpation  Extremities: Significant venous stasis with 3-4+ BLE edema

## 2023-10-01 LAB — CBC W/ AUTO DIFF
BASOPHILS ABSOLUTE COUNT: 0.1 10*9/L (ref 0.0–0.1)
BASOPHILS RELATIVE PERCENT: 0.7 %
EOSINOPHILS ABSOLUTE COUNT: 0.1 10*9/L (ref 0.0–0.5)
EOSINOPHILS RELATIVE PERCENT: 1.3 %
HEMATOCRIT: 30.7 % — ABNORMAL LOW (ref 39.0–48.0)
HEMOGLOBIN: 9.8 g/dL — ABNORMAL LOW (ref 12.9–16.5)
LYMPHOCYTES ABSOLUTE COUNT: 1 10*9/L — ABNORMAL LOW (ref 1.1–3.6)
LYMPHOCYTES RELATIVE PERCENT: 13.7 %
MEAN CORPUSCULAR HEMOGLOBIN CONC: 32 g/dL (ref 32.0–36.0)
MEAN CORPUSCULAR HEMOGLOBIN: 28 pg (ref 25.9–32.4)
MEAN CORPUSCULAR VOLUME: 87.3 fL (ref 77.6–95.7)
MEAN PLATELET VOLUME: 7.9 fL (ref 6.8–10.7)
MONOCYTES ABSOLUTE COUNT: 0.9 10*9/L — ABNORMAL HIGH (ref 0.3–0.8)
MONOCYTES RELATIVE PERCENT: 11.5 %
NEUTROPHILS ABSOLUTE COUNT: 5.4 10*9/L (ref 1.8–7.8)
NEUTROPHILS RELATIVE PERCENT: 72.8 %
PLATELET COUNT: 509 10*9/L — ABNORMAL HIGH (ref 150–450)
RED BLOOD CELL COUNT: 3.51 10*12/L — ABNORMAL LOW (ref 4.26–5.60)
RED CELL DISTRIBUTION WIDTH: 15.9 % — ABNORMAL HIGH (ref 12.2–15.2)
WBC ADJUSTED: 7.4 10*9/L (ref 3.6–11.2)

## 2023-10-01 LAB — HIGH SENSITIVITY TROPONIN I - SINGLE
HIGH SENSITIVITY TROPONIN I: 17 ng/L (ref <=53)
HIGH SENSITIVITY TROPONIN I: 15 ng/L (ref <=53)

## 2023-10-01 LAB — BLOOD GAS CRITICAL CARE PANEL, ARTERIAL
BASE EXCESS ARTERIAL: -9.2 — ABNORMAL LOW (ref -2.0–2.0)
BASE EXCESS ARTERIAL: -7.5 — ABNORMAL LOW (ref -2.0–2.0)
CALCIUM IONIZED ARTERIAL (MG/DL): 5.43 mg/dL — ABNORMAL HIGH (ref 4.40–5.40)
CALCIUM IONIZED ARTERIAL (MG/DL): 5.6 mg/dL — ABNORMAL HIGH (ref 4.40–5.40)
CALCIUM IONIZED ARTERIAL (MG/DL): 5.57 mg/dL — ABNORMAL HIGH (ref 4.40–5.40)
GLUCOSE WHOLE BLOOD: 120 mg/dL (ref 70–179)
GLUCOSE WHOLE BLOOD: 124 mg/dL (ref 70–179)
GLUCOSE WHOLE BLOOD: 122 mg/dL (ref 70–179)
HCO3 ARTERIAL: 17 mmol/L — ABNORMAL LOW (ref 22–27)
HCO3 ARTERIAL: 20 mmol/L — ABNORMAL LOW (ref 22–27)
HEMOGLOBIN BLOOD GAS: 9.5 g/dL — ABNORMAL LOW (ref 13.50–17.50)
HEMOGLOBIN BLOOD GAS: 10.8 g/dL — ABNORMAL LOW (ref 13.50–17.50)
HEMOGLOBIN BLOOD GAS: 12.4 g/dL — ABNORMAL LOW (ref 13.50–17.50)
LACTATE BLOOD ARTERIAL: 0.5 mmol/L (ref <1.3)
LACTATE BLOOD ARTERIAL: 0.5 mmol/L (ref <1.3)
LACTATE BLOOD ARTERIAL: 0.4 mmol/L (ref <1.3)
O2 SATURATION ARTERIAL: 98.9 % (ref 94.0–100.0)
O2 SATURATION ARTERIAL: 99.2 % (ref 94.0–100.0)
O2 SATURATION ARTERIAL: 98.7 % (ref 94.0–100.0)
PCO2 ARTERIAL: 43.3 mmHg (ref 35.0–45.0)
PCO2 ARTERIAL: 55.8 mmHg — ABNORMAL HIGH (ref 35.0–45.0)
PCO2 ARTERIAL: >100 mmHg (ref 35.0–45.0)
PH ARTERIAL: 7.22 — ABNORMAL LOW (ref 7.35–7.45)
PH ARTERIAL: 7.17 — CL (ref 7.35–7.45)
PH ARTERIAL: 6.94 — CL (ref 7.35–7.45)
PO2 ARTERIAL: 163 mmHg — ABNORMAL HIGH (ref 80.0–110.0)
PO2 ARTERIAL: 233 mmHg — ABNORMAL HIGH (ref 80.0–110.0)
PO2 ARTERIAL: 208 mmHg — ABNORMAL HIGH (ref 80.0–110.0)
POTASSIUM WHOLE BLOOD: 4.8 mmol/L — ABNORMAL HIGH (ref 3.4–4.6)
POTASSIUM WHOLE BLOOD: 4.9 mmol/L — ABNORMAL HIGH (ref 3.4–4.6)
POTASSIUM WHOLE BLOOD: 5.2 mmol/L — ABNORMAL HIGH (ref 3.4–4.6)
SODIUM WHOLE BLOOD: 140 mmol/L (ref 135–145)
SODIUM WHOLE BLOOD: 140 mmol/L (ref 135–145)
SODIUM WHOLE BLOOD: 142 mmol/L (ref 135–145)

## 2023-10-01 LAB — COMPREHENSIVE METABOLIC PANEL
ALBUMIN: 1.8 g/dL — ABNORMAL LOW (ref 3.4–5.0)
ALBUMIN: 2.1 g/dL — ABNORMAL LOW (ref 3.4–5.0)
ALKALINE PHOSPHATASE: 101 U/L (ref 46–116)
ALKALINE PHOSPHATASE: 117 U/L — ABNORMAL HIGH (ref 46–116)
ALT (SGPT): 16 U/L (ref 10–49)
ALT (SGPT): 22 U/L (ref 10–49)
ANION GAP: 10 mmol/L (ref 5–14)
ANION GAP: 7 mmol/L (ref 5–14)
AST (SGOT): 39 U/L — ABNORMAL HIGH (ref <=34)
AST (SGOT): 40 U/L — ABNORMAL HIGH (ref <=34)
BILIRUBIN TOTAL: <0.2 mg/dL — ABNORMAL LOW (ref 0.3–1.2)
BILIRUBIN TOTAL: <0.2 mg/dL — ABNORMAL LOW (ref 0.3–1.2)
BLOOD UREA NITROGEN: 59 mg/dL — ABNORMAL HIGH (ref 9–23)
BLOOD UREA NITROGEN: 57 mg/dL — ABNORMAL HIGH (ref 9–23)
BUN / CREAT RATIO: 19
BUN / CREAT RATIO: 20
CALCIUM: 8.8 mg/dL (ref 8.7–10.4)
CALCIUM: 9.8 mg/dL (ref 8.7–10.4)
CHLORIDE: 112 mmol/L — ABNORMAL HIGH (ref 98–107)
CHLORIDE: 112 mmol/L — ABNORMAL HIGH (ref 98–107)
CO2: 19 mmol/L — ABNORMAL LOW (ref 20.0–31.0)
CO2: 24 mmol/L (ref 20.0–31.0)
CREATININE: 3.16 mg/dL — ABNORMAL HIGH (ref 0.73–1.18)
CREATININE: 2.81 mg/dL — ABNORMAL HIGH (ref 0.73–1.18)
EGFR CKD-EPI (2021) MALE: 20 mL/min/1.73m2 — ABNORMAL LOW (ref >=60)
EGFR CKD-EPI (2021) MALE: 23 mL/min/1.73m2 — ABNORMAL LOW (ref >=60)
GLUCOSE RANDOM: 121 mg/dL (ref 70–179)
GLUCOSE RANDOM: 130 mg/dL (ref 70–179)
POTASSIUM: 4.9 mmol/L — ABNORMAL HIGH (ref 3.4–4.8)
POTASSIUM: 5.5 mmol/L — ABNORMAL HIGH (ref 3.4–4.8)
PROTEIN TOTAL: 6.8 g/dL (ref 5.7–8.2)
PROTEIN TOTAL: 7.5 g/dL (ref 5.7–8.2)
SODIUM: 141 mmol/L (ref 135–145)
SODIUM: 143 mmol/L (ref 135–145)

## 2023-10-01 LAB — VANCOMYCIN, RANDOM: VANCOMYCIN RANDOM: 25.7 ug/mL

## 2023-10-01 LAB — CBC
HEMATOCRIT: 37 % — ABNORMAL LOW (ref 39.0–48.0)
HEMOGLOBIN: 11.3 g/dL — ABNORMAL LOW (ref 12.9–16.5)
MEAN CORPUSCULAR HEMOGLOBIN CONC: 30.7 g/dL — ABNORMAL LOW (ref 32.0–36.0)
MEAN CORPUSCULAR HEMOGLOBIN: 27.7 pg (ref 25.9–32.4)
MEAN CORPUSCULAR VOLUME: 90.4 fL (ref 77.6–95.7)
MEAN PLATELET VOLUME: 7.1 fL (ref 6.8–10.7)
PLATELET COUNT: 559 10*9/L — ABNORMAL HIGH (ref 150–450)
RED BLOOD CELL COUNT: 4.09 10*12/L — ABNORMAL LOW (ref 4.26–5.60)
RED CELL DISTRIBUTION WIDTH: 16.5 % — ABNORMAL HIGH (ref 12.2–15.2)
WBC ADJUSTED: 9.9 10*9/L (ref 3.6–11.2)

## 2023-10-01 LAB — PHOSPHORUS: PHOSPHORUS: 4.1 mg/dL (ref 2.4–5.1)

## 2023-10-01 LAB — MAGNESIUM: MAGNESIUM: 2.1 mg/dL (ref 1.6–2.6)

## 2023-10-01 LAB — PRO-BNP: PRO-BNP: 2392 pg/mL — ABNORMAL HIGH (ref <=300.0)

## 2023-10-01 LAB — PROTIME-INR
INR: 1.53
PROTIME: 17.4 s — ABNORMAL HIGH (ref 9.9–12.6)

## 2023-10-01 MED ADMIN — phenol (CHLORASEPTIC) 1.4 % spray 2 spray: 2 | @ 08:00:00

## 2023-10-01 MED ADMIN — oxyCODONE (ROXICODONE) immediate release tablet 2.5 mg: 2.5 mg | ORAL | @ 19:00:00 | Stop: 2023-10-01

## 2023-10-01 MED ADMIN — prednisoLONE acetate (PRED FORTE) 1 % ophthalmic suspension 4 drop: 4 [drp] | OTIC | @ 15:00:00 | Stop: 2023-10-01

## 2023-10-01 MED ADMIN — darolutamide (NUBEQA) tablet 300 mg **PATIENT SUPPLIED**: 300 mg | ORAL | @ 14:00:00 | Stop: 2023-10-01

## 2023-10-01 MED ADMIN — darolutamide (NUBEQA) tablet 300 mg **PATIENT SUPPLIED**: 300 mg | ORAL | @ 01:00:00

## 2023-10-01 MED ADMIN — oxyCODONE (ROXICODONE) immediate release tablet 2.5 mg: 2.5 mg | ORAL | @ 02:00:00 | Stop: 2023-10-07

## 2023-10-01 MED ADMIN — ciprofloxacin HCl (CILOXAN) 0.3 % ophthalmic solution 4 drop: 4 [drp] | OTIC | @ 15:00:00 | Stop: 2023-10-01

## 2023-10-01 MED ADMIN — cefepime (MAXIPIME) 1 g in sodium chloride 0.9 % (NS) 100 mL IVPB-MBP: 1 g | INTRAVENOUS | @ 18:00:00 | Stop: 2023-10-01

## 2023-10-01 MED ADMIN — magnesium sulfate 2gm/50mL IVPB: 2 g | INTRAVENOUS | @ 01:00:00 | Stop: 2023-09-30

## 2023-10-01 MED ADMIN — guaiFENesin (ROBITUSSIN) oral syrup: 200 mg | ORAL | @ 10:00:00 | Stop: 2023-10-01

## 2023-10-01 MED ADMIN — NORepinephrine 8 mg in dextrose 5 % 250 mL (32 mcg/mL) infusion PMB: 0-30 ug/min | INTRAVENOUS | @ 14:00:00

## 2023-10-01 MED ADMIN — apixaban (ELIQUIS) tablet 5 mg: 5 mg | ORAL | @ 14:00:00 | Stop: 2023-10-01

## 2023-10-01 MED ADMIN — apixaban (ELIQUIS) tablet 5 mg: 5 mg | ORAL | @ 01:00:00

## 2023-10-01 MED ADMIN — vancomycin (VANCOCIN) 2000 mg IVPB in 500 mL sodium chloride 0.9% (premix): 2000 mg | INTRAVENOUS | @ 01:00:00 | Stop: 2023-09-30

## 2023-10-01 MED ADMIN — guaiFENesin (ROBITUSSIN) oral syrup: 200 mg | ORAL | @ 06:00:00 | Stop: 2023-10-01

## 2023-10-01 MED ADMIN — cefepime (MAXIPIME) 1 g in sodium chloride 0.9 % (NS) 100 mL IVPB-MBP: 1 g | INTRAVENOUS | @ 01:00:00 | Stop: 2023-10-07
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
## 2023-10-01 NOTE — Unmapped (Addendum)
 Patient alert and oriented x4, afebrile, SR to ST, on 3L [place] to maintain oxygen saturation within order parameter. Patient titrated on Levo to sustain MAP>65. L radial a-line very positional, unable to fix after multiple attempts from staff nurse and charge nurse, does not correlate with cuff BP, informed MD, ABG obtained to assess lactate, lactate 0.5, ok to go by cuff BP per MD order. Patient complained of R neck pain, given PRN oxycodone, pt verbalized adequate pain relief. Patient had intermittent cough and oral secretions, PRN guaifenesin and PRN chloraseptic spray given with adequate symptom relief, oral care provided, pt utilized bedside oral suction independently. Foley in place, adequate UOP. Patient unable to proceed with MRI due to unable to lay flat for duration of MRI due to persistent coughs/oral secretions, MD informed.     Problem: Adult Inpatient Plan of Care  Goal: Plan of Care Review  Outcome: Ongoing - Unchanged  Goal: Patient-Specific Goal (Individualized)  Outcome: Ongoing - Unchanged  Goal: Absence of Hospital-Acquired Illness or Injury  Outcome: Ongoing - Unchanged  Intervention: Identify and Manage Fall Risk  Recent Flowsheet Documentation  Taken 09/30/2023 2000 by Melanni Benway, RN  Safety Interventions:   aspiration precautions   bed alarm   bleeding precautions   fall reduction program maintained   infection management   lighting adjusted for tasks/safety   low bed   commode/urinal/bedpan at bedside   environmental modification   nonskid shoes/slippers when out of bed  Intervention: Prevent Skin Injury  Recent Flowsheet Documentation  Taken 10/01/2023 0200 by Porchea Charrier, RN  Positioning for Skin: Left  Taken 10/01/2023 0000 by Arlyne Bering, RN  Positioning for Skin: Right  Taken 09/30/2023 2200 by Arlyne Bering, RN  Positioning for Skin: Left  Taken 09/30/2023 2000 by Arlyne Bering, RN  Positioning for Skin: Right  Device Skin Pressure Protection:   absorbent pad utilized/changed   adhesive use limited   tubing/devices free from skin contact   positioning supports utilized   pressure points protected  Skin Protection:   adhesive use limited   incontinence pads utilized   silicone foam dressing in place   tubing/devices free from skin contact   cleansing with dimethicone incontinence wipes   pulse oximeter probe site changed   transparent dressing maintained   skin sealant/moisture barrier applied  Intervention: Prevent Infection  Recent Flowsheet Documentation  Taken 09/30/2023 2000 by Bridgett Hattabaugh, RN  Infection Prevention:   cohorting utilized   environmental surveillance performed   equipment surfaces disinfected   hand hygiene promoted   personal protective equipment utilized   rest/sleep promoted   single patient room provided  Goal: Optimal Comfort and Wellbeing  Outcome: Ongoing - Unchanged  Goal: Readiness for Transition of Care  Outcome: Ongoing - Unchanged  Goal: Rounds/Family Conference  Outcome: Ongoing - Unchanged     Problem: Fall Injury Risk  Goal: Absence of Fall and Fall-Related Injury  Outcome: Ongoing - Unchanged  Intervention: Promote Injury-Free Environment  Recent Flowsheet Documentation  Taken 09/30/2023 2000 by Catcher Dehoyos, RN  Safety Interventions:   aspiration precautions   bed alarm   bleeding precautions   fall reduction program maintained   infection management   lighting adjusted for tasks/safety   low bed   commode/urinal/bedpan at bedside   environmental modification   nonskid shoes/slippers when out of bed     Problem: Wound  Goal: Optimal Coping  Outcome: Ongoing - Unchanged  Goal: Optimal Functional Ability  Outcome: Ongoing - Unchanged  Intervention: Optimize  Functional Ability  Recent Flowsheet Documentation  Taken 10/01/2023 0200 by Sojourner Behringer, RN  Activity Management: bedrest  Taken 10/01/2023 0000 by Uziah Sorter, RN  Activity Management: bedrest  Taken 09/30/2023 2200 by Gildo Crisco, RN  Activity Management: bedrest  Taken 09/30/2023 2000 by Kaushik Maul, RN  Activity Management: bedrest  Goal: Absence of Infection Signs and Symptoms  Outcome: Ongoing - Unchanged  Intervention: Prevent or Manage Infection  Recent Flowsheet Documentation  Taken 09/30/2023 2000 by Keion Neels, RN  Infection Management: aseptic technique maintained  Goal: Improved Oral Intake  Outcome: Ongoing - Unchanged  Goal: Optimal Pain Control and Function  Outcome: Ongoing - Unchanged  Intervention: Prevent or Manage Pain  Recent Flowsheet Documentation  Taken 09/30/2023 2000 by Deaysia Grigoryan, RN  Sleep/Rest Enhancement:   consistent schedule promoted   regular sleep/rest pattern promoted   relaxation techniques promoted   room darkened   therapeutic touch utilized  Goal: Skin Health and Integrity  Outcome: Ongoing - Unchanged  Intervention: Optimize Skin Protection  Recent Flowsheet Documentation  Taken 10/01/2023 0200 by Dorris Vangorder, RN  Activity Management: bedrest  Head of Bed [facility]) Positioning: HOB at 45 degrees  Taken 10/01/2023 0000 by Arlyne Bering, RN  Activity Management: bedrest  Head of Bed [facility]) Positioning: HOB at 45 degrees  Taken 09/30/2023 2200 by Jakara Blatter, RN  Activity Management: bedrest  Head of Bed [facility]) Positioning: HOB at 60 degrees  Taken 09/30/2023 2000 by Derita Michelsen, RN  Activity Management: bedrest  Pressure Reduction Techniques:   frequent weight shift encouraged   heels elevated off bed   weight shift assistance provided   pressure points protected   positioned off wounds  Head of Bed (HOB) Positioning: HOB at 60 degrees  Pressure Reduction Devices:   pressure-redistributing mattress utilized   specialty bed utilized   foam padding utilized   heel offloading device utilized   positioning supports utilized  Skin Protection:   adhesive use limited   incontinence pads utilized   silicone foam dressing in place   tubing/devices free from skin contact   cleansing with dimethicone incontinence wipes   pulse oximeter probe site changed   transparent dressing maintained   skin sealant/moisture barrier applied  Goal: Optimal Wound Healing  Outcome: Ongoing - Unchanged  Intervention: Promote Wound Healing  Recent Flowsheet Documentation  Taken 09/30/2023 2000 by Marylene Masek, RN  Sleep/Rest Enhancement:   consistent schedule promoted   regular sleep/rest pattern promoted   relaxation techniques promoted   room darkened   therapeutic touch utilized     Problem: Skin Injury Risk Increased  Goal: Skin Health and Integrity  Outcome: Ongoing - Unchanged  Intervention: Optimize Skin Protection  Recent Flowsheet Documentation  Taken 10/01/2023 0200 by Arcadio Cope, RN  Activity Management: bedrest  Head of Bed [facility]) Positioning: HOB at 45 degrees  Taken 10/01/2023 0000 by Kisa Fujii, RN  Activity Management: bedrest  Head of Bed [facility]) Positioning: HOB at 45 degrees  Taken 09/30/2023 2200 by Carleen Rhue, RN  Activity Management: bedrest  Head of Bed [facility]) Positioning: HOB at 60 degrees  Taken 09/30/2023 2000 by Ellsworth Waldschmidt, RN  Activity Management: bedrest  Pressure Reduction Techniques:   frequent weight shift encouraged   heels elevated off bed   weight shift assistance provided   pressure points protected   positioned off wounds  Head of Bed (HOB) Positioning: HOB at 60 degrees  Pressure Reduction Devices:   pressure-redistributing mattress utilized   specialty bed utilized  foam padding utilized   heel offloading device utilized   positioning supports utilized  Skin Protection:   adhesive use limited   incontinence pads utilized   silicone foam dressing in place   tubing/devices free from skin contact   cleansing with dimethicone incontinence wipes   pulse oximeter probe site changed   transparent dressing maintained   skin sealant/moisture barrier applied     Problem: Self-Care Deficit  Goal: Improved Ability to Complete Activities of Daily Living  Outcome: Ongoing - Unchanged

## 2023-10-01 NOTE — Unmapped (Signed)
 ICU TRANSPORT NOTE    Destination: CT    Departing Unit: MICU  Pickup Time: 1250    Return Unit: MICU  Return Time: 1330    [place] patient ID band verified  Allergies Reviewed  Code Status at time of transport: DNR/DNI    Report received from primary nurse via [employer]. Handoff performed of continuous drip/infusion Patient transported via stretcher under ICU level of care. See vital signs during transport via [employer]. O2 via Non-rebreather @ 100%. Patient is following commands. Patient tolerated procedure/scan poorly due to airway / secretions. Universal precautions maintained throughout transport.    Update and care given to primary nurse. See Doc Flowsheets/MAR for additional transportation documentation. Proper body mechanics and safe patient handling equipment were utilized throughout transport.

## 2023-10-01 NOTE — Unmapped (Signed)
 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 male with PMH metastatic prostate CA, CKD 3b, HTN, on PE, pAF, and lymphedema that presented to [facility] s/p fall with c/f urosepsis.      RRT called at ~1258 2/2 c/f inability to protect airway given ongoing secretions.  Patient noted to have continued low BP with BP of 79/41 noted.  Labs include CBC, CMP, crit care VBG. CXR ordered. EKG with sinus tachycardia. Did not send blood cultures as they were obtained 5/2.  CBC without leukocytosis, stable Hgb.  CMP notable for mild hyperkalemia, continued AKI.  Lactate unremarkable at 0.6.  VBG with pH 7.32, pCO2 33, pO2 75.  Patient already receiving 1L LR so did not give additional liter.  S/p 4.75L IVF this admission. Broadened abx regimen to Vancomycin+Cefepime+Flagyl. A-line placed and transferred to MICU given pressor requirement.    24 hour events  - Norepi up to 11 this AM    Neurological   C/f AMS  Concern for increased somnolence during rapid this PM, patient A&Ox3 on exam in MICU.  - CTM    Analgesia: Pain adequately controlled  RASS at goal? N/A, not on sedation  [place] Agitation Assessment Scale (RASS) : -1 (10/01/2023  4:00 AM)       Pulmonary   Cough - Acute Hypoxia  Productive cough on exam. Originally on RA, now on 3L [place] after desaturation - unclear of O2 sat at the time. CXR 5/2 PM without concern for PNA. CT A/P from today demonstrated consolidation in RML. Coarse breath sounds on exam. Quad screen negative. Colonic dilatation possibly contributing to his subjective dyspnea, obtaining CT A/P as below.  - Continue IV Vanc/cefepime  - Can consider full RPP  - follow up lower respiratory culture (mixed oral flora)  - Wean O2 as tolerated     O2 Flow Rate (L/min):  [3 L/min-4 L/min] 3 L/min    Cardiovascular   C/f septic shock 2/2 urinary tract infection  Initial presentation meeting SIRS criteria with hypothermia, tachycardia. UA on admission with large LE, 162 WBC, many bacteria.  No urinary symptoms noted by patient. Urine very dark on exam, improving. Received 4.75L IVF since presentation to ED with minimal responsiveness with regards to BP.  Started on vancomycin/aztreonam in ED.  Patient A&O x 4 but with persistent secretions. PNA could be contributing with new RML consolidation present on CT. POCUS in ED notable for extremely collapsible IVC. Started on norepi and transferred to MICU. Weaned to 4 of norepi today.   - Continue Vanc/Cefepime  - Follow-up Bcx (NGTD), Ucx (prelim 50-100,000 CFU E. Coli)  - Wean norepi gtt as tolerated     Atrial Fibrillation - History of PE  Patient on Carvedilol and Eliquis at home.   -  Held home Eliquis due to concern for extraluminal air on CXR from last night.     Hypertension  - hold home Carvedilol iso acute illness    Renal   AKI on CKD 3b- NAGMA  Follows with Dr. Zeitler with [facility], who notes no single etiology as cause of disease.  No hydronephrosis to suggest obstruction and patient previously does not respond to fluid resuscitation.  Suspicion of combination of factors, including longstanding history of HTN, dilated colon increasing intra-abdominal pressure, and multiple AKI's in the past leading to unilateral kidney atrophy.  Creatinine on admission 4.21 from a baseline of 2-3. Improved to 3.57 with fluid resuscitation, continuing to improve to 3.16 this AM. Suspect this has a pre-renal component. Urine sodium 82, UPCR 0.833, uOsm 391.  - Daily BMP, Mag, Phos  - Monitor for further fluid resuscitation     Infectious Disease/Autoimmune   C/f septic shock 2/2 urinary tract infection  Initial presentation meeting SIRS criteria with hypothermia, tachycardia. UA on admission with large LE, 162 WBC, many bacteria.  No urinary symptoms noted by patient.  Received 4.75L IVF since presentation to ED with minimal responsiveness with regards to BP.  Started on vancomycin/aztreonam in ED.  Patient A&O x 4 but with persistent secretions. POCUS notable for extremely collapsible IVC. Started on norepi. Likely significant fluid depletion given patient reported low PO intake of water in the last few days. New RML consolidation noted on CT A/P. Will discuss narrowing abx.   - Continue IV Vancomycin and Cefepime  - Follow urine culture (prelim 50-100,000 CFU E. Coli), blood cultures (NGTD)     Otitis externa  L ear drainage noted on exam, culture sent. No symptoms per patient. Aerobic/anaerobic culture growing 3+ GNRs and 2+ GPCs.   - Start ciprodex drops  - Consider ENT consult if no improvement    Cultures:  Blood Culture, Routine (no units)   Date Value   09/29/2023 No Growth at 24 hours   09/29/2023 No Growth at 24 hours     WBC (10*9/L)   Date Value   10/01/2023 7.4     WBC, UA (/HPF)   Date Value   09/29/2023 162 (H)        FEN/GI   Chronic Colonic Dilatation   Follows with [facility]. CXR from 5/3 PM concerning for extraluminal air and demonstrated significant colonic dilatation. Obtained CT A/P which demonstrated slight interval increase in gaseous dilation of the colon which may be seen in the setting of adynamic ileus. Chronic finding for patient. Per last outpatient GI note 08/2023, will continue with bowel regimen and electrolytes.  - Stable  - continue bowel regimen, optimizing electrolytes    Provider Malnutrition Assessment:  Body mass index is 34.01 kg/m??.BMI Interpretation: >/= 30 and < 40, consistent with obesity, clinically significant requiring additional resources and complicating multiple aspects of patient care.  GLIM criteria:   Pt does not meet criteria  -I have screened this patient for malnutrition and they did NOT meet criteria for malnutrition based on GLIM criteria.  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 Merilynn Stapler with [facility]. Started on ADT in 09/2020. Appears disease was relatively stable but PSA rose in 07/2023. Management has been complicated by worsening CKD of unknown etiology. Imaging on admission showed innumerable osteolytic lesions throughout both the axial and appendicular skeletons. CT C-Spine also showed a partially visualized destructive lesion involving the  T3 vertebral body with adjacent soft tissue component and likely some degree of epidural extension. Patient unable to lie flat for MRI 2/2 secretions  - Primary onc notified of admission  - Continue Daroutamide reduced to 300 mg BID (daughter bringing as not on formulary)  - Eligard due on 11/07/2023  - Consider MRI T-spine once secretion management improved     Anemia  Hgb 9.8 this PM, baseline appears to be 9-10. Suspect chronic disease as etiology.   - Daily CBC    Endocrine   NAI    Integumentary   Chronic Lymphedema   Related to age, lower extremity wounds, metastatic prostate cancer, pain. Admission today related to patient walking to go to the restroom yesterday afternoon when he felt his legs give out from under him. He denied any feelings of palpitations or lightheadedness prior to the fall. He denied any head strike or loss of consciousness. Reports he was just unable to get up. Currently lives alone. Daughter says he is independent in all his ADLs. Appears he was previously recommended for SNF but declined.  - Oxycodone 2.5 mg q4h PRN for pain (allergy to Tylenol)  - Wound consult  - PT/OT    #  - WOCN consulted for high risk skin assessment Yes.  - WOCN recs >> pending  - cont pressure mitigating precautions per skin policy    Prophylaxis/LDA/Restraints/Consults   ICU Checklist completed: yes (see ICU rounding navigator in Epic)    Patient Lines/Drains/Airways Status       Active Active Lines, Drains, & Airways       Name Placement date Placement time Site Days    CVC Triple Lumen 09/30/23 Non-tunneled Right Internal jugular 09/30/23  1718  Internal jugular  less than 1    Urethral Catheter Coude 16 Fr. 09/29/23  2004  Coude  1    Peripheral IV 09/29/23 Anterior;Distal;Right Hand 09/29/23  1359  Hand  1    Arterial Line 09/30/23 Left Radial 09/30/23  1900  Radial  less than 1                  Patient Lines/Drains/Airways Status       Active Wounds       Name Placement date Placement time Site Days    Wound 09/30/23 Pretibial Distal;Left;Outer weeping lymphedema 09/30/23  1428  Pretibial  less than 1    Wound 09/30/23 Other (comment) Buttocks Left;Inner Friction 09/30/23  1529  Buttocks  less than 1                    Goals of Care     Code Status:   Orders Placed This Encounter   Procedures    DNR (Do Not Resuscitate) and DNI (Do Not Intubate)     Standing Status:   Standing     Number of Occurrences:   1        Librarian, academic:  Mr. Pike designated healthcare decision maker(s) is/are   HCDM (patient stated preference): [place], [place] - Daughter - [phone]. See HCDM section of Epic sidebar/storyboard or ACP tab in patient chart for details regarding active HCDMs and patient capacity for decision-making.      Subjective     States he feels better this AM, still has productive cough    Objective     Vitals - past 24 hours  Temp:  [36.3 ??C (97.3 ??F)-36.9 ??C (98.4 ??F)] 36.4 ??C (97.6 ??F)  Pulse:  [77-114] 82  SpO2 Pulse:  [77-114] 82  Resp:  [12-30] 15  BP: (64-132)/(35-112) 100/64  A BP-1: (88-206)/(52-181) 206/181  SpO2:  [93 %-100 %] 100 % Intake/Output  I/O last 3 completed shifts:  In: 4297.9 [P.O.:380; I.V.:67.9; IV Piggyback:3850]  Out: 575 [Urine:575]     Physical Exam:    Physical Exam:    General: chronically ill appearing, no acute distress  HEENT: dried purulent discharge from L ear, nasal cannula in place  CV: RRR, no murmurs appreciated  Pulm: coarse breath sounds bilaterally  GI: soft, normoactive bowel sounds  MSK: 3+ edema of bilateral lower extremities  Skin: chronic dry scaly skin overlying lower extremities, weeping  Neuro: A&Ox3      Continuous Infusions:    NORepinephrine bitartrate-NS 11 mcg/min (10/01/23 0330)       Scheduled Medications:    apixaban  5 mg Oral BID    [Provider Hold] carvedilol  25 mg Oral BID    cefepime  1 g Intravenous Q24H    darolutamide  300 mg Oral BID with meals    Vancomycin - Pharmacy dosing by levels   Other Pharmacy dosing       PRN medications:  aluminum-magnesium hydroxide-simethicone, diphenhydrAMINE, guaiFENesin, melatonin, ondansetron, oxyCODONE, phenol, senna    Data/Imaging Review: Reviewed in Epic and personally interpreted on 10/01/2023. See EMR for detailed results.    Caryl Clas, MD  [facility] PGY-1
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
## 2023-10-01 NOTE — Unmapped (Signed)
 Adam Hoover has been Alert, oriented to person, place and time and following commands. CAM ICU negative. SpO2 has been stable on 4.0 L [place], but patient frequently attesting to dyspnea both at rest (using NRB for brief supplemental breathing) and with exertion (laying flat, turning in bed for patient care).  Patient does not tolerate laying flat for longer than 2-3 minutes aeb dyspnea, increased anxiety, heart rate and BP.  Patient notable for copious oral and respiratory secretions, and struggles to mobilize respiratory secretions.  Coughing and tearing noted when patient swallowing fluids or pills, and Speech Language Pathologist consulted. Per SLP recommendations, patient will remain NPO for now.     Patient went to for CT abdomen pelvis via ICU transport.  NG tube placed for bowel decompression, and 500 mL brown liquid removed over 6 hours.  Patient had one large BM this shift (formed/pasty) and urine output 400 mL of brown cloudy liquid.     Patient afebrile, but VS notable for hypotension requiring vasopressor support and sinus tachycardia with HR in the low 100-110.  Pedal pulses detectable by doppler.    Patient received full bath with CHG treatment as per policy.  Patient turned q2hrs and heels offloaded to promote skin integrity.  Patient on PO elequis for DVT prophylaxis.      Problem: Adult Inpatient Plan of Care  Goal: Plan of Care Review  Outcome: Ongoing - Unchanged  Goal: Patient-Specific Goal (Individualized)  Outcome: Ongoing - Unchanged  Goal: Absence of Hospital-Acquired Illness or Injury  Outcome: Ongoing - Unchanged  Intervention: Identify and Manage Fall Risk  Recent Flowsheet Documentation  Taken 10/01/2023 0800 by Marland Silvas, RN  Safety Interventions:   aspiration precautions   bed alarm   commode/urinal/bedpan at bedside   enteral feeding safety   infection management   lighting adjusted for tasks/safety   low bed   muscle strengthening facilitated   nonskid shoes/slippers when out of bed  Intervention: Prevent Skin Injury  Recent Flowsheet Documentation  Taken 10/01/2023 1800 by Marland Silvas, RN  Positioning for Skin: Left  Taken 10/01/2023 1600 by Marland Silvas, RN  Positioning for Skin: Right  Taken 10/01/2023 1400 by Marland Silvas, RN  Positioning for Skin: Left  Taken 10/01/2023 1330 by Marland Silvas, RN  Positioning for Skin: Left  Taken 10/01/2023 1200 by Marland Silvas, RN  Positioning for Skin: Right  Taken 10/01/2023 1000 by Marland Silvas, RN  Positioning for Skin: Left  Taken 10/01/2023 0800 by Marland Silvas, RN  Positioning for Skin: Right  Skin Protection:   adhesive use limited   cleansing with dimethicone incontinence wipes   incontinence pads utilized   pulse oximeter probe site changed   silicone foam dressing in place   transparent dressing maintained   tubing/devices free from skin contact  Intervention: Prevent and Manage VTE (Venous Thromboembolism) Risk  Recent Flowsheet Documentation  Taken 10/01/2023 1800 by Marland Silvas, RN  Anti-Embolism Device Status: (PO anticaogulation - elequis) --  Taken 10/01/2023 1600 by Marland Silvas, RN  Anti-Embolism Device Status: (PO anticaogulation - elequis) --  Taken 10/01/2023 1400 by Marland Silvas, RN  Anti-Embolism Device Status: (PO anticaogulation - elequis) --  Taken 10/01/2023 1200 by Marland Silvas, RN  Anti-Embolism Device Status: (PO anticaogulation - elequis) --  Taken 10/01/2023 1000 by Marland Silvas, RN  Anti-Embolism Device Status: (PO anticaogulation - elequis) --  Taken 10/01/2023 0800 by Marland Silvas, RN  Anti-Embolism  Device Status: (PO anticaogulation - elequis) --  Intervention: Prevent Infection  Recent Flowsheet Documentation  Taken 10/01/2023 0800 by Marland Silvas, RN  Infection Prevention:   hand hygiene promoted   personal protective equipment utilized   single patient room provided  Goal: Optimal Comfort and Wellbeing  Outcome: Ongoing - Unchanged  Goal: Readiness for Transition of Care  Outcome: Ongoing - Unchanged  Goal: Rounds/Family Conference  Outcome: Ongoing - Unchanged     Problem: Fall Injury Risk  Goal: Absence of Fall and Fall-Related Injury  Outcome: Ongoing - Unchanged  Intervention: Promote Injury-Free Environment  Recent Flowsheet Documentation  Taken 10/01/2023 0800 by Marland Silvas, RN  Safety Interventions:   aspiration precautions   bed alarm   commode/urinal/bedpan at bedside   enteral feeding safety   infection management   lighting adjusted for tasks/safety   low bed   muscle strengthening facilitated   nonskid shoes/slippers when out of bed     Problem: Wound  Goal: Optimal Coping  Outcome: Ongoing - Unchanged  Goal: Optimal Functional Ability  Outcome: Ongoing - Unchanged  Intervention: Optimize Functional Ability  Recent Flowsheet Documentation  Taken 10/01/2023 0800 by Marland Silvas, RN  Activity Management: bedrest  Goal: Absence of Infection Signs and Symptoms  Outcome: Ongoing - Unchanged  Intervention: Prevent or Manage Infection  Recent Flowsheet Documentation  Taken 10/01/2023 0800 by Marland Silvas, RN  Infection Management: aseptic technique maintained  Goal: Improved Oral Intake  Outcome: Ongoing - Unchanged  Goal: Optimal Pain Control and Function  Outcome: Ongoing - Unchanged  Goal: Skin Health and Integrity  Outcome: Ongoing - Unchanged  Intervention: Optimize Skin Protection  Recent Flowsheet Documentation  Taken 10/01/2023 1800 by Marland Silvas, RN  Pressure Reduction Techniques:   heels elevated off bed   weight shift assistance provided  Taken 10/01/2023 1600 by Marland Silvas, RN  Pressure Reduction Techniques:   heels elevated off bed   weight shift assistance provided  Head of Bed [facility]) Positioning: HOB at 60 degrees  Taken 10/01/2023 1400 by Marland Silvas, RN  Pressure Reduction Techniques:   heels elevated off bed   weight shift assistance provided  Taken 10/01/2023 1200 by Marland Silvas, RN  Pressure Reduction Techniques:   heels elevated off bed   weight shift assistance provided  Head of Bed [facility]) Positioning: HOB at 60 degrees  Taken 10/01/2023 1000 by Marland Silvas, RN  Pressure Reduction Techniques:   heels elevated off bed   weight shift assistance provided  Taken 10/01/2023 0800 by Marland Silvas, RN  Activity Management: bedrest  Pressure Reduction Techniques:   heels elevated off bed   weight shift assistance provided  Head of Bed (HOB) Positioning: HOB at 60 degrees  Pressure Reduction Devices:   heel offloading device utilized   positioning supports utilized   pressure-redistributing mattress utilized   elbow protectors utilized  Skin Protection:   adhesive use limited   cleansing with dimethicone incontinence wipes   incontinence pads utilized   pulse oximeter probe site changed   silicone foam dressing in place   transparent dressing maintained   tubing/devices free from skin contact  Goal: Optimal Wound Healing  Outcome: Ongoing - Unchanged     Problem: Skin Injury Risk Increased  Goal: Skin Health and Integrity  Outcome: Ongoing - Unchanged  Intervention: Optimize Skin Protection  Recent Flowsheet Documentation  Taken 10/01/2023 1800 by Marland Silvas, RN  Pressure Reduction Techniques:   heels  elevated off bed   weight shift assistance provided  Taken 10/01/2023 1600 by Marland Silvas, RN  Pressure Reduction Techniques:   heels elevated off bed   weight shift assistance provided  Head of Bed [facility]) Positioning: HOB at 60 degrees  Taken 10/01/2023 1400 by Marland Silvas, RN  Pressure Reduction Techniques:   heels elevated off bed   weight shift assistance provided  Taken 10/01/2023 1200 by Marland Silvas, RN  Pressure Reduction Techniques:   heels elevated off bed   weight shift assistance provided  Head of Bed [facility]) Positioning: HOB at 60 degrees  Taken 10/01/2023 1000 by Marland Silvas, RN  Pressure Reduction Techniques:   heels elevated off bed   weight shift assistance provided  Taken 10/01/2023 0800 by Marland Silvas, RN  Activity Management: bedrest  Pressure Reduction Techniques:   heels elevated off bed   weight shift assistance provided  Head of Bed (HOB) Positioning: HOB at 60 degrees  Pressure Reduction Devices:   heel offloading device utilized   positioning supports utilized   pressure-redistributing mattress utilized   elbow protectors utilized  Skin Protection:   adhesive use limited   cleansing with dimethicone incontinence wipes   incontinence pads utilized   pulse oximeter probe site changed   silicone foam dressing in place   transparent dressing maintained   tubing/devices free from skin contact     Problem: Self-Care Deficit  Goal: Improved Ability to Complete Activities of Daily Living  Outcome: Ongoing - Unchanged     Problem: Comorbidity Management  Goal: Blood Pressure in Desired Range  Outcome: Ongoing - Unchanged
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
## 2023-10-02 MED ADMIN — polyethylene glycol (MIRALAX) packet 17 g: 17 g | GASTROENTERAL | @ 01:00:00 | Stop: 2023-10-01

## 2023-10-02 MED ADMIN — apixaban (ELIQUIS) tablet 5 mg: 5 mg | GASTROENTERAL | @ 01:00:00 | Stop: 2023-10-01

## 2023-10-02 MED ADMIN — oxyCODONE (ROXICODONE) immediate release tablet 2.5 mg: 2.5 mg | GASTROENTERAL | @ 01:00:00 | Stop: 2023-10-01

## 2023-10-02 MED ADMIN — albuterol 2.5 mg /3 mL (0.083 %) nebulizer solution 2.5 mg: 2.5 mg | RESPIRATORY_TRACT | @ 02:00:00

## 2023-10-02 MED ADMIN — ciprofloxacin HCl (CILOXAN) 0.3 % ophthalmic solution 4 drop: 4 [drp] | OTIC | @ 01:00:00 | Stop: 2023-10-01

## 2023-10-02 MED ADMIN — morphine 4 mg/mL injection 4 mg: 4 mg | INTRAVENOUS | @ 04:00:00 | Stop: 2023-10-01

## 2023-10-02 MED ADMIN — morphine 2 mg/mL injection: @ 04:00:00 | Stop: 2023-10-01

## 2023-10-02 MED ADMIN — prednisoLONE acetate (PRED FORTE) 1 % ophthalmic suspension 4 drop: 4 [drp] | OTIC | @ 01:00:00 | Stop: 2023-10-01

## 2023-10-02 MED ADMIN — ipratropium-albuterol (DUO-NEB) 0.5-2.5 mg/3 mL nebulizer solution 3 mL: 3 mL | RESPIRATORY_TRACT | @ 01:00:00

## 2023-10-02 MED ADMIN — senna (SENOKOT) tablet 2 tablet: 2 | GASTROENTERAL | @ 01:00:00 | Stop: 2023-10-01

## 2023-10-02 NOTE — Unmapped (Signed)
 Physician Discharge Summary [facility]  [facility]  [address]  [place] [address]  Dept: [phone]  Loc: [phone]     Identifying Information:   Adam Hoover  Adam Hoover 02, 1947  [id number]    Primary Care Physician: Adam Pastor, MD     Code Status: Comfort Measures - DNR Comfort Care    Admit Date: 09/29/2023    Discharge Date: 2023/10/16     Discharge To: Deceased -  Adam Hoover had a pronouncement of death 10/16/2023 and the time of death is 12:19 AM . The pronouncement of death was made by Adam Hoover 29-Oct-2023.   The events prior to death were aspiration, acute hypoxic respiratory failure.  The parties present at time of death was/were family and physicians. The patient's HCPOA was notified of the death. This case was not referred to the medical examiner. An autopsy was declined.    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 *      Ho[facility] Course:   Adam Hoover is a 78 y.o. male with PMH metastatic prostate CA, CKD 3b, HTN, on PE, pAF, and lymphedema that presented to [facility] s/p fall with c/f urosepsis. Course unfortunately complicated by large aspiration event on 5/3 leading to acute respiratory failure and acidosis.  After extensive discussion with family regarding patient's poor prognosis and wishes to not be intubated, the patient's daughter and healthcare decision-maker elected to pursue comfort focused care.    Acute Hypoxic Respiratory Failure  Patient originally presented to the [facility] on room air however quickly transition to nasal cannula for desaturations of unclear origin.  Chest x-ray obtained on 5/2 was not concerning for pneumonia.  CT abdomen and pelvis from 5/4 demonstrated consolidations in the right middle lobe concerning for pneumonia.  Patient was started on vancomycin and cefepime for pneumonia.  Unfortunately, his course was complicated by an aspiration event on 5/4 leading to acute hypoxic respiratory failure and acidosis with pH to 6.9.  Trialed BiPAP however patient was unable to tolerate due to continued  aspiration.  Importantly, patient was DNI and confirmed these wishes with the patient's daughter.  Extensive discussion with patient's daughter on 5/4 - 5/5 and elected to pursue comfort focused care for the patient given poor prognosis and limited options for treating acute hypoxic respiratory failure.    Septic Shock   Patient initially presented meeting SIRS criteria with hypothermia, tachycardia.  His UA on admission was notable  for large leukocyte esterase, 162 WBCs, many bacteria.  His urine was also noted to be very dark.  He received sepsis dose fluids however his blood pressure remained low and he required pressors to maintain normotension.  He was ultimately started on vancomycin and cefepime.  Norepinephrine was weaned down after patient pursued comfort care.    AKI on CKD  Patient follows with Adam [facility] at [facility].  Unclear etiology of CKD.  Suspicion of combination of factors including longstanding history of hypertension, dilated colon increasing intra-abdominal pressure, and multiple AKI's in the past leading to unilateral kidney atrophy.  His creatinine on admission was 4.21 from a baseline of 2-3.  Creatinine steadily improved to 2.81.  Suspect AKI was prerenal.    Otitis Externa  L ear drainage noted on exam, culture sent. No symptoms per patient. Aerobic/anaerobic culture growing 3+ GNRs and 2+ GPCs. Started on Ciprodex drops.     Chronic Colonic Dilatation   Follows with Adam [facility]. CXR from 5/3 PM concerning for extraluminal air and demonstrated significant colonic dilatation. Obtained CT A/P which demonstrated slight interval increase in gaseous dilation of the colon which Adam Hoover be seen in the setting of adynamic ileus. Chronic finding for patient. Per last outpatient Hoover note 08/2023, continued with bowel regimen.     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 Merilynn [facility] with [facility]. Started on ADT in 09/2020. Appears disease was relatively stable but PSA Adam [facility] in 07/2023. Management has been complicated by worsening CKD of unknown etiology. Imaging on admission showed innumerable osteolytic lesions throughout both the axial and appendicular skeletons. CT C-Spine also showed a partially visualized destructive lesion involving the T3 vertebral body with adjacent soft tissue component and likely some degree of epidural extension. Patient unable to lie flat for MRI 2/2 secretions. His Adam Hoover 300 mg BID was continued throughout his admission.   The patient's Hoover stay has been complicated by the following clinically significant conditions requiring additional evaluation and treatment or having a significant effect of this patient's care: - Malnutrition POA requiring further investigation, treatment, or monitoring  - Anemia POA requiring further investigation or monitoring  - Chronic kidney disease POA requiring further investigation, treatment, or monitoring  - Acidosis POA requiring further investigation, treatment, or monitoring             Procedures:  arterial line and internal jugular line  ______________________________________________________________________    Allergies:  Acetaminophen, Penicillins, and Lisinopril  ______________________________________________________________________  Pending Test Results:  Pending Labs       Order Current Status    Aldosterone In process    Renin Assay In process    Aerobic/Anaerobic Culture Preliminary result    Blood Culture #1 Preliminary result    Blood Culture #2 Preliminary result    Lower Respiratory Culture Preliminary result    Urine Culture Preliminary result            Most Recent Labs:  All lab results last 24 hours -   Recent Results (from the past 24 hours)   Comprehensive Metabolic Panel    Collection Time: 10/01/23  3:07 AM   Result Value Ref Range    Sodium 141 135 - 145 mmol/L    Potassium 4.9 (H) 3.4 - 4.8 mmol/L    Chloride 112 (H) 98 - 107 mmol/L    CO2 19.0 (L) 20.0 - 31.0 mmol/L    Anion Gap 10 5 - 14 mmol/L    BUN 59 (H)  9 - 23 mg/dL    Creatinine 2.84 (H) 0.73 - 1.18 mg/dL    BUN/Creatinine Ratio 19     eGFR CKD-EPI (2021) Male 20 (L) >=60 mL/min/1.78m2    Glucose 121 70 - 179 mg/dL    Calcium 8.8 8.7 - 13.2 mg/dL    Albumin 1.8 (L) 3.4 - 5.0 g/dL    Total Protein 6.8 5.7 - 8.2 g/dL    Total Bilirubin <4.4 (L) 0.3 - 1.2 mg/dL    AST 39 (H) <=01 U/L    ALT 16 10 - 49 U/L    Alkaline Phosphatase 101 46 - 116 U/L   Magnesium Level    Collection Time: 10/01/23  3:07 AM   Result Value Ref Range    Magnesium 2.1 1.6 - 2.6 mg/dL   Phosphorus Level    Collection Time: 10/01/23  3:07 AM   Result Value Ref Range    Phosphorus 4.1 2.4 - 5.1 mg/dL   CBC w/ Differential    Collection Time: 10/01/23  3:07 AM   Result Value Ref Range    WBC 7.4 3.6 - 11.2 10*9/L    RBC 3.51 (L) 4.26 - 5.60 10*12/L    HGB 9.8 (L) 12.9 - 16.5 g/dL    HCT 02.7 (L) 25.3 - 48.0 %    MCV 87.3 77.6 - 95.7 fL    MCH 28.0 25.9 - 32.4 pg    MCHC 32.0 32.0 - 36.0 g/dL    RDW 66.4 (H) 40.3 - 15.2 %    MPV 7.9 6.8 - 10.7 fL    Platelet 509 (H) 150 - 450 10*9/L    Neutrophils % 72.8 %    Lymphocytes % 13.7 %    Monocytes % 11.5 %    Eosinophils % 1.3 %    Basophils % 0.7 %    Absolute Neutrophils 5.4 1.8 - 7.8 10*9/L    Absolute Lymphocytes 1.0 (L) 1.1 - 3.6 10*9/L    Absolute Monocytes 0.9 (H) 0.3 - 0.8 10*9/L    Absolute Eosinophils 0.1 0.0 - 0.5 10*9/L    Absolute Basophils 0.1 0.0 - 0.1 10*9/L   Blood Gas Critical Care Panel, Arterial    Collection Time: 10/01/23  3:19 AM   Result Value Ref Range    Specimen Source Arterial     FIO2 Arterial Not Specified     pH, Arterial 7.22 (L) 7.35 - 7.45    pCO2, Arterial 43.3 35.0 - 45.0 mm Hg    pO2, Arterial 163.0 (H) 80.0 - 110.0 mm Hg    HCO3 (Bicarbonate), Arterial 17 (L) 22 - 27 mmol/L    Base Excess, Arterial -9.2 (L) -2.0 - 2.0    O2 Sat, Arterial 98.9 94.0 - 100.0 %    Sodium Whole Blood 140 135 - 145 mmol/L    Potassium, Bld 4.8 (H) 3.4 - 4.6 mmol/L    Calcium, Ionized Arterial 5.43 (H) 4.40 - 5.40 mg/dL    Glucose Whole Blood 120 70 - 179 mg/dL    Lactate, Arterial 0.5 <1.3 mmol/L    Hgb, blood gas 9.50 (L) 13.50 - 17.50 g/dL   ECG 12 Lead    Collection Time: 10/01/23  1:41 PM   Result Value Ref Range    EKG Systolic BP  mmHg    EKG Diastolic BP  mmHg    EKG Ventricular Rate 80 BPM    EKG Atrial Rate 80 BPM    EKG P-R Interval 156 ms    EKG  QRS Duration 84 ms    EKG Q-T Interval 376 ms    EKG QTC Calculation 433 ms    EKG Calculated P Axis 64 degrees    EKG Calculated R Axis -13 degrees    EKG Calculated T Axis 17 degrees    QTC Fredericia 414 ms   hsTroponin I (single, no delta)    Collection Time: 10/01/23  2:24 PM   Result Value Ref Range    hsTroponin I 17 <=53 ng/L   Pro-BNP    Collection Time: 10/01/23  9:08 PM   Result Value Ref Range    PRO-BNP 2,392.0 (H) <=300.0 pg/mL   hsTroponin I (single, no delta)    Collection Time: 10/01/23  9:08 PM   Result Value Ref Range    hsTroponin I 15 <=53 ng/L   Blood Gas Critical Care Panel, Arterial    Collection Time: 10/01/23  9:08 PM   Result Value Ref Range    Specimen Source Arterial     FIO2 Arterial Not Specified     pH, Arterial 7.17 (LL) 7.35 - 7.45    pCO2, Arterial 55.8 (H) 35.0 - 45.0 mm Hg    pO2, Arterial 233.0 (H) 80.0 - 110.0 mm Hg    HCO3 (Bicarbonate), Arterial 20 (L) 22 - 27 mmol/L    Base Excess, Arterial -7.5 (L) -2.0 - 2.0    O2 Sat, Arterial 99.2 94.0 - 100.0 %    Sodium Whole Blood 140 135 - 145 mmol/L    Potassium, Bld 4.9 (H) 3.4 - 4.6 mmol/L    Calcium, Ionized Arterial 5.60 (H) 4.40 - 5.40 mg/dL    Glucose Whole Blood 124 70 - 179 mg/dL    Lactate, Arterial 0.5 <1.3 mmol/L    Hgb, blood gas 10.80 (L) 13.50 - 17.50 g/dL   Respiratory Pathogen Panel    Collection Time: 10/01/23  9:16 PM   Result Value Ref Range    Adenovirus Not Detected Not Detected    Coronavirus HKU1 Not Detected Not Detected    Coronavirus NL63 Not Detected Not Detected    Coronavirus 229E Not Detected Not Detected    Coronavirus OC43 PCR Not Detected Not Detected    Metapneumovirus Not Detected Not Detected    Rhinovirus/Enterovirus Not Detected Not Detected    Influenza A Not Detected Not Detected    Influenza B Not Detected Not Detected    Parainfluenza 1 Not Detected Not Detected    Parainfluenza 2 Not Detected Not Detected    Parainfluenza 3 Not Detected Not Detected    Parainfluenza 4 Not Detected Not Detected    RSV Not Detected Not Detected    Bordetella pertussis Not Detected Not Detected    Bordetella parapertussis Not Detected Not Detected    Chlamydophila (Chlamydia) pneumoniae Not Detected Not Detected    Mycoplasma pneumoniae Not Detected Not Detected    SARS-CoV-2 PCR Not Detected Not Detected   Carboxyhemoglobin/POC    Collection Time: 10/01/23 10:18 PM   Result Value Ref Range    Carboxyhemoglobin <1.0 <1.2 %   GLUCOSE-BG/POC    Collection Time: 10/01/23 10:18 PM   Result Value Ref Range    Glucose Whole Blood 99 70 - 179 mg/dL   POCT Arterial Blood Gas    Collection Time: 10/01/23 10:18 PM   Result Value Ref Range    pH, Arterial 7.07 (LL) 7.35 - 7.45    pCO2, Arterial 66.8 (HH) 35.0 - 45.0 mm[Hg]    pO2, Arterial 72 (L)  80 - 110 mm[Hg]    HCO3 (Bicarbonate), Arterial 19.5 (L) 22.0 - 27.0 mmol/L    Base Excess, Arterial -10.6 (L) -2.0 - 2.0 mmol/L    O2 Sat, Arterial 87.8 (L) 94.0 - 100.0 %    Specimen Type, Arterial Arterial     FIO2 100 %   HEMOGLOBIN BG/POC    Collection Time: 10/01/23 10:18 PM   Result Value Ref Range    Hemoglobin 10.6 (L) 13.5 - 17.5 g/dL   POCT Ionized Calcium    Collection Time: 10/01/23 10:18 PM   Result Value Ref Range    Ionized Calcium 5.1 4.4 - 5.4 mg/dL   POTASSIUM-BG/POC    Collection Time: 10/01/23 10:18 PM   Result Value Ref Range    Potassium, Bld 4.7 (H) 3.4 - 4.6 mmol/L   POCT Lactic Acid (Lactate), Arterial    Collection Time: 10/01/23 10:18 PM   Result Value Ref Range    Lactate, Arterial 0.3 <1.3 mmol/L   METHEMOGLOBIN/POC    Collection Time: 10/01/23 10:18 PM   Result Value Ref Range    Methemoglobin <1.0 <1.5 %   SODIUM-BG/POC    Collection Time: 10/01/23 10:18 PM   Result Value Ref Range    Sodium Whole Blood 143 135 - 145 mmol/L   POCT Arterial Oxyhemoglobin    Collection Time: 10/01/23 10:18 PM   Result Value Ref Range    Oxyhemoglobin 86.2 (L) 94.0 - 100.0 %   Blood Gas Critical Care Panel, Arterial    Collection Time: 10/01/23 10:53 PM   Result Value Ref Range    Specimen Source Arterial     FIO2 Arterial Not Specified     pH, Arterial 6.94 (LL) 7.35 - 7.45    pCO2, Arterial >100.0 (HH) 35.0 - 45.0 mm Hg    pO2, Arterial 208.0 (H) 80.0 - 110.0 mm Hg    HCO3 (Bicarbonate), Arterial      Base Excess, Arterial      O2 Sat, Arterial 98.7 94.0 - 100.0 %    Sodium Whole Blood 142 135 - 145 mmol/L    Potassium, Bld 5.2 (H) 3.4 - 4.6 mmol/L    Calcium, Ionized Arterial 5.57 (H) 4.40 - 5.40 mg/dL    Glucose Whole Blood 122 70 - 179 mg/dL    Lactate, Arterial 0.4 <1.3 mmol/L    Hgb, blood gas 12.40 (L) 13.50 - 17.50 g/dL   CBC    Collection Time: 10/01/23 10:53 PM   Result Value Ref Range    WBC 9.9 3.6 - 11.2 10*9/L    RBC 4.09 (L) 4.26 - 5.60 10*12/L    HGB 11.3 (L) 12.9 - 16.5 g/dL    HCT 09.8 (L) 11.9 - 48.0 %    MCV 90.4 77.6 - 95.7 fL    MCH 27.7 25.9 - 32.4 pg    MCHC 30.7 (L) 32.0 - 36.0 g/dL    RDW 14.7 (H) 82.9 - 15.2 %    MPV 7.1 6.8 - 10.7 fL    Platelet 559 (H) 150 - 450 10*9/L   Comprehensive Metabolic Panel    Collection Time: 10/01/23 10:53 PM   Result Value Ref Range    Sodium 143 135 - 145 mmol/L    Potassium 5.5 (H) 3.4 - 4.8 mmol/L    Chloride 112 (H) 98 - 107 mmol/L    CO2 24.0 20.0 - 31.0 mmol/L    Anion Gap 7 5 - 14 mmol/L    BUN 57 (H) 9 -  23 mg/dL    Creatinine 2.95 (H) 0.73 - 1.18 mg/dL    BUN/Creatinine Ratio 20     eGFR CKD-EPI (2021) Male 23 (L) >=60 mL/min/1.66m2    Glucose 130 70 - 179 mg/dL    Calcium 9.8 8.7 - 62.1 mg/dL    Albumin 2.1 (L) 3.4 - 5.0 g/dL    Total Protein 7.5 5.7 - 8.2 g/dL    Total Bilirubin <3.0 (L) 0.3 - 1.2 mg/dL    AST 40 (H) <=86 U/L    ALT 22 10 - 49 U/L    Alkaline Phosphatase 117 (H) 46 - 116 U/L   PT-INR    Collection Time: 10/01/23 10:53 PM   Result Value Ref Range    PT 17.4 (H) 9.9 - 12.6 sec    INR 1.53    Vancomycin, Random    Collection Time: 10/01/23 11:11 PM   Result Value Ref Range    Vancomycin Rm 25.7 Undefined ug/mL       Relevant Studies/Radiology:  XR Chest Portable  Result Date: 10/01/2023  EXAM: XR CHEST PORTABLE ACCESSION: [id number] UN REPORT DATE: 10/01/2023 10:15 PM CLINICAL INDICATION: DYSPNEA  TECHNIQUE: Single View AP Chest Radiograph. COMPARISON: Chest radiograph 09/30/2023 and prior studies FINDINGS: Unchanged support devices. Low lung volumes with bibasilar atelectasis. No pleural effusion or pneumothorax. Normal heart size and mediastinal contours.     Stable chest    ECG 12 Lead  Result Date: 10/01/2023  NORMAL SINUS RHYTHM NONSPECIFIC T WAVE FLATTENING IN LIMB LEADS WHEN COMPARED WITH ECG OF 03-Leveda Kendrix-2025 13:03, NO SIGNIFICANT CHANGE WAS FOUND Confirmed by Alica Inks [phone]) on 10/01/2023 6:29:04 PM    CT Abdomen Pelvis Wo Contrast  Result Date: 10/01/2023  EXAM: CT ABDOMEN PELVIS WO CONTRAST ACCESSION: [id number] UN REPORT DATE: 10/01/2023 1:22 PM CLINICAL INDICATION: 78 years old with r/o perforation  COMPARISON: CT abdomen pelvis 09/29/2023 TECHNIQUE: A spiral CT scan was obtained without IV contrast from the lung bases to the pubic symphysis.  Images were reconstructed in the axial plane. Coronal and sagittal reformatted images were also provided for further evaluation. Evaluation of the solid organs and vasculature is limited in the absence of intravenous contrast. FINDINGS: LOWER CHEST: Increased size of small bilateral pleural effusions with adjacent bibasilar atelectasis. Right middle lobe consolidation. Partially imaged superior approach central venous catheter with tip terminating at the upper cavoatrial junction. Aortic annular and coronary artery calcifications. LIVER: Normal liver contour. Unchanged hypoattenuating lesions measuring up to 1.9 cm (2:40), similar to prior. BILIARY: Hyperattenuating layering debris within the gallbladder lumen, likely sludge versus vicarious excretion of contrast. No intrahepatic biliary ductal dilatation. SPLEEN: Normal in size and contour. PANCREAS: Normal pancreatic contour without signs of inflammation or gross ductal dilatation. ADRENAL GLANDS: Limited evaluation secondary to motion artifact but similar to prior. KIDNEYS/URETERS: Smooth renal contours.  No nephrolithiasis. No ureteral dilatation or collecting system distention. Multiple renal cysts bilaterally, definitively characterized on MRI abdomen dated 02/12/2022 as a combination of simple and hemorrhagic/proteinaceous cysts. BLADDER:. Decompressed secondary to Foley catheter. REPRODUCTIVE ORGANS: Unremarkable. Hoover TRACT: Esophagogastric tube tip and sideport terminate within the stomach. Normal duodenal sweep. Loops of small bowel are normal in caliber. The appendix is not clearly identified but there are no secondary signs of appendicitis. Interval increased dilation of air-filled large bowel involving the transverse colon, which measures up to 10.5 cm in diameter. No discrete transition point or obstructing mass is identified. Prominent rectal stool ball with mild apparent rectal wall thickening, nonspecific and Disa Riedlinger be seen in the  setting of stercoral proctitis. PERITONEUM/RETROPERITONEUM AND MESENTERY: No free air. Small volume abdominopelvic ascites, similar to slightly increased when compared to prior. No fluid collection. VASCULATURE: Normal caliber aorta. Partially calcified atherosclerosis of the aorta and its branch vessels. Otherwise, limited evaluation without contrast. LYMPH NODES: No adenopathy. BONES and SOFT TISSUES: Moderate degenerative changes of the thoracolumbar spine. Diffuse idiopathic skeletal hyperostosis. Similar widespread sclerotic osseous metastases in this patient with history of prostate carcinoma. Small fat-containing umbilical hernia. Small bilateral fat-containing renal hernias, right greater than left. Muscle atrophy of the bilateral glutei minimi.     Compared to CT abdomen pelvis from 2 days prior: -Slight interval increase in gaseous dilation of the colon extending proximally to involve the ascending colon and cecum which Lydon Vansickle be seen in the setting of adynamic ileus. No obvious transition point or obstructing mass is identified. -Similar to slight increase in abdominopelvic ascites, nonspecific and Jaskaran Dauzat be reactive (given the above). -Similar diffuse sclerotic osseous metastatic disease. -Increased small bilateral pleural effusions with adjacent atelectasis. Consolidation within the right middle lobe Omair Dettmer be secondary to aspiration. Consider further evaluation with dedicated CT chest for more definitive characterization.     XR Abdomen 1 View  Result Date: 10/01/2023  EXAM: XR ABDOMEN 1 VIEW ACCESSION: [id number] UN REPORT DATE: 10/01/2023 1:23 PM CLINICAL INDICATION: 78 years old with NGT (CATHETER VASCULAR FIT & ADJ)  COMPARISON: CT dated Gwendlyon Zumbro 4, 2025 TECHNIQUE: Supine view of the abdomen, 2 image(s) FINDINGS: Marked distention of the colon better appreciated on same day CT. Small bowel is not significantly distended. Esophagogastric tube is present with the tip and sideport overlying the gastric body. No free air. Blunting of the right costophrenic angle suggests pleural effusion with passive atelectasis and/or infiltrate right lung base.     Esophagogastric tube is present with the tip and sideport overlying the gastric body. Persistent marked gas and stool filled loops of colon included in the field-of-view. This is better assessed on same-day CT.     XR Chest 1 view Portable  Result Date: 09/30/2023  EXAM: XR CHEST PORTABLE ACCESSION: [id number] UN REPORT DATE: 09/30/2023 8:01 PM CLINICAL INDICATION: LINE CHECK (CATHETER VASCULAR FIT)  TECHNIQUE: Single View AP Chest Radiograph. COMPARISON: Chest radiograph 09/29/2023. FINDINGS: Right IJV approach CVC with its tip projecting over the distal SVC/superior cavoatrial junction. Lungs severely hypoinflated. Increased bilateral medial basilar airspace disease.Aaron Aas  No pleural effusion or pneumothorax. Normal heart size and mediastinal contours. Extensive sclerotic osseous metastasis.     Right superior approach central line with tip projecting near the distal SVC/superior cavoatrial junction. Increased bilateral medial basilar airspace disease, Jakyle Petrucelli represent atelectasis versus aspiration. Stable extensive sclerotic osseous metastasis    ECG 12 Lead  Result Date: 09/30/2023  SINUS TACHYCARDIA OTHERWISE NORMAL ECG WHEN COMPARED WITH ECG OF 02-Barbara Ahart-2025 23:49, PREMATURE VENTRICULAR BEATS ARE NO LONGER PRESENT Confirmed by Gladys Lamp [phone]) on 09/30/2023 4:16:50 PM    ECG 12 Lead  Result Date: 09/30/2023  SINUS RHYTHM WITH OCCASIONAL PREMATURE VENTRICULAR BEATS OTHERWISE NORMAL ECG WHEN COMPARED WITH ECG OF 02-Nilani Hugill-2025 13:15, PREMATURE VENTRICULAR BEATS ARE NOW PRESENT Confirmed by Gladys Lamp ([id number]) on 09/30/2023 9:03:04 AM    CT Abdomen Pelvis Wo Contrast  Result Date: 09/29/2023  EXAM: CT ABDOMEN PELVIS WO CONTRAST ACCESSION: [id number] UN REPORT DATE: 09/29/2023 10:20 PM CLINICAL INDICATION: 78 years old with AKI, UTI with hematuria, eval for possible infected/obstructed kidney stone  COMPARISON: CT abdomen/pelvis 08/10/2021 TECHNIQUE: A spiral CT scan was obtained without IV contrast from the lung bases to the pubic  symphysis.  Images were reconstructed in the axial plane. Coronal and sagittal reformatted images were also provided for further evaluation. Evaluation of the solid organs and vasculature is limited in the absence of intravenous contrast. FINDINGS: LOWER CHEST: Bibasilar subsegmental atelectasis. Left lower lobe volume loss, bronchiectasis, and mild dependent airspace opacities. Aortic valvular and coronary arterial calcifications. Calcified granuloma in the right middle lobe. LIVER: Normal liver contour. Unchanged hypoattenuating lesions measuring up to 1.9 cm (2:36), similar to prior. BILIARY: Vicarious excretion of contrast within the gallbladder. Gallbladder gallbladder fold no intrahepatic biliary ductal dilatation. SPLEEN: Normal in size and contour. PANCREAS: Normal pancreatic contour without signs of inflammation or gross ductal dilatation. ADRENAL GLANDS: Unchanged 1.4 cm left adrenal adenoma (2:47). Normal appearance of the right adrenal gland. KIDNEYS/URETERS: Smooth renal contours.  No nephrolithiasis. No ureteral dilatation or collecting system distention. Multiple renal cysts bilaterally, definitively characterized on MRI abdomen dated 02/12/2022 as a combination of simple and hemorrhagic/proteinaceous cysts. BLADDER:. Decompressed secondary to Foley catheter. REPRODUCTIVE ORGANS: Unremarkable. [facility] TRACT: Diffuse dilation of the colon to 11.8 cm (3:28) to the proximal descending colon, decreased in dilation since prior study. No evidence of volvulus or definite obstructing mass. Evaluation for ischemia is limited in the absence of intravenous contrast. Interposition of colon between the liver and anterior upper abdominal and lower chest walls. Normal appendix (3:48). PERITONEUM/RETROPERITONEUM AND MESENTERY: No free air. Small volume perihepatic ascites of low density. No fluid collection. VASCULATURE: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

## 2023-10-02 NOTE — Unmapped (Addendum)
 Patient exhibited continuously increased oxygen requirement, subsequently placed on HFNC then BiPAP, looped in MD and RT throughout entire process. Patient transitioned to comfort care, death pronounced by MD at 0019. MD spoke to daughter and chaplain visited daughter at bedside. Decedent care information given to daughter. [facility] contacted. Postmortem care provided and patient sent to morgue.     Problem: Adult Inpatient Plan of Care  Goal: Plan of Care Review  Outcome: Resolved  Goal: Patient-Specific Goal (Individualized)  Outcome: Resolved  Goal: Absence of Hospital-Acquired Illness or Injury  Outcome: Resolved  Intervention: Identify and Manage Fall Risk  Recent Flowsheet Documentation  Taken 10/01/2023 2000 by Dermot Gremillion, RN  Safety Interventions:   aspiration precautions   bed alarm   bleeding precautions   fall reduction program maintained   infection management   lighting adjusted for tasks/safety   low bed   commode/urinal/bedpan at bedside   environmental modification   nonskid shoes/slippers when out of bed  Intervention: Prevent Skin Injury  Recent Flowsheet Documentation  Taken 10/16/2023 0000 by Arlyne Bering, RN  Positioning for Skin: Left  Taken 10/01/2023 2200 by Arlyne Bering, RN  Positioning for Skin: Left  Taken 10/01/2023 2000 by Arlyne Bering, RN  Positioning for Skin: Right  Device Skin Pressure Protection:   absorbent pad utilized/changed   adhesive use limited   positioning supports utilized   pressure points protected   tubing/devices free from skin contact  Skin Protection:   adhesive use limited   cleansing with dimethicone incontinence wipes   incontinence pads utilized   pulse oximeter probe site changed   silicone foam dressing in place   transparent dressing maintained   tubing/devices free from skin contact  Intervention: Prevent and Manage VTE (Venous Thromboembolism) Risk  Recent Flowsheet Documentation  Taken 10-16-23 0000 by Makaylia Hewett, RN  Anti-Embolism Device Status: (eliquis) Other (Comment)  Taken 10/01/2023 2200 by Arlyne Bering, RN  Anti-Embolism Device Status: (eliquis) Other (Comment)  Taken 10/01/2023 2000 by Arlyne Bering, RN  Anti-Embolism Device Status: (eliquis) Other (Comment)  Intervention: Prevent Infection  Recent Flowsheet Documentation  Taken 10/01/2023 2000 by Torell Minder, RN  Infection Prevention:   cohorting utilized   environmental surveillance performed   equipment surfaces disinfected   hand hygiene promoted   personal protective equipment utilized   rest/sleep promoted   single patient room provided  Goal: Optimal Comfort and Wellbeing  Outcome: Resolved  Goal: Readiness for Transition of Care  Outcome: Resolved  Goal: Rounds/Family Conference  Outcome: Resolved     Problem: Fall Injury Risk  Goal: Absence of Fall and Fall-Related Injury  Outcome: Resolved  Intervention: Promote Injury-Free Environment  Recent Flowsheet Documentation  Taken 10/01/2023 2000 by Johm Pfannenstiel, RN  Safety Interventions:   aspiration precautions   bed alarm   bleeding precautions   fall reduction program maintained   infection management   lighting adjusted for tasks/safety   low bed   commode/urinal/bedpan at bedside   environmental modification   nonskid shoes/slippers when out of bed     Problem: Wound  Goal: Optimal Coping  Outcome: Resolved  Goal: Optimal Functional Ability  Outcome: Resolved  Intervention: Optimize Functional Ability  Recent Flowsheet Documentation  Taken 16-Oct-2023 0000 by Ravi Tuccillo, RN  Activity Management: bedrest  Taken 10/01/2023 2200 by Jauna Raczynski, RN  Activity Management: bedrest  Taken 10/01/2023 2000 by Jhonnie Aliano, RN  Activity Management: bedrest  Goal: Absence of Infection Signs and Symptoms  Outcome: Resolved  Intervention: Prevent or Manage  Infection  Recent Flowsheet Documentation  Taken 10/01/2023 2000 by Clarece Drzewiecki, RN  Infection Management: aseptic technique maintained  Goal: Improved Oral Intake  Outcome: Resolved  Goal: Optimal Pain Control and Function  Outcome: Resolved  Intervention: Prevent or Manage Pain  Recent Flowsheet Documentation  Taken 10/01/2023 2000 by Molleigh Huot, RN  Sleep/Rest Enhancement:   consistent schedule promoted   regular sleep/rest pattern promoted   relaxation techniques promoted   room darkened   therapeutic touch utilized  Goal: Skin Health and Integrity  Outcome: Resolved  Intervention: Optimize Skin Protection  Recent Flowsheet Documentation  Taken 2023-10-28 0000 by Stepan Verrette, RN  Activity Management: bedrest  Head of Bed [facility]) Positioning: HOB at 60 degrees  Taken 10/01/2023 2200 by Arlyne Bering, RN  Activity Management: bedrest  Head of Bed [facility]) Positioning: HOB at 60 degrees  Taken 10/01/2023 2000 by Tonnette Zwiebel, RN  Activity Management: bedrest  Pressure Reduction Techniques:   heels elevated off bed   weight shift assistance provided   frequent weight shift encouraged   pressure points protected  Head of Bed (HOB) Positioning: HOB at 60 degrees  Pressure Reduction Devices:   foam padding utilized   heel offloading device utilized   positioning supports utilized   pressure-redistributing mattress utilized   specialty bed utilized  Skin Protection:   adhesive use limited   cleansing with dimethicone incontinence wipes   incontinence pads utilized   pulse oximeter probe site changed   silicone foam dressing in place   transparent dressing maintained   tubing/devices free from skin contact  Goal: Optimal Wound Healing  Outcome: Resolved  Intervention: Promote Wound Healing  Recent Flowsheet Documentation  Taken 10/01/2023 2000 by Mayara Paulson, RN  Sleep/Rest Enhancement:   consistent schedule promoted   regular sleep/rest pattern promoted   relaxation techniques promoted   room darkened   therapeutic touch utilized     Problem: Skin Injury Risk Increased  Goal: Skin Health and Integrity  Outcome: Resolved  Intervention: Optimize Skin Protection  Recent Flowsheet Documentation  Taken 2023-10-28 0000 by Virgie Chery, RN  Activity Management: bedrest  Head of Bed [facility]) Positioning: HOB at 60 degrees  Taken 10/01/2023 2200 by Arlyne Bering, RN  Activity Management: bedrest  Head of Bed [facility]) Positioning: HOB at 60 degrees  Taken 10/01/2023 2000 by Dalan Cowger, RN  Activity Management: bedrest  Pressure Reduction Techniques:   heels elevated off bed   weight shift assistance provided   frequent weight shift encouraged   pressure points protected  Head of Bed (HOB) Positioning: HOB at 60 degrees  Pressure Reduction Devices:   foam padding utilized   heel offloading device utilized   positioning supports utilized   pressure-redistributing mattress utilized   specialty bed utilized  Skin Protection:   adhesive use limited   cleansing with dimethicone incontinence wipes   incontinence pads utilized   pulse oximeter probe site changed   silicone foam dressing in place   transparent dressing maintained   tubing/devices free from skin contact     Problem: Self-Care Deficit  Goal: Improved Ability to Complete Activities of Daily Living  Outcome: Resolved     Problem: Comorbidity Management  Goal: Blood Pressure in Desired Range  Outcome: Resolved

## 2023-10-05 LAB — ALDOSTERONE: ALDOSTERONE: 8.5 ng/dL

## 2023-10-08 LAB — RENIN ASSAY: RENIN PLASMA: 8.7 ng/mL/h

## 2023-10-29 DEATH — deceased

## 2024-01-24 NOTE — Unmapped (Signed)
 error

## 2024-07-05 ENCOUNTER — Encounter (INDEPENDENT_AMBULATORY_CARE_PROVIDER_SITE_OTHER): Payer: Self-pay | Admitting: *Deleted
